# Patient Record
Sex: Male | Born: 1935 | Race: White | Hispanic: No | Marital: Married | Smoking: Former smoker
Health system: Southern US, Community
[De-identification: ages and names within clinical notes are randomized; demographics above are authoritative.]

## PROBLEM LIST (undated history)

## (undated) DIAGNOSIS — I10 Essential (primary) hypertension: Secondary | ICD-10-CM

## (undated) DIAGNOSIS — I4891 Unspecified atrial fibrillation: Secondary | ICD-10-CM

## (undated) DIAGNOSIS — E119 Type 2 diabetes mellitus without complications: Secondary | ICD-10-CM

## (undated) DIAGNOSIS — N400 Enlarged prostate without lower urinary tract symptoms: Secondary | ICD-10-CM

## (undated) HISTORY — DX: Essential (primary) hypertension: I10

## (undated) HISTORY — PX: PACEMAKER INSERTION: SHX728

## (undated) HISTORY — PX: TOE AMPUTATION: SHX809

## (undated) HISTORY — DX: Type 2 diabetes mellitus without complications: E11.9

## (undated) HISTORY — PX: JOINT REPLACEMENT: SHX530

---

## 2004-10-29 ENCOUNTER — Ambulatory Visit: Payer: Self-pay

## 2004-11-05 ENCOUNTER — Ambulatory Visit: Payer: Self-pay | Admitting: Specialist

## 2004-11-12 ENCOUNTER — Ambulatory Visit: Payer: Self-pay | Admitting: Specialist

## 2004-11-23 ENCOUNTER — Emergency Department: Payer: Self-pay | Admitting: Urology

## 2004-12-02 ENCOUNTER — Emergency Department: Payer: Self-pay | Admitting: Urology

## 2004-12-11 ENCOUNTER — Emergency Department: Payer: Self-pay | Admitting: Urology

## 2004-12-31 ENCOUNTER — Ambulatory Visit: Payer: Self-pay | Admitting: Specialist

## 2005-01-17 ENCOUNTER — Ambulatory Visit: Payer: Self-pay | Admitting: Gastroenterology

## 2008-03-06 ENCOUNTER — Ambulatory Visit: Payer: Self-pay | Admitting: Vascular Surgery

## 2008-05-31 ENCOUNTER — Ambulatory Visit: Payer: Self-pay | Admitting: Unknown Physician Specialty

## 2008-08-23 ENCOUNTER — Ambulatory Visit: Payer: Self-pay | Admitting: Family Medicine

## 2010-05-11 ENCOUNTER — Emergency Department: Payer: Self-pay | Admitting: Emergency Medicine

## 2010-08-05 ENCOUNTER — Ambulatory Visit: Payer: Self-pay | Admitting: Podiatry

## 2011-02-13 ENCOUNTER — Emergency Department: Payer: Self-pay | Admitting: *Deleted

## 2011-04-07 ENCOUNTER — Ambulatory Visit: Payer: Self-pay | Admitting: Vascular Surgery

## 2011-08-22 ENCOUNTER — Inpatient Hospital Stay: Payer: Self-pay | Admitting: Internal Medicine

## 2011-08-22 ENCOUNTER — Other Ambulatory Visit: Payer: Self-pay | Admitting: Podiatry

## 2011-08-22 LAB — BASIC METABOLIC PANEL
BUN: 27 mg/dL — ABNORMAL HIGH (ref 7–18)
Chloride: 98 mmol/L (ref 98–107)
Osmolality: 281 (ref 275–301)
Potassium: 4 mmol/L (ref 3.5–5.1)
Sodium: 137 mmol/L (ref 136–145)

## 2011-08-22 LAB — CBC WITH DIFFERENTIAL/PLATELET
Basophil #: 0 10*3/uL (ref 0.0–0.1)
Basophil %: 0 %
Eosinophil #: 0 10*3/uL (ref 0.0–0.7)
Eosinophil %: 0 %
Lymphocyte #: 0.3 10*3/uL — ABNORMAL LOW (ref 1.0–3.6)
MCHC: 33.8 g/dL (ref 32.0–36.0)
MCV: 86 fL (ref 80–100)
Monocyte %: 11.9 %
Neutrophil #: 13.4 10*3/uL — ABNORMAL HIGH (ref 1.4–6.5)
Neutrophil %: 85.9 %
Platelet: 252 10*3/uL (ref 150–440)
RDW: 15.6 % — ABNORMAL HIGH (ref 11.5–14.5)
WBC: 15.6 10*3/uL — ABNORMAL HIGH (ref 3.8–10.6)

## 2011-08-23 LAB — CBC WITH DIFFERENTIAL/PLATELET
Basophil #: 0 10*3/uL (ref 0.0–0.1)
Eosinophil #: 0 10*3/uL (ref 0.0–0.7)
Eosinophil %: 0.2 %
Lymphocyte #: 0.2 10*3/uL — ABNORMAL LOW (ref 1.0–3.6)
MCH: 28.6 pg (ref 26.0–34.0)
MCV: 86 fL (ref 80–100)
Monocyte #: 0.9 10*3/uL — ABNORMAL HIGH (ref 0.0–0.7)
Neutrophil #: 12.3 10*3/uL — ABNORMAL HIGH (ref 1.4–6.5)
Neutrophil %: 91.6 %
Platelet: 278 10*3/uL (ref 150–440)
RDW: 15.8 % — ABNORMAL HIGH (ref 11.5–14.5)
WBC: 13.4 10*3/uL — ABNORMAL HIGH (ref 3.8–10.6)

## 2011-08-23 LAB — HEPATIC FUNCTION PANEL A (ARMC)
Albumin: 1.1 g/dL — ABNORMAL LOW (ref 3.4–5.0)
Alkaline Phosphatase: 40 U/L — ABNORMAL LOW (ref 50–136)
SGOT(AST): 23 U/L (ref 15–37)
SGPT (ALT): 16 U/L
Total Protein: 4.8 g/dL — ABNORMAL LOW (ref 6.4–8.2)

## 2011-08-23 LAB — BASIC METABOLIC PANEL
Anion Gap: 10 (ref 7–16)
Co2: 30 mmol/L (ref 21–32)
Creatinine: 1.19 mg/dL (ref 0.60–1.30)
EGFR (African American): >60
Potassium: 3.4 mmol/L — ABNORMAL LOW (ref 3.5–5.1)
Sodium: 137 mmol/L (ref 136–145)

## 2011-08-24 LAB — CBC WITH DIFFERENTIAL/PLATELET
Basophil #: 0 10*3/uL (ref 0.0–0.1)
Basophil %: 0 %
Eosinophil %: 0.2 %
HCT: 31.1 % — ABNORMAL LOW (ref 40.0–52.0)
HGB: 10.2 g/dL — ABNORMAL LOW (ref 13.0–18.0)
Lymphocyte #: 0.2 10*3/uL — ABNORMAL LOW (ref 1.0–3.6)
Lymphocyte %: 1.4 %
MCH: 28.2 pg (ref 26.0–34.0)
MCHC: 32.7 g/dL (ref 32.0–36.0)
MCV: 86 fL (ref 80–100)
Monocyte #: 0.8 10*3/uL — ABNORMAL HIGH (ref 0.0–0.7)
Monocyte %: 5.3 %
Neutrophil %: 93.1 %
RBC: 3.61 10*6/uL — ABNORMAL LOW (ref 4.40–5.90)

## 2011-08-24 LAB — BASIC METABOLIC PANEL
Anion Gap: 11 (ref 7–16)
Calcium, Total: 7.8 mg/dL — ABNORMAL LOW (ref 8.5–10.1)
Co2: 31 mmol/L (ref 21–32)
Creatinine: 1.33 mg/dL — ABNORMAL HIGH (ref 0.60–1.30)
EGFR (African American): >60
EGFR (Non-African Amer.): 56 — ABNORMAL LOW
Glucose: 108 mg/dL — ABNORMAL HIGH (ref 65–99)
Osmolality: 281 (ref 275–301)

## 2011-08-24 LAB — VANCOMYCIN, TROUGH: Vancomycin, Trough: 11 ug/mL (ref 10–20)

## 2011-08-25 LAB — CBC WITH DIFFERENTIAL/PLATELET
Basophil #: 0 10*3/uL (ref 0.0–0.1)
Basophil %: 0.4 %
Eosinophil #: 0.1 10*3/uL (ref 0.0–0.7)
Eosinophil %: 0.7 %
HGB: 9.9 g/dL — ABNORMAL LOW (ref 13.0–18.0)
Lymphocyte #: 0.3 10*3/uL — ABNORMAL LOW (ref 1.0–3.6)
MCH: 28.3 pg (ref 26.0–34.0)
MCHC: 33.2 g/dL (ref 32.0–36.0)
MCV: 86 fL (ref 80–100)
Monocyte #: 1 10*3/uL — ABNORMAL HIGH (ref 0.0–0.7)
Neutrophil %: 89.2 %
Platelet: 324 10*3/uL (ref 150–440)
RBC: 3.5 10*6/uL — ABNORMAL LOW (ref 4.40–5.90)
RDW: 15.7 % — ABNORMAL HIGH (ref 11.5–14.5)

## 2011-08-25 LAB — BASIC METABOLIC PANEL
Anion Gap: 10 (ref 7–16)
BUN: 24 mg/dL — ABNORMAL HIGH (ref 7–18)
Co2: 31 mmol/L (ref 21–32)
EGFR (African American): >60
EGFR (Non-African Amer.): 56 — ABNORMAL LOW
Osmolality: 280 (ref 275–301)
Potassium: 3.4 mmol/L — ABNORMAL LOW (ref 3.5–5.1)

## 2011-08-26 LAB — BASIC METABOLIC PANEL
Anion Gap: 10 (ref 7–16)
BUN: 21 mg/dL — ABNORMAL HIGH (ref 7–18)
Chloride: 95 mmol/L — ABNORMAL LOW (ref 98–107)
Co2: 31 mmol/L (ref 21–32)
Creatinine: 1.21 mg/dL (ref 0.60–1.30)
EGFR (Non-African Amer.): >60
Osmolality: 276 (ref 275–301)
Potassium: 3.4 mmol/L — ABNORMAL LOW (ref 3.5–5.1)
Sodium: 136 mmol/L (ref 136–145)

## 2011-08-26 LAB — CBC WITH DIFFERENTIAL/PLATELET
Basophil #: 0 10*3/uL (ref 0.0–0.1)
Eosinophil %: 1.4 %
HCT: 29.2 % — ABNORMAL LOW (ref 40.0–52.0)
Lymphocyte #: 0.4 10*3/uL — ABNORMAL LOW (ref 1.0–3.6)
Lymphocyte %: 3.2 %
MCHC: 33 g/dL (ref 32.0–36.0)
MCV: 86 fL (ref 80–100)
Monocyte %: 7.1 %
Neutrophil #: 10.3 10*3/uL — ABNORMAL HIGH (ref 1.4–6.5)
RBC: 3.4 10*6/uL — ABNORMAL LOW (ref 4.40–5.90)
RDW: 15.8 % — ABNORMAL HIGH (ref 11.5–14.5)
WBC: 11.7 10*3/uL — ABNORMAL HIGH (ref 3.8–10.6)

## 2011-08-26 LAB — WOUND CULTURE

## 2011-08-27 LAB — CBC WITH DIFFERENTIAL/PLATELET
Basophil #: 0 10*3/uL (ref 0.0–0.1)
Basophil %: 0 %
Eosinophil #: 0.2 10*3/uL (ref 0.0–0.7)
Eosinophil %: 2.2 %
Lymphocyte #: 0.4 10*3/uL — ABNORMAL LOW (ref 1.0–3.6)
MCH: 28.4 pg (ref 26.0–34.0)
MCHC: 33.2 g/dL (ref 32.0–36.0)
MCV: 86 fL (ref 80–100)
Monocyte #: 0.9 10*3/uL — ABNORMAL HIGH (ref 0.0–0.7)
Platelet: 408 10*3/uL (ref 150–440)
RBC: 3.37 10*6/uL — ABNORMAL LOW (ref 4.40–5.90)

## 2011-08-27 LAB — BASIC METABOLIC PANEL
Anion Gap: 8 (ref 7–16)
BUN: 18 mg/dL (ref 7–18)
Calcium, Total: 7.7 mg/dL — ABNORMAL LOW (ref 8.5–10.1)
EGFR (African American): >60
EGFR (Non-African Amer.): 59 — ABNORMAL LOW
Glucose: 108 mg/dL — ABNORMAL HIGH (ref 65–99)
Osmolality: 273 (ref 275–301)
Potassium: 3.7 mmol/L (ref 3.5–5.1)
Sodium: 135 mmol/L — ABNORMAL LOW (ref 136–145)

## 2011-08-27 LAB — VANCOMYCIN, TROUGH: Vancomycin, Trough: 22 ug/mL (ref 10–20)

## 2011-08-28 LAB — CBC WITH DIFFERENTIAL/PLATELET
Basophil #: 0 10*3/uL (ref 0.0–0.1)
Basophil %: 0.3 %
HCT: 28.5 % — ABNORMAL LOW (ref 40.0–52.0)
Lymphocyte #: 0.3 10*3/uL — ABNORMAL LOW (ref 1.0–3.6)
MCH: 27.9 pg (ref 26.0–34.0)
MCHC: 33 g/dL (ref 32.0–36.0)
MCV: 85 fL (ref 80–100)
Monocyte #: 0.7 10*3/uL (ref 0.0–0.7)
Monocyte %: 7.9 %
Neutrophil #: 8.3 10*3/uL — ABNORMAL HIGH (ref 1.4–6.5)
RDW: 15.6 % — ABNORMAL HIGH (ref 11.5–14.5)
WBC: 9.4 10*3/uL (ref 3.8–10.6)

## 2011-08-28 LAB — BASIC METABOLIC PANEL
Anion Gap: 9 (ref 7–16)
Calcium, Total: 7.7 mg/dL — ABNORMAL LOW (ref 8.5–10.1)
Co2: 30 mmol/L (ref 21–32)
EGFR (African American): >60
Potassium: 3.7 mmol/L (ref 3.5–5.1)

## 2011-08-28 LAB — WOUND CULTURE

## 2011-08-29 LAB — CULTURE, BLOOD (SINGLE)

## 2011-10-17 ENCOUNTER — Ambulatory Visit: Payer: Self-pay | Admitting: Podiatry

## 2011-10-17 LAB — BASIC METABOLIC PANEL
Anion Gap: 6 — ABNORMAL LOW (ref 7–16)
BUN: 33 mg/dL — ABNORMAL HIGH (ref 7–18)
Calcium, Total: 9.1 mg/dL (ref 8.5–10.1)
Chloride: 96 mmol/L — ABNORMAL LOW (ref 98–107)
Creatinine: 1.45 mg/dL — ABNORMAL HIGH (ref 0.60–1.30)
EGFR (African American): >60
EGFR (Non-African Amer.): 50 — ABNORMAL LOW
Glucose: 98 mg/dL (ref 65–99)
Osmolality: 275 (ref 275–301)

## 2011-10-17 LAB — CBC WITH DIFFERENTIAL/PLATELET
Basophil #: 0 10*3/uL (ref 0.0–0.1)
Eosinophil #: 0.1 10*3/uL (ref 0.0–0.7)
HGB: 10.5 g/dL — ABNORMAL LOW (ref 13.0–18.0)
Lymphocyte %: 9.4 %
MCHC: 33.8 g/dL (ref 32.0–36.0)
MCV: 85 fL (ref 80–100)
Monocyte #: 0.5 10*3/uL (ref 0.0–0.7)
Neutrophil %: 79.5 %
RBC: 3.66 10*6/uL — ABNORMAL LOW (ref 4.40–5.90)
RDW: 14.3 % (ref 11.5–14.5)
WBC: 5.5 10*3/uL (ref 3.8–10.6)

## 2011-10-23 ENCOUNTER — Ambulatory Visit: Payer: Self-pay | Admitting: Podiatry

## 2011-11-17 ENCOUNTER — Ambulatory Visit: Payer: Self-pay | Admitting: Vascular Surgery

## 2011-11-17 LAB — BASIC METABOLIC PANEL
Chloride: 96 mmol/L — ABNORMAL LOW (ref 98–107)
EGFR (African American): 42 — ABNORMAL LOW
Glucose: 67 mg/dL (ref 65–99)
Osmolality: 277 (ref 275–301)
Potassium: 3.5 mmol/L (ref 3.5–5.1)
Sodium: 134 mmol/L — ABNORMAL LOW (ref 136–145)

## 2011-11-17 LAB — CBC
MCH: 28.5 pg (ref 26.0–34.0)
MCHC: 33.1 g/dL (ref 32.0–36.0)
MCV: 86 fL (ref 80–100)
RBC: 3.64 10*6/uL — ABNORMAL LOW (ref 4.40–5.90)
RDW: 14.7 % — ABNORMAL HIGH (ref 11.5–14.5)

## 2011-11-20 ENCOUNTER — Ambulatory Visit: Payer: Self-pay | Admitting: Vascular Surgery

## 2012-02-26 ENCOUNTER — Inpatient Hospital Stay: Payer: Self-pay | Admitting: Internal Medicine

## 2012-02-26 LAB — URINALYSIS, COMPLETE
Blood: NEGATIVE
Nitrite: NEGATIVE
Ph: 6 (ref 4.5–8.0)
Protein: NEGATIVE
Specific Gravity: 1.011 (ref 1.003–1.030)
WBC UR: <1 /HPF (ref 0–5)

## 2012-02-26 LAB — CBC WITH DIFFERENTIAL/PLATELET
Eosinophil %: 0.6 %
HGB: 11.1 g/dL — ABNORMAL LOW (ref 13.0–18.0)
Lymphocyte #: 0.3 10*3/uL — ABNORMAL LOW (ref 1.0–3.6)
MCH: 28.9 pg (ref 26.0–34.0)
MCHC: 34.6 g/dL (ref 32.0–36.0)
MCV: 84 fL (ref 80–100)
Monocyte #: 0.8 x10 3/mm (ref 0.2–1.0)
Neutrophil #: 10.3 10*3/uL — ABNORMAL HIGH (ref 1.4–6.5)
Neutrophil %: 89.2 %
Platelet: 256 10*3/uL (ref 150–440)
RBC: 3.84 10*6/uL — ABNORMAL LOW (ref 4.40–5.90)
RDW: 16.9 % — ABNORMAL HIGH (ref 11.5–14.5)

## 2012-02-26 LAB — COMPREHENSIVE METABOLIC PANEL
Albumin: 3.4 g/dL (ref 3.4–5.0)
Anion Gap: 9 (ref 7–16)
Calcium, Total: 8.7 mg/dL (ref 8.5–10.1)
Chloride: 94 mmol/L — ABNORMAL LOW (ref 98–107)
Co2: 33 mmol/L — ABNORMAL HIGH (ref 21–32)
EGFR (African American): 38 — ABNORMAL LOW
EGFR (Non-African Amer.): 33 — ABNORMAL LOW
Glucose: 184 mg/dL — ABNORMAL HIGH (ref 65–99)
Osmolality: 286 (ref 275–301)
Potassium: 3.5 mmol/L (ref 3.5–5.1)
SGOT(AST): 45 U/L — ABNORMAL HIGH (ref 15–37)
SGPT (ALT): 23 U/L

## 2012-02-26 LAB — DRUG SCREEN, URINE
Barbiturates, Ur Screen: NEGATIVE (ref ?–200)
MDMA (Ecstasy)Ur Screen: NEGATIVE (ref ?–500)
Methadone, Ur Screen: NEGATIVE (ref ?–300)
Opiate, Ur Screen: NEGATIVE (ref ?–300)
Phencyclidine (PCP) Ur S: NEGATIVE (ref ?–25)

## 2012-02-26 LAB — TSH: Thyroid Stimulating Horm: 0.952 u[IU]/mL

## 2012-02-26 LAB — ETHANOL: Ethanol %: <0.003 % (ref 0.000–0.080)

## 2012-02-27 LAB — CBC WITH DIFFERENTIAL/PLATELET
Basophil #: 0.1 10*3/uL (ref 0.0–0.1)
Basophil %: 0.4 %
HCT: 28.3 % — ABNORMAL LOW (ref 40.0–52.0)
Lymphocyte #: 0.6 10*3/uL — ABNORMAL LOW (ref 1.0–3.6)
MCH: 28.8 pg (ref 26.0–34.0)
MCHC: 34.6 g/dL (ref 32.0–36.0)
MCV: 83 fL (ref 80–100)
Monocyte #: 1.5 x10 3/mm — ABNORMAL HIGH (ref 0.2–1.0)
Monocyte %: 9.3 %
Neutrophil #: 14.4 10*3/uL — ABNORMAL HIGH (ref 1.4–6.5)
Platelet: 216 10*3/uL (ref 150–440)
RBC: 3.4 10*6/uL — ABNORMAL LOW (ref 4.40–5.90)
RDW: 17.1 % — ABNORMAL HIGH (ref 11.5–14.5)
WBC: 16.6 10*3/uL — ABNORMAL HIGH (ref 3.8–10.6)

## 2012-02-27 LAB — BASIC METABOLIC PANEL
Anion Gap: 13 (ref 7–16)
Calcium, Total: 8 mg/dL — ABNORMAL LOW (ref 8.5–10.1)
Chloride: 97 mmol/L — ABNORMAL LOW (ref 98–107)
Co2: 28 mmol/L (ref 21–32)
Creatinine: 1.76 mg/dL — ABNORMAL HIGH (ref 0.60–1.30)
EGFR (African American): 42 — ABNORMAL LOW

## 2012-02-28 LAB — CBC WITH DIFFERENTIAL/PLATELET
Basophil #: 0 10*3/uL (ref 0.0–0.1)
Eosinophil #: 0.1 10*3/uL (ref 0.0–0.7)
Lymphocyte #: 0.4 10*3/uL — ABNORMAL LOW (ref 1.0–3.6)
MCH: 28.9 pg (ref 26.0–34.0)
MCHC: 34.3 g/dL (ref 32.0–36.0)
MCV: 84 fL (ref 80–100)
Monocyte #: 1 x10 3/mm (ref 0.2–1.0)
Platelet: 199 10*3/uL (ref 150–440)
RDW: 17.2 % — ABNORMAL HIGH (ref 11.5–14.5)

## 2012-02-28 LAB — MAGNESIUM: Magnesium: 2 mg/dL

## 2012-02-28 LAB — BASIC METABOLIC PANEL
Anion Gap: 8 (ref 7–16)
BUN: 19 mg/dL — ABNORMAL HIGH (ref 7–18)
Calcium, Total: 8.3 mg/dL — ABNORMAL LOW (ref 8.5–10.1)
Creatinine: 1.31 mg/dL — ABNORMAL HIGH (ref 0.60–1.30)
Glucose: 162 mg/dL — ABNORMAL HIGH (ref 65–99)
Potassium: 3.6 mmol/L (ref 3.5–5.1)
Sodium: 135 mmol/L — ABNORMAL LOW (ref 136–145)

## 2012-02-29 LAB — BASIC METABOLIC PANEL
BUN: 15 mg/dL (ref 7–18)
Chloride: 102 mmol/L (ref 98–107)
Creatinine: 1.19 mg/dL (ref 0.60–1.30)
EGFR (African American): >60
EGFR (Non-African Amer.): 59 — ABNORMAL LOW
Glucose: 173 mg/dL — ABNORMAL HIGH (ref 65–99)
Osmolality: 273 (ref 275–301)
Sodium: 134 mmol/L — ABNORMAL LOW (ref 136–145)

## 2012-02-29 LAB — CBC WITH DIFFERENTIAL/PLATELET
Basophil #: 0 10*3/uL (ref 0.0–0.1)
Eosinophil #: 0.1 10*3/uL (ref 0.0–0.7)
HCT: 26.5 % — ABNORMAL LOW (ref 40.0–52.0)
Lymphocyte #: 0.2 10*3/uL — ABNORMAL LOW (ref 1.0–3.6)
MCHC: 33.5 g/dL (ref 32.0–36.0)
MCV: 84 fL (ref 80–100)
Monocyte #: 0.6 x10 3/mm (ref 0.2–1.0)
Neutrophil #: 12.3 10*3/uL — ABNORMAL HIGH (ref 1.4–6.5)
Platelet: 197 10*3/uL (ref 150–440)
RDW: 17 % — ABNORMAL HIGH (ref 11.5–14.5)

## 2012-03-01 LAB — BASIC METABOLIC PANEL
Anion Gap: 9 (ref 7–16)
BUN: 15 mg/dL (ref 7–18)
Chloride: 103 mmol/L (ref 98–107)
Co2: 23 mmol/L (ref 21–32)
Creatinine: 1.26 mg/dL (ref 0.60–1.30)
Sodium: 135 mmol/L — ABNORMAL LOW (ref 136–145)

## 2012-03-01 LAB — CBC WITH DIFFERENTIAL/PLATELET
Basophil #: 0 10*3/uL (ref 0.0–0.1)
Basophil %: 0.2 %
Eosinophil %: 0.2 %
HCT: 26.5 % — ABNORMAL LOW (ref 40.0–52.0)
Lymphocyte #: 0.3 10*3/uL — ABNORMAL LOW (ref 1.0–3.6)
Lymphocyte %: 2.1 %
MCH: 28.6 pg (ref 26.0–34.0)
MCHC: 34.1 g/dL (ref 32.0–36.0)
Monocyte %: 6.1 %
Neutrophil #: 11.9 10*3/uL — ABNORMAL HIGH (ref 1.4–6.5)
Neutrophil %: 91.4 %
Platelet: 215 10*3/uL (ref 150–440)
RBC: 3.17 10*6/uL — ABNORMAL LOW (ref 4.40–5.90)
RDW: 17.2 % — ABNORMAL HIGH (ref 11.5–14.5)

## 2012-03-02 LAB — CBC WITH DIFFERENTIAL/PLATELET
Basophil %: 0.4 %
Eosinophil #: 0.2 10*3/uL (ref 0.0–0.7)
Eosinophil %: 1.5 %
HCT: 26.2 % — ABNORMAL LOW (ref 40.0–52.0)
Lymphocyte #: 0.3 10*3/uL — ABNORMAL LOW (ref 1.0–3.6)
MCH: 28.9 pg (ref 26.0–34.0)
MCHC: 34.5 g/dL (ref 32.0–36.0)
MCV: 84 fL (ref 80–100)
Monocyte #: 0.7 x10 3/mm (ref 0.2–1.0)
Neutrophil #: 10.9 10*3/uL — ABNORMAL HIGH (ref 1.4–6.5)
Neutrophil %: 90 %
Platelet: 234 10*3/uL (ref 150–440)
RBC: 3.12 10*6/uL — ABNORMAL LOW (ref 4.40–5.90)

## 2012-03-02 LAB — BASIC METABOLIC PANEL
Anion Gap: 12 (ref 7–16)
Co2: 21 mmol/L (ref 21–32)
EGFR (African American): >60
Glucose: 147 mg/dL — ABNORMAL HIGH (ref 65–99)
Potassium: 4.1 mmol/L (ref 3.5–5.1)
Sodium: 138 mmol/L (ref 136–145)

## 2012-03-03 LAB — CBC WITH DIFFERENTIAL/PLATELET
Basophil #: 0 10*3/uL (ref 0.0–0.1)
Basophil %: 0.4 %
Eosinophil %: 1.9 %
HCT: 26.6 % — ABNORMAL LOW (ref 40.0–52.0)
HGB: 9 g/dL — ABNORMAL LOW (ref 13.0–18.0)
MCH: 28.5 pg (ref 26.0–34.0)
MCHC: 33.9 g/dL (ref 32.0–36.0)
MCV: 84 fL (ref 80–100)
Monocyte #: 0.6 x10 3/mm (ref 0.2–1.0)
Neutrophil %: 88.9 %
RBC: 3.17 10*6/uL — ABNORMAL LOW (ref 4.40–5.90)
RDW: 17.2 % — ABNORMAL HIGH (ref 11.5–14.5)

## 2012-03-03 LAB — BASIC METABOLIC PANEL
Anion Gap: 8 (ref 7–16)
Calcium, Total: 8.4 mg/dL — ABNORMAL LOW (ref 8.5–10.1)
Chloride: 103 mmol/L (ref 98–107)
Co2: 25 mmol/L (ref 21–32)
Creatinine: 1.3 mg/dL (ref 0.60–1.30)
EGFR (African American): >60
Glucose: 165 mg/dL — ABNORMAL HIGH (ref 65–99)
Osmolality: 277 (ref 275–301)
Potassium: 3.8 mmol/L (ref 3.5–5.1)

## 2012-03-03 LAB — CULTURE, BLOOD (SINGLE)

## 2012-03-04 LAB — BASIC METABOLIC PANEL
Anion Gap: 10 (ref 7–16)
BUN: 18 mg/dL (ref 7–18)
Chloride: 103 mmol/L (ref 98–107)
Creatinine: 1.23 mg/dL (ref 0.60–1.30)
EGFR (Non-African Amer.): 56 — ABNORMAL LOW
Glucose: 129 mg/dL — ABNORMAL HIGH (ref 65–99)
Osmolality: 277 (ref 275–301)
Potassium: 3.9 mmol/L (ref 3.5–5.1)

## 2012-03-04 LAB — VANCOMYCIN, TROUGH: Vancomycin, Trough: 17 ug/mL (ref 10–20)

## 2012-03-04 LAB — CBC WITH DIFFERENTIAL/PLATELET
Basophil #: 0.1 10*3/uL (ref 0.0–0.1)
Basophil %: 0.6 %
Eosinophil #: 0.2 10*3/uL (ref 0.0–0.7)
HCT: 26.1 % — ABNORMAL LOW (ref 40.0–52.0)
HGB: 8.8 g/dL — ABNORMAL LOW (ref 13.0–18.0)
Lymphocyte #: 0.3 10*3/uL — ABNORMAL LOW (ref 1.0–3.6)
MCH: 28.3 pg (ref 26.0–34.0)
MCHC: 33.8 g/dL (ref 32.0–36.0)
MCV: 84 fL (ref 80–100)
Monocyte #: 0.7 x10 3/mm (ref 0.2–1.0)
Neutrophil %: 86.8 %
Platelet: 310 10*3/uL (ref 150–440)
RBC: 3.11 10*6/uL — ABNORMAL LOW (ref 4.40–5.90)

## 2012-03-05 LAB — CBC WITH DIFFERENTIAL/PLATELET
Basophil #: 0 10*3/uL (ref 0.0–0.1)
Basophil %: 0.5 %
Eosinophil #: 0.2 10*3/uL (ref 0.0–0.7)
Eosinophil %: 2.1 %
HCT: 25.4 % — ABNORMAL LOW (ref 40.0–52.0)
HGB: 8.9 g/dL — ABNORMAL LOW (ref 13.0–18.0)
Lymphocyte #: 0.3 10*3/uL — ABNORMAL LOW (ref 1.0–3.6)
Lymphocyte %: 3.1 %
MCH: 29.2 pg (ref 26.0–34.0)
MCHC: 34.9 g/dL (ref 32.0–36.0)
MCV: 84 fL (ref 80–100)
Monocyte #: 0.6 x10 3/mm (ref 0.2–1.0)
Monocyte %: 6.6 %
Neutrophil #: 7.8 10*3/uL — ABNORMAL HIGH (ref 1.4–6.5)
Neutrophil %: 87.7 %
Platelet: 357 10*3/uL (ref 150–440)
RBC: 3.05 10*6/uL — ABNORMAL LOW (ref 4.40–5.90)
RDW: 17.2 % — ABNORMAL HIGH (ref 11.5–14.5)
WBC: 9 10*3/uL (ref 3.8–10.6)

## 2012-03-05 LAB — BASIC METABOLIC PANEL
BUN: 15 mg/dL (ref 7–18)
Co2: 25 mmol/L (ref 21–32)
EGFR (African American): >60
EGFR (Non-African Amer.): 59 — ABNORMAL LOW
Glucose: 116 mg/dL — ABNORMAL HIGH (ref 65–99)
Osmolality: 279 (ref 275–301)
Potassium: 4 mmol/L (ref 3.5–5.1)

## 2012-03-05 LAB — VANCOMYCIN, TROUGH: Vancomycin, Trough: 18 ug/mL (ref 10–20)

## 2012-03-06 LAB — CBC WITH DIFFERENTIAL/PLATELET
Basophil %: 0.5 %
Eosinophil #: 0.2 10*3/uL (ref 0.0–0.7)
HGB: 9.2 g/dL — ABNORMAL LOW (ref 13.0–18.0)
MCH: 28.6 pg (ref 26.0–34.0)
MCHC: 34 g/dL (ref 32.0–36.0)
Monocyte #: 0.5 x10 3/mm (ref 0.2–1.0)
Monocyte %: 5.5 %
Neutrophil #: 8.6 10*3/uL — ABNORMAL HIGH (ref 1.4–6.5)
Neutrophil %: 88.2 %
RDW: 17.3 % — ABNORMAL HIGH (ref 11.5–14.5)
WBC: 9.7 10*3/uL (ref 3.8–10.6)

## 2012-03-06 LAB — BASIC METABOLIC PANEL
BUN: 18 mg/dL (ref 7–18)
Calcium, Total: 8.4 mg/dL — ABNORMAL LOW (ref 8.5–10.1)
Chloride: 104 mmol/L (ref 98–107)
Creatinine: 1.28 mg/dL (ref 0.60–1.30)
EGFR (Non-African Amer.): 54 — ABNORMAL LOW
Glucose: 125 mg/dL — ABNORMAL HIGH (ref 65–99)
Osmolality: 279 (ref 275–301)
Potassium: 4.2 mmol/L (ref 3.5–5.1)
Sodium: 138 mmol/L (ref 136–145)

## 2012-03-08 LAB — WOUND CULTURE

## 2012-03-11 ENCOUNTER — Other Ambulatory Visit: Payer: Self-pay | Admitting: Podiatry

## 2012-05-31 ENCOUNTER — Emergency Department: Payer: Self-pay | Admitting: Emergency Medicine

## 2012-05-31 LAB — URINALYSIS, COMPLETE
Bilirubin,UR: NEGATIVE
Glucose,UR: NEGATIVE mg/dL (ref 0–75)
Leukocyte Esterase: NEGATIVE
Nitrite: NEGATIVE
RBC,UR: 34 /HPF (ref 0–5)
Specific Gravity: 1.014 (ref 1.003–1.030)

## 2012-06-01 ENCOUNTER — Emergency Department: Payer: Self-pay | Admitting: Emergency Medicine

## 2012-07-15 ENCOUNTER — Other Ambulatory Visit: Payer: Self-pay | Admitting: Podiatry

## 2012-07-15 ENCOUNTER — Inpatient Hospital Stay: Payer: Self-pay | Admitting: Podiatry

## 2012-07-15 LAB — BASIC METABOLIC PANEL
Anion Gap: 4 — ABNORMAL LOW (ref 7–16)
BUN: 28 mg/dL — ABNORMAL HIGH (ref 7–18)
Calcium, Total: 9.4 mg/dL (ref 8.5–10.1)
Chloride: 95 mmol/L — ABNORMAL LOW (ref 98–107)
Co2: 33 mmol/L — ABNORMAL HIGH (ref 21–32)
Creatinine: 1.82 mg/dL — ABNORMAL HIGH (ref 0.60–1.30)
EGFR (African American): 41 — ABNORMAL LOW
EGFR (Non-African Amer.): 35 — ABNORMAL LOW
Osmolality: 273 (ref 275–301)
Sodium: 132 mmol/L — ABNORMAL LOW (ref 136–145)

## 2012-07-15 LAB — CBC WITH DIFFERENTIAL/PLATELET
Basophil %: 0.6 %
Eosinophil %: 0.1 %
HGB: 10.5 g/dL — ABNORMAL LOW (ref 13.0–18.0)
Lymphocyte #: 0.4 10*3/uL — ABNORMAL LOW (ref 1.0–3.6)
Lymphocyte %: 3 %
MCH: 29.9 pg (ref 26.0–34.0)
MCV: 86 fL (ref 80–100)
Monocyte #: 1.2 x10 3/mm — ABNORMAL HIGH (ref 0.2–1.0)
Monocyte %: 8.3 %
Neutrophil %: 88 %
Platelet: 263 10*3/uL (ref 150–440)
RBC: 3.52 10*6/uL — ABNORMAL LOW (ref 4.40–5.90)
RDW: 15.2 % — ABNORMAL HIGH (ref 11.5–14.5)
WBC: 14.3 10*3/uL — ABNORMAL HIGH (ref 3.8–10.6)

## 2012-07-17 LAB — CBC WITH DIFFERENTIAL/PLATELET
Basophil %: 1.5 %
Eosinophil #: 0.1 10*3/uL (ref 0.0–0.7)
Eosinophil %: 2 %
HCT: 27 % — ABNORMAL LOW (ref 40.0–52.0)
HGB: 9.2 g/dL — ABNORMAL LOW (ref 13.0–18.0)
Lymphocyte #: 0.3 10*3/uL — ABNORMAL LOW (ref 1.0–3.6)
MCH: 29.5 pg (ref 26.0–34.0)
MCHC: 34.1 g/dL (ref 32.0–36.0)
MCV: 87 fL (ref 80–100)
Monocyte #: 0.5 x10 3/mm (ref 0.2–1.0)
Neutrophil #: 5 10*3/uL (ref 1.4–6.5)
Neutrophil %: 83 %
RBC: 3.12 10*6/uL — ABNORMAL LOW (ref 4.40–5.90)

## 2012-07-17 LAB — BASIC METABOLIC PANEL
Anion Gap: 9 (ref 7–16)
BUN: 22 mg/dL — ABNORMAL HIGH (ref 7–18)
Chloride: 103 mmol/L (ref 98–107)
Creatinine: 1.2 mg/dL (ref 0.60–1.30)
EGFR (Non-African Amer.): 58 — ABNORMAL LOW
Osmolality: 279 (ref 275–301)
Potassium: 3.3 mmol/L — ABNORMAL LOW (ref 3.5–5.1)

## 2012-07-18 LAB — CBC WITH DIFFERENTIAL/PLATELET
Basophil #: 0.1 10*3/uL (ref 0.0–0.1)
Basophil %: 1.3 %
Eosinophil %: 4.1 %
HGB: 10.2 g/dL — ABNORMAL LOW (ref 13.0–18.0)
Lymphocyte #: 0.4 10*3/uL — ABNORMAL LOW (ref 1.0–3.6)
MCH: 29.9 pg (ref 26.0–34.0)
MCV: 87 fL (ref 80–100)
Monocyte #: 0.5 x10 3/mm (ref 0.2–1.0)
Monocyte %: 10.1 %
Neutrophil #: 3.6 10*3/uL (ref 1.4–6.5)
Platelet: 302 10*3/uL (ref 150–440)
RBC: 3.4 10*6/uL — ABNORMAL LOW (ref 4.40–5.90)

## 2012-07-18 LAB — BASIC METABOLIC PANEL
Anion Gap: 8 (ref 7–16)
BUN: 20 mg/dL — ABNORMAL HIGH (ref 7–18)
Calcium, Total: 8.9 mg/dL (ref 8.5–10.1)
Chloride: 101 mmol/L (ref 98–107)
Creatinine: 1.37 mg/dL — ABNORMAL HIGH (ref 0.60–1.30)
EGFR (Non-African Amer.): 49 — ABNORMAL LOW
Glucose: 187 mg/dL — ABNORMAL HIGH (ref 65–99)
Osmolality: 278 (ref 275–301)
Potassium: 3.7 mmol/L (ref 3.5–5.1)

## 2012-07-19 LAB — BASIC METABOLIC PANEL
Calcium, Total: 9.1 mg/dL (ref 8.5–10.1)
Co2: 27 mmol/L (ref 21–32)
EGFR (African American): >60
EGFR (Non-African Amer.): 52 — ABNORMAL LOW
Osmolality: 279 (ref 275–301)
Sodium: 137 mmol/L (ref 136–145)

## 2012-07-19 LAB — CBC WITH DIFFERENTIAL/PLATELET
Basophil #: 0.1 10*3/uL (ref 0.0–0.1)
Eosinophil #: 0.2 10*3/uL (ref 0.0–0.7)
HCT: 29.9 % — ABNORMAL LOW (ref 40.0–52.0)
Lymphocyte #: 0.5 10*3/uL — ABNORMAL LOW (ref 1.0–3.6)
Lymphocyte %: 10.1 %
MCHC: 33.9 g/dL (ref 32.0–36.0)
MCV: 87 fL (ref 80–100)
Monocyte #: 0.4 x10 3/mm (ref 0.2–1.0)
Monocyte %: 8.3 %
Neutrophil #: 4.1 10*3/uL (ref 1.4–6.5)
Neutrophil %: 77.3 %
Platelet: 346 10*3/uL (ref 150–440)
RDW: 15.2 % — ABNORMAL HIGH (ref 11.5–14.5)

## 2012-07-19 LAB — WOUND CULTURE

## 2012-07-19 LAB — SEDIMENTATION RATE: Erythrocyte Sed Rate: 78 mm/hr — ABNORMAL HIGH (ref 0–20)

## 2012-07-20 LAB — WOUND CULTURE

## 2012-08-25 ENCOUNTER — Other Ambulatory Visit: Payer: Self-pay | Admitting: Podiatry

## 2012-12-22 IMAGING — NM NUCLEAR MEDICINE THREE PHASE BONE SCAN
3 series · 14 of 14 positions shown · non-contrast
Comparison: none

REASON FOR EXAM: part 2
COMMENTS:

[Series 1000: bone statics · 2.40mm/px · 2 of 2 frames shown]
[frame 1/2]
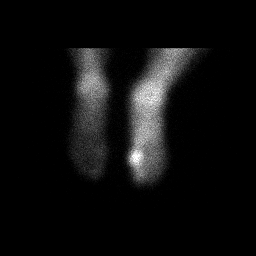
[frame 2/2]
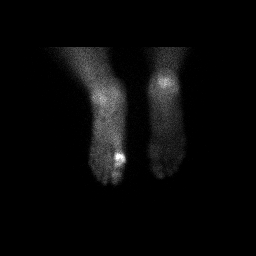

[Series 1000: bone (recon - ac ) · 4.8mm · 4.80mm/px · 6 of 62 frames shown]
[frame 6/62]
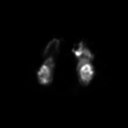
[frame 16/62]
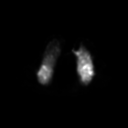
[frame 26/62]
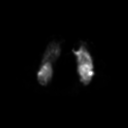
[frame 37/62]
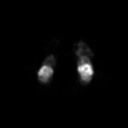
[frame 47/62]
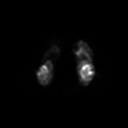
[frame 57/62]
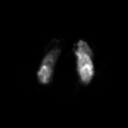

[Series 1000: bone · 4.80mm/px · 6 of 160 frames shown]
[frame 14/160]
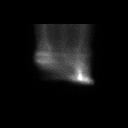
[frame 40/160]
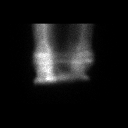
[frame 67/160]
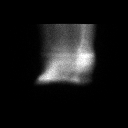
[frame 94/160]
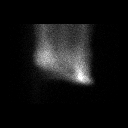
[frame 120/160]
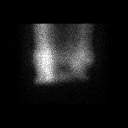
[frame 147/160]
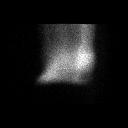

[14 of 14 positions shown; findings below may reference images not displayed]

PROCEDURE:     NM  - NM BONE IMAGING 3 PHASE STUDY  - July 15, 2012  [DATE]

RESULT:     The patient is being evaluated for osteomyelitis of the first
metatarsophalangeal joint on the left. The patient received 19.96 mCi of
technetium 99m labeled MDP for this study. There was patient motion during
the CT the acquisition which precluded fusion of the images with the nuclear
images.

On the flow images there is generalized increased delivery of the
radiopharmaceutical to the left foot. In addition slightly more activity is
noted along the medial aspect of the first tarsometatarsal region. On the
blood pool images there is focal increased uptake in the region of the first
metatarsophalangeal joint on the left and there is a small amount of
increased activity in the second toe. On delayed images intensely increased
uptake of the radiopharmaceutical is seen in the left first
metatarsophalangeal joint with minimal uptake in the interphalangeal joint.

The CT images do not reveal destructive bony changes of the first
metatarsophalangeal joint but the images are limited due to patient motion.
IMPRESSION: This three-phase nuclear bone scan reveals findings
consistent with osteomyelitis involving the first metatarsophalangeal joint
of the left foot. I do not see definite evidence of a pattern of infection
elsewhere.

[REDACTED]

## 2013-08-24 ENCOUNTER — Ambulatory Visit: Payer: Self-pay | Admitting: Unknown Physician Specialty

## 2013-08-26 LAB — PATHOLOGY REPORT

## 2014-02-16 ENCOUNTER — Emergency Department: Payer: Self-pay | Admitting: Emergency Medicine

## 2014-11-14 NOTE — Consult Note (Signed)
PATIENT NAME:  Harold Cervantes, Harold Cervantes MR#:  [id number] DATE OF BIRTH:  [birth year]  DATE OF CONSULTATION:  07/19/2012  REFERRING PHYSICIAN:  Dr. Orland Jarred.   CONSULTING PHYSICIAN:  Rosalyn Gess. Tonetta Napoles, MD  REASON FOR CONSULTATION: Osteomyelitis with methicillin-resistant Staphylococcus aureus.   HISTORY OF PRESENT ILLNESS: The patient is a 81 year old man with a past history significant for diabetes with neuropathy, bilateral heel fractures, who was admitted on 07/15/2012, with swelling and redness of his left great toe and forefoot. He states he has had problems with his feet for over a year. He has had prior surgery with removal of hardware after his initial ORIF of the heels. He had infection on the right foot and was being seen for a plantar ulcer on the left foot and was noted to have significant erythema, and was admitted to the hospital. He was evaluated by podiatry and underwent a first ray resection amputation of the left foot on 07/16/2012. In conversation with Dr. Orland Jarred, the infection was mainly distal. There was no evidence for tracking of the infection more proximally. The patient has been doing well since the surgery. He is currently on meropenem and vancomycin. Cultures have grown MRSA. His white count has been normal but was elevated on admission and has come down to normal.   ALLERGIES: INCLUDE Actos, Ambien, penicillin.   PAST MEDICAL HISTORY:  1.  Diabetes with neuropathy.  2.  Hypertension.  3.  Atrial fibrillation.  4.  Hypertriglyceridemia.  5.  Colonic polyps.  6.  Status post pacemaker placement.  7.  Bilateral heel fractures status post open reduction internal fixation in 1986.  8.  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  9.  Right heel abscess in August of 2013, status post surgical exploration with removal of the  staple.   SOCIAL HISTORY: The patient lives with his wife. He does not smoke. He does not drink. No history of injecting drug use.   FAMILY HISTORY:  Noncontributory.   Review of systems:  GENERAL: No fevers, chills, sweats, or malaise.  HEAD, EYES, EARS, NOSE, AND THROAT: No symptoms.  RESPIRATORY: No cough or shortness of breath.  CARDIAC: No chest pains or palpitations.  Gastrointestinal: No nausea, no vomiting, no abdominal pain, no change in his bowels. He has chronic constipation.  GENITOURINARY: No change in his urine.  MUSCULOSKELETAL: He has had swelling and pain in the left great toe with an ulcer under the great toe. He has had some blisters on the right foot that have been treated with unna boot wrapping.  SKIN: He has had the rash, as described above on the left foot.  NEUROLOGIC: Positive neuropathy.   All other systems are negative.   PHYSICAL EXAMINATION:  VITAL SIGNS: T-max of 99.2, T-current of 97.6, pulse 92, blood pressure 114/71, 93% on room air.  GENERAL: A 81 year old white man in no acute distress.  HEAD, EYES, EARS, NOSE, AND THROAT: Normocephalic, atraumatic.  CHEST: Clear to auscultation bilaterally with good air movement. No focal consolidation.  CARDIAC: Regular rate and rhythm without murmur, rub, or gallop.  ABDOMEN: Soft, nontender, and nondistended. No hepatosplenomegaly. No hernia is noted.  EXTREMITIES: The left foot was wrapped in bandages and was not directly observed. There was an unna boot on the right foot. There are some chronic stasis changes bilaterally, left greater than right. There was no evidence for lymphangitic streaking.  NEUROLOGIC: The patient was awake and interactive, moving all four extremities.  PSYCHIATRIC: Mood and affect appeared  normal.   Laboratory, diagnostic and radiological data: BUN 18, creatinine 1.31, bicarbonate 27, anion gap of 6. White count of 5.3 with a hemoglobin 10.2, platelet count of 346. ANC of 4.1. White count on admission was 14.3. Wound culture on admission grew MRSA. Intraoperative bone culture from the left great toe grew MRSA. A bone scan from admission  showed osteomyelitis involving the first metatarsophalangeal joint of the left foot.   IMPRESSION: A 81 year old man with a past history significant for diabetic neuropathy admitted with methicillin-resistant Staphylococcus aureus osteomyelitis of the left great toe status post ray amputation.   RECOMMENDATIONS:  1.  He has osteomyelitis and has undergone ray amputation. In talking with Dr. Orland Jarredroxler, the infection was only noted to be distal. The margin of bone removed was fairly significant.  Culture is growing methicillin-resistant Staphylococcus aureus.  2.  Will change him to trimethoprim sulfamethoxazole orally as the surgical margin was fairly large.  3.  Will plan on treating for about four weeks.  4.  We will check a C-reactive protein and sedimentation rate today to follow as an outpatient.  5.  I will plan on seeing him in two weeks to recheck his blood work.  6.  Would continue isolation while inhouse for methicillin-resistant Staphylococcus aureus.    This is a low level infectious disease consult.   Thank you much for involving me in Harold Cervantes's care.    ____________________________ Rosalyn GessMichael E. Gavyn Ybarra, MD meb:th D: 07/19/2012 15:15:58 ET T: 07/19/2012 22:14:31 ET JOB#: [id number]  cc: Rosalyn GessMichael E. Arlen Legendre, MD, <Dictator> Jaree Trinka E Khai Torbert MD ELECTRONICALLY SIGNED 07/20/2012 9:15

## 2014-11-14 NOTE — Consult Note (Signed)
Impression: 82yo male w/ h/o DM with neuropathy admitted with Methacillin Resistant Staph aureus osteomyelitis of the left great toe, s/p ray amputation.  He had osteomyelitis and has undergone ray amputation.  In talking with Dr. Elvina Mattes, the infection was only noted to be distal.  The margin of bone removed was significant.  Culture is growing Methacillin Resistant Staph aureus. Will change him to TMP/SMX orally as the surgical margin was large. Will plan on treating for about 4 weeks. Will check CRP and ESR to follow as an outpt. I will see him in 2 weeks to check blood work. Continue contact isolation while in house.  Electronic Signatures: Kayvan Hoefling, Heinz Knuckles (MD)  (Signed on 23-Dec-13 14:50)  Authored  Last Updated: 23-Dec-13 14:50 by Katha Kuehne, Heinz Knuckles (MD)

## 2014-11-14 NOTE — Consult Note (Signed)
PATIENT NAME:  Harold Cervantes, Harold Cervantes MR#:  [id number] DATE OF BIRTH:  1933/05/27  DATE OF CONSULTATION:  07/15/2012  REFERRING PHYSICIAN:  Rhona Raider. Troxler, DPM CONSULTING PHYSICIAN:  Ryliegh Mcduffey R. Dawsen Krieger, MD  PRIMARY CARE PHYSICIAN: Marya Amsler. Dareen Piano, MD   REASON FOR CONSULTATION: Diabetes, acute renal failure.   CHIEF COMPLAINT: Left foot ulcer, possible osteomyelitis.   HISTORY OF PRESENT ILLNESS: A 81 year old Caucasian male patient with history of uncontrolled diabetes mellitus, CKD stage III, atrial fibrillation on Pradaxa with hypertension, who has had recent staple removal from a right foot ulcer, which has healed, but presently in the hospital for a nuclear scan and possible debridement or amputation of the left foot. The patient has been admitted on the podiatry service and medical team has been consulted. The patient has had redness, some chills and pain in his left foot. Was on Levaquin as an outpatient, to which he has not responded. Presently has been started on vancomycin and ciprofloxacin in the hospital. The cultures from his foot have been sent as outpatient and results are awaited. The case has been discussed with Dr. Orland Jarred of podiatry.   The patient was in the hospital in August 2013 for a right heel ulcer, where an infected staple was removed. Cultures were negative. Was sent home on oral antibiotics for 2 weeks and has healed well.   PAST MEDICAL HISTORY:  1.  Hypertension. 2.  Type 2 diabetes with neuropathy.  3.  Atrial fibrillation, on Pradaxa.  4.  Positive PPD, untreated.  5.  Hypertriglyceridemia.  6.  Impotence.  7.  Colon polyps.  8.  Chronic right heel ulcer and left foot ulcer.  9.  Peripheral vascular disease.  PAST SURGICAL HISTORY:  1.  Open reduction and internal fixation of bilateral femur fractures.  2.  Pacemaker insertion.  3.  Removal of bone graft of left great toe.  4.  Skin graft of left foot wound.  5.  Staple removal from right heel ulcer.    ALLERGIES: PENICILLIN, POTASSIUM, ACTOS AND AMBIEN.   FAMILY HISTORY: Has been reviewed and unknown.   SOCIAL HISTORY: The patient is married, lives at home. Does not smoke. No alcohol. No illicit drugs.   REVIEW OF SYSTEMS: CONSTITUTIONAL: Complains of some fatigue. No fever, but has had chills. No weight loss, weight gain.  EYES: No blurred vision, double vision, pain.  EARS, NOSE, THROAT: No tinnitus, ear pain, hearing loss.  RESPIRATORY: No cough, wheezing, hemoptysis, dyspnea.  CARDIOVASCULAR: No chest pain, orthopnea, edema.  GASTROINTESTINAL: No nausea, vomiting, diarrhea, abdominal pain.  GENITOURINARY: No dysuria, hematuria.  ENDOCRINE: No polyuria, polydipsia, thyroid problems. Has diabetes.  HEMATOLOGIC: Has anemia of chronic disease. No easy bruising, bleeding.  INTEGUMENTARY: Has chronic ulcers in the right and left foot.  MUSCULOSKELETAL: No neck pain, shoulder pain, joint pain.  NEUROLOGIC: Has peripheral neuropathy. No dysarthria or focal weakness.  PSYCHIATRIC: No anxiety or depression.   HOME MEDICATIONS: Include: 1.  Avodart oral once a day.  2.  Carbamazepine 200 mg oral 2 times a day.  3.  Cardizem 180 mg oral once a day.  4.  Fenofibrate 160 mg oral once a day.  5.  Gabapentin 300 mg oral 2 times a day.  6.  Glipizide 2.5 mg oral once a day.  7.  Magnesium oxide 400 mg oral 2 times a day.  8.  Metolazone 5 mg 1/2 tablet oral once a day.  9.  Metoprolol 25 mg oral 2 times a day.  10.  Pradaxa 150 mg oral 2 times a day.  11.  Simvastatin 40 mg oral once a day.  12.  Spironolactone 25 mg oral once a day.  13.  Tamsulosin 0.4 mg oral once a day.  14.  Torsemide 5 mg oral 3 times a day.   PHYSICAL EXAMINATION:  VITAL SIGNS: Temperature 98.3, pulse 95, respirations 18, blood pressure 122/80, saturating 97% on room air.  GENERAL: Obese Caucasian male patient lying in bed, comfortable, conversational, cooperative with exam.  PSYCHIATRIC: Alert, oriented x 3.  Mood and affect appropriate. Judgment intact.  HEENT: Atraumatic, normocephalic. Oral mucosa moist and pink. External ears and nose normal. Pallor positive. No icterus. Pupils bilaterally equal and reactive to light.  NECK: Supple. No thyromegaly. No palpable lymph nodes. Trachea midline. No carotid bruit or JVD.  CARDIOVASCULAR: S1, S2, regular rate and rhythm. Peripheral pulses decreased at 1+, no edema.  RESPIRATORY: Normal work of breathing. Clear to auscultation on both sides.  GASTROINTESTINAL: Soft abdomen, nontender. Bowel sounds present. No visceromegaly palpable.  SKIN: Warm and dry. Left foot ulcer wrapped in a dressing. Chronic stasis changes in bilateral lower extremities. No other ulcers or rash found.  MUSCULOSKELETAL: No joint swelling, redness, effusion of the large joints. Normal muscle tone.  NEUROLOGIC: Motor strength 5 out of 5 in upper and lower extremities. Sensation to fine touch intact all over, except the lower extremities with neuropathy.  LYMPHATIC: No cervical or inguinal lymphadenopathy.   LABORATORY STUDIES:  Glucose 155, BUN 28, creatinine 1.82, sodium 132, potassium 3.4, chloride 95. GFR 35. WBC 14.3, hemoglobin 10.5, platelets 263, neutrophils 88%.   ASSESSMENT AND PLAN:  1.  Left foot ulcer with possible osteomyelitis. Presently a nuclear scan is pending, will await the results. An MRI cannot be obtained secondary to the patient's pacemaker. He is on ciprofloxacin and vancomycin. Will change the to ciprofloxacin to meropenem to cover broadly along with anaerobics. The patient will need either debridement versus amputation if the osteomyelitis is proved. Should be a low risk for the low-risk surgery. If the patient has surgery, cultures will be sent to narrow down the antibiotics.  2.  Acute renal failure/chronic kidney disease stage III. The patient's baseline creatinine seems to be around 1.2 with GFR between 55 to 60. Presently his creatinine is elevated at 1.82.  Will hold off on the spironolactone and torsemide that patient is on. Will start him on intravenous fluids and monitor for any fluid overload and repeat his BUN and creatinine in the morning.  3.  Hypokalemia secondary to medications. Replace.  4.  Anemia of chronic disease. Baseline hemoglobin is around 9. This is stable.  5.  Uncontrolled diabetes mellitus. Will continue home medications. Put him on sliding scale insulin. Titrate up medications if needed after monitoring his blood sugars.  6.  Hypertension, well controlled. Continue medications.  7.  Atrial fibrillation, on Pradaxa. Discussed with Dr. Orland Jarredroxler. Will continue the patient's Pradaxa after surgery. Will hold 1 dose prior to surgery.  8.  Deep vein thrombosis prophylaxis: The patient is on Pradaxa.   CODE STATUS: Full code.   TIME SPENT: Time spent today on this case was 65 minutes, with more than 50% of time spent in coordination of care.    ____________________________ Molinda BailiffSrikar R. Price Lachapelle, MD srs:jm D: 07/15/2012 13:35:30 ET T: 07/15/2012 14:24:01 ET JOB#: [id number]  cc: Wardell HeathSrikar R. Revel Stellmach, MD, <Dictator> Marya AmslerMarshall W. Dareen PianoAnderson, MD Rhona RaiderMatthew G. Troxler, DPM Annice NeedyJason S. Dew, MD Rosalyn GessMichael E. Blocker, MD Wardell HeathSRIKAR R  Endrit Gittins MD ELECTRONICALLY SIGNED 07/17/2012 20:04

## 2014-11-14 NOTE — Consult Note (Signed)
Brief Consult Note: Diagnosis: 1. Left foot osteomyelitis.   Patient was seen by consultant.   Discussed with Attending MD.   Comments: 81 year old male with a history of diabetic foot ulcer with recent osteomyelitis status post long-term IV antibiotic as an outpatient in April and skin graft, staple removed in Aug 2013 with Op abx for right foot. Now left foot ulcer with possible osteomyelitis. peripheral vascular disease of the left foot, type 2 diabetes, atrial fibrillation on Pradaxa, neuropathy secondary to diabetes, and hypertension here for nuclear scan and surgery on podiatry service.  1. Left foot ulcer and possible osteomyelitis - Continue abx. Will change cipro to meropenem with penicillin allergy for broader coverage  2. DM uncontrolled- SSI, ADA  3. ARF over CKD- IVF.Marland Kitchen.  Electronic Signatures: Minette HeadlandSudini, Makhayla Mcmurry Reddy (MD)  (Signed 19-Dec-13 13:24)  Authored: Brief Consult Note   Last Updated: 19-Dec-13 13:24 by Minette HeadlandSudini, Charon Smedberg Reddy (MD)

## 2014-11-14 NOTE — Discharge Summary (Signed)
PATIENT NAME:  Harold EisenmengerBALLARD, Letcher F MR#:  [id number] DATE OF BIRTH:  1933/03/01  DATE OF ADMISSION:  02/26/2012 DATE OF DISCHARGE:  03/06/2012  DISCHARGE DIAGNOSES:  1. Right heel abscess, infection of hardware which was removed. 2. Left diabetic foot ulcer improved with antibiotics.  3. Type 2 diabetes.  4. Renal insufficiency, chronic kidney disease stage III. 5. Anemia from above. 6. Neuropathy, severe. Multiple medications to control pain.  7. Acute renal failure on chronic renal failure.  DISCHARGE MEDICATIONS:  New medications: 1. Levaquin 500 mg daily for two weeks. 2. Clindamycin 300 mg t.i.d. for two weeks.  3. Norco p.r.n. pain.  He will continue his regular medications: 1. Tegretol 200 mg b.i.d.  2. Diltiazem 180 mg daily. 3. Fenofibrate 160 mg daily. 4. Flomax 0.4 mg daily.  5. Mag oxide b.i.d.  6. Metoprolol 25 mg b.i.d.  7. Gabapentin 300 mg b.i.d.  8. Oxybutynin 10 mg daily.  9. Pradaxa 150 mg b.i.d.  10. Simvastatin 40 mg daily. 11. Spironolactone 25 mg daily.  12. Metolazone 5 mg, one-half daily.   13. Glipizide 2.5 mg daily.  14. Torsemide 5 mg 3 times daily.   HISTORY AND PHYSICAL: Please see detailed History and Physical done on admission.   HOSPITAL COURSE: The patient was admitted with left and right foot pain. Ulcer on the left cleared quickly. Right heel did not. His leukocytosis slowly improved with vancomycin and IV ceftriaxone. Podiatry was consulted. Ultimately a bone scan was done which showed an area of the right heel of concern. Surgery was done. Abscess found was found and this was drained. Retained staple from the area was removed. He improved markedly. Infectious disease was consulted and felt that this was not a bone infection so two weeks of oral antibiotic therapy would be sufficient. Culture, however, was negative as he had been on IV antibiotics. Therefore, we will treat with empiric Levaquin and clindamycin as noted which should treat  gram-positives and gram-negatives and anaerobes well orally. We will give him two weeks of that as noted. His white blood cell count did reach as high as 16,600 around the time of admission. His acute renal failure improved to his chronic kidney disease with hydration. His pain is better controlled, not needing much Norco. We will discharge him with that p.r.n. as well. Home Health will see him and pack his wound as her Dr. Graciela HusbandsKlein. I discussed this with Dr. Graciela HusbandsKlein, patient, and nursing staff.  TIME SPENT: It took approximately 38 minutes to do all discharge tasks.   ____________________________ Marya AmslerMarshall W. Dareen PianoAnderson, MD mwa:bjt D: 03/06/2012 12:22:37 ET T: 03/08/2012 11:04:47 ET JOB#: [id number]  cc: Marya AmslerMarshall W. Dareen PianoAnderson, MD, <Dictator> Lauro RegulusMARSHALL W Ammar Moffatt MD ELECTRONICALLY SIGNED 03/09/2012 7:49

## 2014-11-14 NOTE — Consult Note (Signed)
PATIENT NAME:  Harold Cervantes, Harold Cervantes MR#:  [id number] DATE OF BIRTH:  10/20/1932  DATE OF CONSULTATION:  02/27/2012  REFERRING PHYSICIAN:   CONSULTING PHYSICIAN:  Annice Needy, MD  REASON FOR CONSULTATION: Infection and possible source being a chronic foot wound.   HISTORY OF PRESENT ILLNESS: This is a 81 year old male who is known to me for a chronic left foot wound, lymphedema, and other issues. He was admitted with fever. Source is not obviously clear but it is suspected his chronic left foot wound may be the case. This has undergone skin grafting and has a very small opening of less than a centimeter. The appearance of it is actually pretty clean and dry without purulence or erythema around the area. He has chronic lymphedema and leg swelling and has been in Unna boots for months and has issues with congestive heart failure as well. He is not having much pain today. He had been walking up until the time of admission. He is a diabetic.   PAST MEDICAL HISTORY: 1. Hypertension. 2. Diabetes with neuropathy.  3. Atrial fibrillation.  4. Hyperlipidemia.   5. Colonic polyps.  PAST SURGICAL HISTORY: 1. Open reduction internal fixation of bilateral heel fractures.  2. Pacemaker insertion.  3. Bone fragments left great toe.  4. Skin graft to left foot wound.  5. Left foot debridements and prior skin graft placement.   HOME MEDICATIONS:  1. Carbamazepine 200 mg daily.  2. Diltiazem 180 mg daily.  3. Fenofibrate 160 mg daily.  4. Gabapentin 300 mg b.i.d.  5. Glipizide 2.5 mg daily.  6. Mag oxide 1 tablet b.i.d.  7. Metolazone 2.5 mg daily.  8. Metoprolol 25 mg b.i.d.  9. Oxybutynin 1 tablet daily.  10. Pradaxa 150 mg b.i.d.  11. Simvastatin 40 mg daily.  12. Aldactone 25 mg daily.  13. Tamsulosin 0.5 mg daily.  14. Torsemide 5 mg t.i.d.   ALLERGIES: Penicillin, potassium, Actos, and Ambien.   FAMILY HISTORY: Noncontributory to present illness.   SOCIAL HISTORY: He is married. Lives  alone. No alcohol or drug abuse.   REVIEW OF SYSTEMS: GENERAL: Positive for fever and fatigue. No unintentional weight loss or gain. EYES: No blurry or double vision. EARS: No tinnitus or ear pain. CARDIAC: No chest pain or palpitations. RESPIRATORY: No shortness breath or cough. GI: No nausea, vomiting, or diarrhea. GU: No dysuria or hematuria. Positive for renal insufficiency worse than baseline. ENDOCRINE: No heat or cold intolerance. HEME: No anemia or easy bruising. SKIN: Chronic wound of the left foot. MUSCULOSKELETAL: Lower extremity swelling and neuropathy. NEUROLOGIC: Positive for neuropathy. No TIA, stroke, or seizure symptoms. PSYCH: No anxiety or depression.   PHYSICAL EXAMINATION:   GENERAL: He is awake, alert and oriented, not in apparent distress.   VITAL SIGNS: Temperature 99.3, pulse 90, blood pressure 126/67, saturations are 96% on room air.   HEAD: Normocephalic and atraumatic.   EYES: Sclerae nonicteric. Conjunctivae are clear.   EARS: Normal external appearance. Hearing is diminished.   HEART: Irregularly irregular.   LUNGS: Clear and equal bilaterally.   EXTREMITIES: Moderate lower extremity edema which is chronic. His left foot wound is actually reasonably clean and dry today. There is a mild amount of serous drainage. There is not frank purulence or erythema around the wound. It is not particularly tender or fluctuant to exam.   NEUROLOGIC: Decreased sensation in both lower extremities which is significant. Normal strength and tone.   PSYCH: Normal affect and mood.   LABORATORY EVALUATION:  Sodium 138, potassium 3.0, chloride 97, CO2 28, BUN 34, creatinine 1.76, glucose 135, white blood cell count 16.6, hemoglobin 9.8, platelet count 216,000.   ASSESSMENT AND PLAN: This is a 81 year old white male with a chronic left foot wound. He does not have a clear obvious infection but he has been started on antibiotics since his admission. If there is concern for a deeper  underlying infection I think it would certainly be reasonable to consider a bone scan or an MRI. I think it would also be helpful to get his Podiatry team involved. He sees Dr. Ether GriffinsFowler chronically. Their opinion would be helpful in this situation. At current I would not recommend any open debridement or treatment of the wounds. From an arterial standpoint he is known to have some arterial disease but this is not severe and should not require any intervention. I put in for a Podiatry consult as I do think their opinion would be valuable.  This is a Airline pilotLevel-4 consultation.   ____________________________ Annice NeedyJason S. Dew, MD jsd:drc D: 03/17/2012 10:21:19 ET T: 03/17/2012 10:36:20 ET JOB#: [id number]  cc: Annice NeedyJason S. Dew, MD, <Dictator> Annice NeedyJASON S DEW MD ELECTRONICALLY SIGNED 03/17/2012 14:15

## 2014-11-14 NOTE — Op Note (Signed)
PATIENT NAME:  Harold Cervantes, Harold Cervantes MR#:  [id number] DATE OF BIRTH:  [birth year]  DATE OF PROCEDURE:  03/04/2012  PREOPERATIVE DIAGNOSES:  1. Abscess with cellulitis, right heel. 2. Retained hardware, right heel, previous calcaneal fracture.   POSTOPERATIVE DIAGNOSES:  1. Abscess with cellulitis, right heel. 2. Retained hardware, right heel, previous calcaneal fracture.   PROCEDURE: Incision and drainage of abscess right heel with removal of hardware, staple.   SURGEON: Harold Galasodd Ahliyah Nienow, Harold Cervantes  ANESTHESIA: General.   HEMOSTASIS: None.   ESTIMATED BLOOD LOSS: 50 mL.   PATHOLOGY: Cultures right foot abscess.   MATERIALS: None.   DRAINS: 4 x 4 saline soaked gauze packed within the wound.   COMPLICATIONS: None apparent.   OPERATIVE INDICATIONS: This is a 81 year old male recently admitted for possible sepsis. He has had some continued cellulitis in his right heel with exquisite pain and continued elevation of his white count and neutrophil percent. Decision was made for incision and drainage following bone scan which revealed increased uptake in the right heel.   OPERATIVE PROCEDURE: The patient was taken to the Operating Room and placed on the table in the supine position. Following satisfactory general endotracheal anesthesia, the right foot was prepped and draped in the usual sterile fashion. A tourniquet was applied to the right ankle prior to prep.   Attention was then directed to the medial aspect of the right heel where an approximate 4 cm linear incision was made coursing proximal posterior to plantar distal along the medial aspect of the calcaneus. The incision was deepened via sharp and blunt dissection down to the level of the heel bone where a metallic screw was identified. It was sitting proud off of the bone by about 5 or 6 mm. The incision was carried distally where a large abscess with moderate purulent discharge was expressed along the plantar medial portion of the heel. A culture  was taken for sensitivities. Using hemostats the abscess was opened and explored and purulence was expressed both medially and slightly along the plantar aspect. Tunneling did not extend more than a couple of centimeters in each direction. Attention was then directed back towards the medial aspect where the staple was located and this was attempted to be removed with an impactor. The staple could not be grasped so it was cut mid staple and then the two legs were then removed using pliers. The wound was then flushed with copious amounts of sterile saline, approximately 3 liters. The incision was then closed along the proximal aspect using 3-0 nylon vertical mattress and 4-0 nylon vertical mattress in simple interrupted sutures. The distal aspect was left open for packing. The distal wound was then packed with sterile saline soaked gauze and 4 x 4's, fluffs, ABD, Kerlix, and an Ace wrap were applied. The patient tolerated the procedure and anesthesia well and was transported to the postanesthesia care unit with vital signs stable and in good condition.  ____________________________ Harold Cervantes, Harold Cervantes tc:slb D: 03/04/2012 14:07:45 ET T: 03/04/2012 16:51:10 ET JOB#: [id number]  cc: Harold Galasodd Tabia Landowski, Harold Cervantes, <Dictator> Cuauhtemoc Huegel Harold Cervantes ELECTRONICALLY SIGNED 03/23/2012 12:36

## 2014-11-14 NOTE — Consult Note (Signed)
PATIENT NAME:  Harold Cervantes, Harold Cervantes MR#:  [id number] DATE OF BIRTH:  02/04/1933  DATE OF CONSULTATION:  03/01/2012  REFERRING PHYSICIAN:   CONSULTING PHYSICIAN:  Linus Galas, DPM  HISTORY OF PRESENT ILLNESS: This is a 81 year old male who was recently admitted with fever and altered mental status. Recently he has had some chronic problems with ulceration on his left foot status post gas gangrene. He has been on some IV antibiotics. He is also known to have peripheral vascular disease currently being managed by [facility] with Dr. Wyn Quaker. His chief complaint today is of redness, pain, and swelling in his right heel and ankle area. He denies any history of injury to that foot. He states the left is not really giving him that much trouble.   PAST MEDICAL HISTORY:  1. Hypertension.  2. Type II diabetes with neuropathy.  3. Atrial fibrillation, on Pradaxa.  4. Positive PPD, untreated.  5. Hypertriglyceridemia.  6. Impotence.  7. Colon polyps.   PAST SURGICAL HISTORY:  1. ORIF bilateral heel fractures.  2. Pacemaker insertion.  3. Removal of bone fragments, great toe.  4. Incision and drainage gas gangrene, left foot.  5. Skin graft, left foot.   HOME MEDICATIONS:  1. Carbamazepine 200 mg daily.  2. Diltiazem 180 mg daily.  3. Fenofibrate 160 mg daily.  4. Gabapentin 300 mg b.i.d.  5. Glipizide 2.5 mg daily.  6. Mag oxide 1 tablet 2 times daily.  7. Metolazone 5 mg half tablet daily.  8. Metoprolol 25 mg 2 times a day.  9. Oxybutynin 1 tab daily. 10. Pradaxa 150 two times a day.  11. Simvastatin 40 mg daily.  12. Aldactone 25 mg daily. 13. Tamsulosin 0.5 mg daily. 14. Torsemide 5 mg 3 times a day.   ALLERGIES: Penicillin, potassium, Actos, Ambien.   SOCIAL HISTORY: The patient is married. Lives at home. No recent alcohol or tobacco use.   FAMILY HISTORY: Unremarkable.   REVIEW OF SYSTEMS: Denies any current fever or chills. Some redness and swelling with pain in the  right foot. He does have some numbness and paresthesias in the extremities. Chronic wound on the plantar aspect of his left foot. Chronic swelling in both lower extremities. Denies any cough, wheezing, or shortness of breath. No chest pains. No stomach pain, heartburn, vomiting, nausea, or diarrhea. Denies any hearing or vision changes.   PHYSICAL EXAMINATION:   VASCULAR: DP and PT pulses are trace but palpable. Capillary filling time is intact.   NEUROLOGICAL: Loss of protective threshold with monofilament wire distally. Proprioception is impaired.   INTEGUMENTARY: Skin is warm, dry, and atrophic. Left foot is covered with an Radio broadcast assistant. There is a chronic full thickness ulceration beneath the left first metatarsal. No significant cellulitis, drainage, or underlying abscess is present. There is significant erythema and edema with increased skin temperature on the right heel both medial and lateral. No evidence of abscess or open lesions.   MUSCULOSKELETAL: Guarded range of motion.   X-RAYS: Plain films of the left reveal significant arterial calcification in the foot. There does appear to be some possible erosion of the tibial sesamoid plantarly and distally. Could signify some chronic osteo or previous surgical changes. Three views of the right foot reveal again extensive calcification of the arteries. Retained hardware is noted in the heel. There is a radiolucent circular area on the lateral view possibly representing an old screw hole versus destructive lesion. No clear evidence of cortical erosion is noted.   IMPRESSION:  1. Cellulitis with pain, right foot.  2. Chronic ulceration, left first metatarsal.  3. Diabetes with neuropathy.   PLAN:  1. We will obtain a three-phase bone scan with CT evaluation to determine if there is an active bone process in the right heel. Also, will give us some evaluation of the left first metatarsal area.  2. He will continue on his antibiotics.  3. Wait for  the results of the bone scan and narrow our treatment.  ____________________________ Linus Galasodd Reem Fleury, DPM tc:drc D: 03/01/2012 13:28:51 ET T: 03/01/2012 13:39:41 ET JOB#: [id number]  cc: Linus Galasodd Jaxxen Voong, DPM, <Dictator> Marya AmslerMarshall W. Dareen PianoAnderson, MD Keliah Harned DPM ELECTRONICALLY SIGNED 03/04/2012 14:51

## 2014-11-14 NOTE — Consult Note (Signed)
PATIENT NAME:  Harold Cervantes, Harold Cervantes MR#:  [id number] DATE OF BIRTH:  1932-09-10  DATE OF CONSULTATION:  03/05/2012  REFERRING PHYSICIAN:  Dr. Dareen PianoAnderson  CONSULTING PHYSICIAN:  Rosalyn GessMichael E. Briar Witherspoon, MD  REASON FOR CONSULTATION: Right heel infection.  HISTORY OF PRESENT ILLNESS: The patient is a 81 year old white man with a past history significant for diabetic neuropathy who was admitted on 08/01 with fever and altered mental status. The patient has been treated with Unna boots and diuretics for bilateral lower extremity edema. He presented to the Emergency Room because he began having confusion. He was found to be febrile in the Emergency Room. The patient does not recall the events leading up to his hospitalization but states that he has had some pain in his right foot with weightbearing. He has not had any recent trauma although he did have surgery on both feet after falling from a ladder in 1986 and had surgical repair. He does not recall having fevers, chills, or sweats at home. He was admitted on the 1st and at that time he had urine and blood cultures which were unremarkable. His white count was initially 11.5 but went up to 16.6. He was given Levaquin initially and is currently on Levaquin and vancomycin. A bone scan demonstrated uptake in the right heel and Podiatry performed an I and D yesterday. Intraoperatively they found an old staple that had come out from the bone somewhat. They removed this. There was no evidence for infection around the staple. They found an area of abscess further down in the heel and this was debrided and irrigated. I spoke with Podiatry who did not find any evidence for osteomyelitis. The patient is feeling much better since surgery. His pain is relieved. He has not had any elevated temperatures since admission.   ALLERGIES: Actos, Ambien, penicillin, and Band-Aids.  PAST MEDICAL HISTORY:  1. Diabetic neuropathy.  2. Hypertension.  3. Atrial fibrillation.   4. Hypertriglyceridemia.  5. Colonic polyps.  6. Status post pacemaker placement.  7. Bilateral heel fractures status post ORIF in 1986.  8. 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   SOCIAL HISTORY: The patient lives at home with his wife. He does not smoke. He does not drink. No injecting drug use history.   FAMILY HISTORY: Noncontributory.  REVIEW OF SYSTEMS: The patient was admitted with fevers but he does not recall having any fevers, chills, or sweats at home. He denies any malaise or fatigue. RESPIRATORY: No cough, shortness of breath, or sputum production. CARDIAC: No chest pains or palpitations. GI: No nausea, no vomiting, no abdominal pain, no change in his bowels. He has some chronic constipation. GU: No change in his urine. MUSCULOSKELETAL: He had some pain in his right heel which is now much better since surgery. He has had chronic lower extremity edema bilaterally. SKIN: No rashes. NEUROLOGIC: No focal weakness.   PHYSICAL EXAMINATION:   VITAL SIGNS: T-max 99.6, T-current 97.6, pulse 102, blood pressure 138/78, 93% on room air.   GENERAL: 81 year old white man in no acute distress.   HEENT: Normocephalic, atraumatic.   CHEST: Clear to auscultation bilaterally with good air movement. No focal consolidation.   CARDIAC: Regular rate and rhythm without murmur, rub, or gallop.   ABDOMEN: Soft, nontender, and nondistended. No hepatosplenomegaly. No hernia is noted.   EXTREMITIES: Left lower extremity is wrapped in Foot LockerUnna boot. The right lower extremity was wrapped in bandages. The surgical wound was not directly observed. There was no evidence for lymphangitic  streaking. There were no other rashes, specifically no stigmata of endocarditis such as Janeway lesions or Osler nodes.   NEUROLOGIC: The patient is awake and interactive, moving all four extremities.   PSYCHIATRIC: Mood and affect appeared normal.   LABORATORY DATA: BUN 15, creatinine 1.19, white count 9.0 with  hemoglobin 8.9, platelet count 357, ANC 7.8. His white count had been elevated since admission with a peak of 16.6. Wound culture from an intraoperative wound culture is pending. Blood cultures and urine cultures from admission show no growth. A urinalysis from admission is unremarkable.   A CT scan of the head without contrast shows no acute abnormalities.   Chest x-ray from admission showed chronic stable enlargement of the heart but no pulmonary edema. There was mild hyperinflation.  Left foot x-rays showed no obvious bony abnormalities of the right or the left foot.  Bone scan was positive for abnormal localization within the calcaneus of the right heel.   IMPRESSION: This is a 81 year old white man with a past history significant for diabetic neuropathy admitted with right heel abscess.   RECOMMENDATIONS:  1. He was admitted with confusion and fever. Bone scan showed activity in the right heel. Surgical exploration showed a staple which was removed and an abscess which was drained. I spoke with Podiatry who did not find any evidence for bone infection intraoperatively.  2. His current antibiotics do not cover anaerobes. Will change him to tigecycline until the cultures are available. This will not cover Pseudomonas, however.  3. Will wait until the cultures return to narrow his antibiotics.  4. As there is no evidence for osteomyelitis, I would recommend treatment for two weeks of oral therapy (from the day of surgery) based on the culture results.  5. I will be out-of-town until next Thursday but will be available by phone for any questions.   Thank you very much for involving me in Mr. Anzalone care.   ____________________________ Rosalyn Gess. Makynleigh Breslin, MD meb:drc D: 03/05/2012 15:26:41 ET T: 03/05/2012 15:54:24 ET JOB#: [id number]  cc: Rosalyn Gess. Pilot Prindle, MD, <Dictator>  Croy Drumwright E Marian Meneely MD ELECTRONICALLY SIGNED 03/19/2012 15:11

## 2014-11-14 NOTE — H&P (Signed)
PATIENT NAME:  Harold Cervantes, TOMPSON MR#:  [id number] DATE OF BIRTH:  10-Nov-1932  DATE OF ADMISSION:  02/26/2012  PRIMARY CARE PHYSICIAN: Dr. Einar Crow  REFERRING PHYSICIAN: Dr. Margarita Grizzle     CHIEF COMPLAINT: Fever and altered mental status.   HISTORY OF PRESENT ILLNESS: This is a 81 year old male with a history of diabetic foot ulcer with recent osteomyelitis status post long-term IV antibiotic as an outpatient in April and skin graft, peripheral vascular disease of the left foot, type 2 diabetes, atrial fibrillation on Pradaxa, neuropathy secondary to diabetes, and hypertension who presents for episodes of confusion and fever; the wife reports the patient had a fever of 103 and was confused this evening, which prompted her to bring him to the Emergency Department. The patient was hypotensive, in atrial fibrillation with RVR, and febrile with leukocytosis. The patient's urinalysis and chest x-ray were negative. The patient was given IV vancomycin and Levaquin empirically, and was hypotensive where he did receive 1 liter of fluid. Thereafter his altered mental status resolved and he came back to his baseline. The patient has a left foot diabetic foot ulcer followed by Dr. Wyn Quaker as an outpatient. The patient's wife reports he has been having purulent discharge for the last few days. The patient denies any chest pain, shortness of breath, dysuria, abdominal pain, nausea, vomiting, or diarrhea. The patient was found to have elevated creatinine from his baseline.   PAST MEDICAL HISTORY:  1. Hypertension.  2. Type 2 diabetes with neuropathy.  3. Atrial fibrillation on Pradaxa.  4. Positive PPD untreated.  5. Hypertriglyceridemia.  6. Impotence.  7. Colon polyps.   PAST SURGICAL HISTORY:  1. Open reduction and internal fixation of bilateral heel fractures.  2. Pacemaker insertion.  3. Removal of bone fragment of left great toe.  4. Recent skin graft of left foot wound.   HOME MEDICATIONS:   1. Carbamazepine 200 mg daily.  2. Diltiazem 180 mg daily.  3. Fenofibrate 160 mg daily.  4. Gabapentin 300 mg 2 times a day.  5. Glipizide 2.5 mg daily.  6. Mag Oxide 1 tablet 2 times a day.  7. Metolazone 5 mg, 1/2 tablet daily.  8. Metoprolol 25 mg 2 times a day.  9. Oxybutynin 1 tablet daily.  10. Pradaxa 150, 2 times a day.  11. Simvastatin 40 daily.  12. Aldactone 25 daily.  13. Tamsulosin 0.5  daily.  14. Torsemide 5 mg 3 times a day.   ALLERGIES: Penicillin, potassium, Actos, Ambien.   FAMILY HISTORY: Family history was reviewed and remarkable for this patient.   SOCIAL HISTORY: The patient is married. Lives at home. No recent history of alcohol, tobacco, or drug abuse.   REVIEW OF SYSTEMS:  CONSTITUTIONAL: Complains of fever, fatigue, generalized weakness. EYES: Denies blurry vision, double vision, or pain.  ENT: Denies tinnitus, ear pain, or hearing loss. RESPIRATORY: Denies any cough, wheezing, hemoptysis, or dyspnea. CARDIOVASCULAR: Denies chest pain, orthopnea, or edema. GI: Denies nausea, vomiting, diarrhea, or abdominal pain. GU: Denies any dysuria, hematuria, or renal colic. ENDO: Denies polyuria, polydipsia, thyroid problems, or heat or cold intolerance. HEMATOLOGY: Denies anemia, easy bruising, or bleeding diathesis. INTEGUMENT: Has chronic left lower extremity wounds with diabetic foot ulcer. MUSCULOSKELETAL: Denies any neck pain, shoulder pain, or back pain. NEUROLOGIC: Denies, dysarthria, epilepsy, or tremors. Has diabetic neuropathy. PSYCH: Denies any insomnia, schizophrenia, or nervousness.   PHYSICAL EXAMINATION:  VITAL SIGNS: Pulse 100, respiratory rate 18, blood pressure 96/47, saturating 98% on room air.  GENERAL: Elderly male looks comfortable in bed in no apparent distress.   HEENT: Head is atraumatic, normocephalic. Pupils equal, reactive to light. Pink conjunctivae. Anicteric sclerae. Moist oral mucosa.   NECK: Supple. No thyromegaly. No JVD.   CHEST:  Good air entry bilaterally. No wheezing, rales, or rhonchi.   CARDIOVASCULAR: S1, S2 heard. No rubs, murmurs, or gallops. Irregularly irregular. Mildly tachycardic.    ABDOMEN: Soft, nontender, nondistended. Bowel sounds present.   EXTREMITIES: Right lower extremity- no edema, no erythema. The left lower extremity has Unna boot, wrapped to the mid foot level. At the inferior surface of the left foot there is a diabetic foot ulcer with purulent discharge.   PSYCHIATRIC: Appropriate affect. Awake, alert, oriented times three. Intact judgment and insight.   SKIN:  As mentioned in the lower extremity exam with diabetic foot ulcer.   PSYCHIATRIC: Appropriate affect. Awake, alert, oriented times three. Intact judgment and insight.   NEUROLOGIC: Cranial nerves grossly intact. Motor five out of five in all extremities.   PERTINENT LABS:  BUN 38, creatinine 1.93, sodium 136, potassium 3.5, chloride 94, CO2 33. Urinalysis is negative. White blood cells 11.5, hemoglobin 11.1, hematocrit 32.1, platelets 256.   ASSESSMENT AND PLAN:  1. Altered mental status, confusion, encephalopathy. This is metabolic encephalopathy secondary to sepsis, currently resolved.  2. Sepsis. This is most likely due to infected left diabetic foot ulcer as chest x-ray is negative and urinalysis is negative. Meningitis is unlikely given the fact that the patient has no meningeal sign and the source of infection at this point appears to be more likely related to his diabetic ulcer. Blood cultures were sent. The patient has recent history of osteomyelitis, started empirically on IV vancomycin and Levaquin. Consult vascular surgery as the patient has been followed by Dr. Wyn Quakerew as an outpatient for this ulcer. 3. Acute on chronic renal failure. The patient is on multiple diuretics which were held. We will continue with IV fluid.  4. Atrial fibrillation with RVR. Unfortunately the patient cannot be resumed back on beta blocker and Cardizem  secondary to low blood pressure. Meanwhile we will continue with IV fluid resuscitation as his RVR is most likely related to his volume depletion and sepsis. When blood pressure improves we will resume him back on beta blocker, Cardizem. Meanwhile as well we will hold his anticoagulation Pradaxa as the patient might need surgical intervention and debridement of his wound if deemed necessary by vascular surgery.  5. Diabetes mellitus. We will hold all oral hyperglycemic agents and will start him on insulin sliding scale.  6. Hyperlipidemia. We will continue the patient on simvastatin and fenofibrate.   7. CODE STATUS: The patient is FULL CODE. 8. Deep vein thrombosis prophylaxis. Meanwhile we will start the patient on subcutaneous heparin until his Pradaxa is resumed.   TOTAL TIME SPENT ON PATIENT CARE: 60 minutes.   ____________________________ Starleen Armsawood S. Brayon Bielefeld, MD dse:bjt D: 02/27/2012 00:23:33 ET T: 02/27/2012 07:42:11 ET JOB#: [id number]  cc: Starleen Armsawood S. Alisyn Lequire, MD, <Dictator> Marya AmslerMarshall W. Dareen PianoAnderson, MD Timea Breed Teena IraniS Belvin Gauss MD ELECTRONICALLY SIGNED 02/27/2012 22:35

## 2014-11-14 NOTE — Consult Note (Signed)
Impression: 81yo WM w/ h/o diabetic neuropathy admitted with right left heel abscess.  He was admitted with confusion and fever.  Bone scan showed activity in the right left heel.  Surgical exploration showed a staple which was removed and an abscess which was drained.  I spoke with podiatry who did not find any evidence for bone infection intraoperatively. His current antibiotics do not cover anaerobes.  Will change him to tigecycline until the cultures are available.  This will not cover Pseudomonas, however. Will wait until the cultures return to narrow his antibiotics. As there is no evidence for osteomyelitis, I would recommend treatment for two weeks of oral therapy (from the day of surgery) based on the culture results. I will be out of town until next Thursday, but will be available by phone for any questions.   Electronic Signatures: Marisa Hufstetler, Rosalyn GessMichael E (MD) (Signed on 09-Aug-13 15:16)  Authored   Last Updated: 09-Aug-13 15:18 by Kartik Fernando, Rosalyn GessMichael E (MD)

## 2014-11-17 NOTE — Op Note (Signed)
PATIENT NAME:  Harold Cervantes, Harold Cervantes MR#:  [id number] DATE OF BIRTH:  09/15/1932  DATE OF PROCEDURE:  07/16/2012  PREOPERATIVE DIAGNOSIS: Osteomyelitis, first metatarsophalangeal joint left foot.   POSTOPERATIVE DIAGNOSIS: Osteomyelitis, first metatarsophalangeal joint left foot.   PROCEDURE: First ray resection amputation, left foot.   SURGEON: Rhona Raider. Algis Lehenbauer, D.P.M.  ASSISTANT: None.   HISTORY OF PRESENT ILLNESS: This is a diabetic, has had a chronic ulceration of submet 1 for a number of months, but nearly a year or more he has had this off and on. It has been chronically open, and he will get it healed and it opens up again. The patient presented to the office yesterday with extensive redness, swelling and cellulitis. Bone scans were indicative of osteomyelitis likely in the region.   ANESTHESIA: IVA with local anesthesia.   ESTIMATED BLOOD LOSS: Approximately 25 to 30 mL.   HEMOSTASIS: None.   OPERATIVE REPORT: The patient was brought to the OR and placed on the OR table in the supine position. At this point after sedation and local anesthesia was achieved, the patient was then prepped and draped in the usual sterile manner.   At this time, attention was directed to the plantar ulcer. It was on the plantar medial area, just at the tibial sesamoid. Tibial sesamoid was evident in the wound. An incision was made distal and proximal to this, approximately 3 cm. The sesamoid was identified and removed. This was cultured and sent to pathology, as well as culture and sensitivity of the bone. At this point, a hemostat was used to track the progression of infection noted. Purulence was draining from the area of the MTPJ and also extended up to the IPJ of the hallux. There was actually a higher concentration of infection at the IPJ than there was at the MTPJ. The bone was very soft around the IPJ area. The metatarsal head was also probed. The tibial sesamoid had all been removed. The head of the  metatarsal was probed and there seemed to be cystic areas in this as well. At this time, I elected to do a first ray amputation rather than just try to resect bone because of the extensive involvement of the IPJ proximal phalanx and the metatarsophalangeal joint metatarsal head. At an area of clean bone, I used power equipment to osteotomize the metatarsal head and distal shaft in a dorsal distal plantar proximal fashion. The head was removed. This was after amputation of the digit.    Prior to the metatarsal head resection, the left great toe was amputated in an elliptical incision running transversely from medial proximal to distal lateral. The incision was carried down to bone. The joint structures, soft tissue structures, tendons and capsular tissues were released dorsally and plantarly, medial and laterally. The toe was removed and sent to pathology for evaluation. The area was copiously irrigated, cleaned with a VersaJet to remove all infected tissues. That [place] where the metatarsal head was resected as described. At this point, beads had already been prepared with vancomycin and tobramycin. The wound was irrigated and partially closed with 3-0 Vicryl simple interrupted sutures. The beads were placed in the wound at this point and remaining wound was closed with 3-0 nylon simple interrupted sutures. There was essentially a medial flap that was encountered via the incision margin. This helped to close it actually and everything closed nicely. The flap looked like it had good perfusion to the area. Everything was pink at the time of closure and blood loss  was noted. A sterile compressive dressing was placed across the wound at this time consisting of Xeroform gauze, 4 x 4's, Kling, Kerlix and ABD pad. The patient appeared to tolerate the procedure and anesthesia well and left the OR for the recovery room with vital signs stable and neurovascular status intact.   ____________________________ Rhona RaiderMatthew G.  Nikaya Nasby, DPM mgt:jm D: 07/16/2012 13:58:38 ET T: 07/17/2012 16:38:41 ET JOB#: [id number]  cc: Rhona RaiderMatthew G. Yameli Delamater, DPM, <Dictator> Epimenio SarinMATTHEW G Jemarion Roycroft MD ELECTRONICALLY SIGNED 09/21/2012 14:29

## 2014-11-17 NOTE — H&P (Signed)
PATIENT NAME:  Harold Cervantes, Harold Cervantes MR#:  [id number] DATE OF BIRTH:  1933/04/10  DATE OF ADMISSION:  07/15/2012  HISTORY OF PRESENT ILLNESS: Harold Cervantes is a 81 year old Caucasian male who has had some chronic history of diabetic ulcerations on both feet, most recently a right heel ulcer and a submet 1 ulcer on his left foot. He has been followed by Harold Cervantes and Harold Cervantes. He has also been followed by Harold Cervantes at [facility]. Harold Cervantes saw him the end of November, and he was very stable on his right heel. The left heel still had an ulcerative change to it.  It was debrided and dressed, and he was recommended to go into an OrthoWedge shoe at that point. He came back in in a month and actually was seen at Harold Cervantes's office yesterday, and had significant swelling and redness and infection to that left foot at the plantar ulcer of the submetatarsal-first metatarsophalangeal joint.   PAST MEDICAL HISTORY: Hypertension, type 2 diabetes, atrial fibrillation with a pacemaker, history of positive PPDs and treated, hypertriglyceridemia, significant peripheral neuropathy, impotence, colon polyps, sleep apnea, prostate cancer, PAD with the arteriogram performed by Harold Cervantes.  He is status post arteriogram. Bladder problems are treated by Harold Cervantes.   PAST SURGICAL HISTORY: Foot surgery, cryosurgery and cataract surgery.   CURRENT MEDICATIONS: Carbatrol ER capsule 200 mg twice a day, diltiazem tablet 180 mg once a day, fenofibrate 160 mg once a day, glipizide 5 mg 1/2 tablet once a day, multivitamins, Flonase spray 15 mcg per actuation 2 sprays in both nostrils as directed, Oxybutynin chloride tablet 10 mg once daily, Flomax capsule 0.4 mg once a day, torsemide 5 mg once a day, simvastatin 40 mg once a day, B12 500 mcg, he takes that daily, and a Pradaxa capsule 150 mg twice a day, metolazone 5 mg, 1/2 tablet as needed.   ALLERGIES: PENICILLIN, HE HAD A RASH IN 2003 AFTER TAKING AN ORAL PENICILLIN-BASED  ANTIBIOTIC. HE IS ALLERGIC TO ACTOS, IT CAUSED SWELLING. AMBIEN CAUSES CONFUSION:    FAMILY HISTORY: Father deceased at age 79 with laryngeal cancer. Mother died at age 79, hypertension-related problems.   SOCIAL HISTORY: He quit smoking in 1990.  EtOH: He has 4 beers a day.   PHYSICAL EXAMINATION:  GENERAL: He is alert, well oriented, a little bit lethargic and tired because of the infection he has in his foot. He weighs 215.  VITAL SIGNS: Blood pressure is 140/80, pulse 65, respirations 16.  LOWER EXTREMITY EXAM: Vascular:  DP pulses are a little difficult to palpate. I can feel a PT pulse. He has some venostasis disease bilaterally. Capillary refill times are good to both feet. He is status post angioplasty by Harold Cervantes.  DERMATOLOGIC: His dermatologic exam shows an open wound, probes down to bone on the  first metatarsophalangeal joint, left foot. Harold Cervantes evaluated this today. He has cellulitis, redness with drainage from the area. Harold Cervantes felt like he could see a sesamoid in the wound which bodes poorly for the likelihood of osteomyelitis.   CLINICAL IMPRESSION: Likely osteomyelitis of left first MTPJ.   TREATMENT PLAN: We are going to go ahead and admit him to the hospital today, get him started with vancomycin and Cipro intravenously. I got a culture in the office today, but we sent that to the hospital for a workup; and I have got him scheduled for surgery tomorrow, but I want to get a bone scan on him today. We  cannot do an MRI because of his pacemaker, so I will likely schedule him for surgery tomorrow around noon and hopefully get some information back on the bone scan by that time. Also, I consulted Harold Cervantes for his diabetes and medical followup while he is in the hospital. I spoke with Harold Cervantes personally about this, and we will switch him over to Harold Cervantes service starting tomorrow.  ____________________________ Rhona Raider Taffy Delconte, DPM mgt:cb D: 07/15/2012 12:44:50  ET T: 07/15/2012 13:33:13 ET JOB#: [id number]  cc: Rhona Raider Carie Kapuscinski, DPM, <Dictator> Epimenio Sarin MD ELECTRONICALLY SIGNED 09/21/2012 14:29

## 2014-11-19 NOTE — Op Note (Signed)
PATIENT NAME:  Harold Cervantes, Harold Cervantes MR#:  [id number] DATE OF BIRTH:  1932-08-20  DATE OF PROCEDURE:  08/25/2011  PREOPERATIVE DIAGNOSES:  1. Osteomyelitis/cellulitis of the foot with need for long-term IV antibiotics.  2. Diabetes.  3. Peripheral arterial disease.   POSTOPERATIVE DIAGNOSES: 1. Osteomyelitis/cellulitis of the foot with need for long-term IV antibiotics.  2. Diabetes.  3. Peripheral arterial disease.   PROCEDURES:       1. Ultrasound guidance for vascular access, right basilic vein.  2. Fluoroscopic guidance for placement of catheter.  3. Placement of a peripherally inserted central venous catheter, right arm.   SURGEON: Annice NeedyJason S. Docie Abramovich, M.D.  ASSISTANT: Gari Crownhristopher Gibbons, vascular technologist.   ESTIMATED BLOOD LOSS: Minimal.  FLUOROSCOPY TIME: One minute.  CONTRAST USED: None.   INDICATION FOR PROCEDURE: This is a 81 year old white male who has cellulitis/osteomyelitis of his foot. He has had debridement. He will need at least two weeks of IV antibiotics and so a PICC line is requested.   DESCRIPTION OF PROCEDURE: The patient was brought to the vascular interventional radiology suite. The right upper extremity was sterilely prepped and draped and a sterile surgical field was created. The right basilic vein visualized under ultrasound was patent. It was then accessed under direct ultrasound guidance without difficulty with a micropuncture needle. An 0.018 wire was placed. A sheath was placed over the 0.018 wire and then a single lumen peripherally inserted central venous catheter was placed through the sheath over the wire and the wire and sheath were removed. Catheter tip was placed in the superior vena cava and it withdrew blood well and flushed easily with heparinized saline. It was secured to the skin at 36 cm with a sterile dressing. The patient tolerated the procedure well and was taken to the recovery room in stable condition.   ____________________________ Annice NeedyJason  S. Bryanda Mikel, MD jsd:ap D: 08/25/2011 16:12:51 ET T: 08/25/2011 17:23:48 ET JOB#: [id number]  cc: Annice NeedyJason S. Jocelin Schuelke, MD, <Dictator> Annice NeedyJASON S Amiliah Campisi MD ELECTRONICALLY SIGNED 09/15/2011 9:20

## 2014-11-19 NOTE — Op Note (Signed)
PATIENT NAME:  Harold Cervantes, Harold Cervantes MR#:  [id number] DATE OF BIRTH:  03-02-1933  DATE OF PROCEDURE:  08/24/2011  PREOPERATIVE DIAGNOSIS: Left foot abscess.   POSTOPERATIVE DIAGNOSIS: Left foot abscess.   PROCEDURE: Incision and drainage of multiple areas left foot abscesses with deep web space abscess of the left foot.   SURGEON: Tieler Cournoyer A. Ether GriffinsFowler, DPM   ANESTHESIA: MAC with local  COMPLICATIONS: None.   SPECIMEN: None.   CULTURES: Deep wound culture for aerobic and anaerobic evaluation.   ESTIMATED BLOOD LOSS: 25 mL.  OPERATIVE INDICATIONS: This is a 81 year old gentleman who I evaluated yesterday with an infection in his left foot with cellulitis. There was some noted gas in the soft tissues on the CT scan, and he was brought up to the Operating Room today for incision and drainage.   OPERATIVE PROCEDURE: The patient was brought into the Operating Room and placed on the operating table in the supine position. IV sedation was administered by the Anesthesia team. The left lower extremity was anesthetized with a local block around the first ray with lidocaine and Marcaine. The wound site was also infiltrated with 3 mL of 1% lidocaine with epinephrine. After anesthesia was obtained, an incision was made initially along the lateral first web space of the left great toe region. There was noted to be a large amount of necrotic infected tissue. There was an area that had abscess deep under the first metatarsophalangeal joint. There was another tunneling area to the medial first metatarsophalangeal joint. The incision was brought up medial and then brought proximal along the medial first ray. Infection was noted extending along the course of the flexor hallucis longus tendon to approximately the mid foot level. All areas were then debrided with a VersaJet down to healthy tissue. There were noted islands of necrotic tissue that I had to remove from the skin down to the subcutaneous tissue. After all  infectious material was removed and debrided away with a VersaJet, the wound was packed and closed very loosely with a 4-0 nylon suture. He was placed in a well-padded sterile dressing to his left foot. He will be taken back to the floor for continued monitoring. The patient is at high risk of losing likely his first toe and partial first ray as there was an extensive amount of necrotic tissue with a dusky appearance of his toe. We will continue to monitor this week and possibly bring him back to the OR for further I and D and surgery as needed. We will continue with IV antibiotics until the cultures have returned.   ____________________________ Argentina DonovanJustin A. Ether GriffinsFowler, DPM jaf:cbb D: 08/24/2011 09:08:11 ET T: 08/24/2011 16:42:11 ET JOB#: [id number]  cc: Jill AlexandersJustin A. Ether GriffinsFowler, DPM, <Dictator> Marquise Wicke DPM ELECTRONICALLY SIGNED 08/28/2011 15:20

## 2014-11-19 NOTE — Op Note (Signed)
PATIENT NAME:  Harold Cervantes, Harold F MR#:  [id number] DATE OF BIRTH:  05/04/1933  DATE OF PROCEDURE:  11/20/2011  PREOPERATIVE DIAGNOSES:  1. Nonhealing wound, left foot.  2. Peripheral arterial disease.  3. Atrial fibrillation.  4. Hypertension.  5. Diabetes mellitus.   POSTOPERATIVE DIAGNOSES: 1. Nonhealing wound, left foot.  2. Peripheral arterial disease.  3. Atrial fibrillation.  4. Hypertension.  5. Diabetes mellitus.   PROCEDURE: Split-thickness skin graft to the left foot using the left thigh as a donor site for approximately 20 square centimeters.   SURGEON: Annice NeedyJason S. Imre Vecchione, MD   ANESTHESIA: General.   ESTIMATED BLOOD LOSS: Minimal.   INDICATION FOR PROCEDURE: The patient is a 81 year old white male who has been treated for a longstanding left foot wound. This has granulated well and is being cared for wonderfully by Dr. Ether GriffinsFowler. He now has a good granulating base and is ready for skin graft placement. Risks and benefits were discussed. Informed consent was obtained.   DESCRIPTION OF PROCEDURE: The patient is brought to the operative suite and after an adequate level of general anesthesia was obtained, his left lower extremity was sterilely prepped and draped and a sterile surgical field was created. The Zimmer dermatome was used to shave a piece of skin approximately 18/1000 of an inch from the left thigh anteriorly using a two-inch dermatome. This was then meshed 1.5:1 and stapled down overlying the wound with a series of staples. The total size of the wound was approximately 20 to 25 sq cm. A piece of Adaptic was then placed over the skin graft and stapled down and a VAC sponge was used to create a nice occlusive seal overlying the wound. Xeroform, Telfa, and Tegaderm were placed overlying the donor site. The patient was then awakened from anesthesia and taken to the recovery room in stable condition.   ____________________________ Annice NeedyJason S. Lugene Beougher, MD jsd:drc D: 11/20/2011 14:52:28  ET T: 11/20/2011 15:24:59 ET JOB#: [id number]  cc: Annice NeedyJason S. Sutton Plake, MD, <Dictator> Annice NeedyJASON S Kaleeya Hancock MD ELECTRONICALLY SIGNED 11/24/2011 16:37

## 2014-11-19 NOTE — Consult Note (Signed)
PATIENT NAME:  Harold EisenmengerBALLARD, Thijs F MR#:  [id number] DATE OF BIRTH:  20-Nov-1932  DATE OF CONSULTATION:  08/22/2011  REFERRING PHYSICIAN:   CONSULTING PHYSICIAN:  Marya AmslerMarshall W. Dareen PianoAnderson, MD  CHIEF COMPLAINT: Leg pain.  HISTORY OF PRESENT ILLNESS:  Mr. Harold CalamityBallard is a pleasant 81 year old male with long-standing diabetes as well as severe neuropathy. He has had left diabetic foot infection. He has had podiatric surgery, vascular disease. He has a lot of pain in his legs. He presented to podiatry today with more redness and swelling in that foot. He was felt to need IV antibiotics as this has come on abruptly with his multitude of other problems as noted above. Cultures were done in his office. There is some suspicion for Methicillin Resistant Staphylococcus Aureus. His glucose has been up and down. He has had more and more edema that has been controlled with some metolazone as well. A lot of pain in his leg is present. He is in room in no distress at this point other than some pain as noted.   REVIEW OF SYSTEMS: Negative other than what is noted above.   PAST MEDICAL HISTORY:  1. Hypertension.  2. Type 2 diabetes since 1996.  3. Atrial fibrillation.  4. Hypertriglyceridemia.  5. History of positive PPD.  6. Impotence.  7. Severe neuropathy as noted.  8. Sleep apnea on CPAP.  9. Prostate cancer  10. Peripheral vascular disease.  11. Bladder polyps.   PAST SURGICAL HISTORY:  1. Foot surgery multiple times as noted.  2. Prostatic surgery, cryo for cancer.  3. Cataracts.   SOCIAL HISTORY: He is married. Quit smoking in 1990. Does drink beer daily.   PHYSICAL EXAMINATION:  GENERAL: Pleasant, no distress.   VITAL SIGNS: Pulse irregular at 75, respirations 16, afebrile.   HEENT: TMs are clear. OP clear. Nasal mucosa normal.   NECK: No bruits, thyromegaly, adenopathy or jugular venous distention.   CHEST: Clear to auscultation, percussion and inspection.   HEART: Irregularly irregular. No  murmurs, rubs or gallops. Peripheral pulse decreased.   ABDOMEN: Soft, flat, nontender. No hepatosplenomegaly.   EXTREMITIES: 2 to 3+ edema up to his thighs worse on the left. The left foot has severe erythema and a lot of drainage. Sock is actually wet.   LABORATORY, RADIOLOGICAL AND DIAGNOSTIC DATA: Labs pending including culture, CBC, etc. X-rays apparently were negative. MRI is being ordered.   ASSESSMENT AND PLAN:    1. Diabetic foot infection with cellulitis. Agree with needing IV antibiotics. Start Levaquin and Clinda. Could see how he responds to that before adding Vancomyacin versus starting it empirically. Either way is reasonable. Cultures are pending as noted.  2. Cardiomyopathy, congestive heart failure, atrial fibrillation. Marked. We will start the metolazone along with his usual medications for now, see how he responds. May need IV Furosemide, etc.   3. Diabetes. Start sliding scale. Keep on his regular. Consider cutting back metformin if necessary.   ____________________________ Marya AmslerMarshall W. Dareen PianoAnderson, MD mwa:ap D: 08/22/2011 13:30:07 ET           T: 08/22/2011 14:10:32 ET JOB#: [id number] cc: Marya AmslerMarshall W. Dareen PianoAnderson, MD, <Dictator> Lauro RegulusMARSHALL W Nazeer Romney MD ELECTRONICALLY SIGNED 08/23/2011 9:19

## 2014-11-19 NOTE — H&P (Signed)
PATIENT NAME:  Harold Cervantes, Harold Cervantes MR#:  [id number] DATE OF BIRTH:  January 03, 1933  DATE OF ADMISSION:  08/22/2011  HISTORY OF PRESENT ILLNESS: This is a 81 year old male who was referred over this morning by [facility] for a sore on his left great toe. He had been receiving Unna boot treatment for some swelling in the legs. Upon questioning the wife states that he fell a couple of days ago and that is when they noticed the redness and swelling. They were concerned he may have broken something. They also relate that he has not been feeling well over the last week or so. Recently he has started running some fever and chills.   PAST MEDICAL HISTORY:  1. Hypertension. 2. Type 2 diabetes with associated neuropathy.  3. Atrial fibrillation. 4. History of positive PPD, untreated.  5. Hypertriglyceridemia. 6. Impotence.  7. Colon polyp, as seen on colonoscopy in 01/2003.   PAST SURGICAL HISTORY:  1. Open reduction and internal fixation of bilateral heel fractures. 2. Pacemaker insertion. 3. Removal of bone fragment, left great toe.   MEDICATIONS:  1. Advair Diskus 250/50 mcg dose 1 puff as directed twice a day. 2. Carbatrol 200 mg one p.o. twice a day. 3. Diltiazem HCL 180 mg 1 tablet p.o. daily. 4. Fenofibrate 160 mg 1 p.o. daily. 5. Glipizide 5 mg 1/2 tablet twice a day. 6. Janumet [phone] mg 1 p.o. twice a day. 7. Methadone 5 mg 1 tablet p.o. twice a day. 8. Multivitamin 1 daily. 9. Pradaxa 150 mg one p.o. twice a day. 10. Flonase propionate 500 mcg two sprays to both nostrils as directed. 11. Oxybutynin chloride 10 mg 1 tablet daily. 12. Metoprolol tartrate 25 mg 1 tablet p.o. twice a day. 13. Flomax 0.4 mg 1 p.o. daily. 14. Torsemide 5 mg three tablets daily. 15. Hydralazine 25 mg 1 tablet three times daily. 16. Magnesium oxide 400 mg two p.o. three times daily. 17. Ambien 10 mg 1 p.o. at night. 18. Simvastatin 40 mg 1 p.o. daily.  19. Spironolactone 25 mg 1 p.o.  daily. 20. Hydrocodone 5/500 mg one p.o. every six hours p.r.n. pain.  21. Metolazone 5 mg three tablets daily. 22. B12 500 mcg 1 p.o. daily. 23. Gabapentin 300 mg 1 p.o. three times daily.   ALLERGIES: Penicillin, potassium, Actos, Ambien.   FAMILY HISTORY: Unremarkable.   SOCIAL HISTORY: He is married. He lives at home.   REVIEW OF SYSTEMS: He does have significant neuropathy in both lower extremities, some chronic swelling. Recent redness and discoloration in the left lower extremity. The patient has been running some fever and chills for the last couple of days. General malaise. He does have some occasional stomach pain and irritation. No cough or shortness of breath. He does have increased frequency of urination with decreased production. No hearing or vision changes.   PHYSICAL EXAMINATION:   VASCULAR: Pulses are diminished. Capillary filling time appears intact.   NEUROLOGICAL: Loss of protective threshold with monofilament wire. Proprioception is impaired.   INTEGUMENT: Skin is atrophic with absent hair growth. Significant edema and erythema in the entire left foot and lower leg. There is an ulcerative area along the medial aspect of the left great toe joint with a smaller area which is full thickness on the plantar surface approximately 6 to 7 mm diameter.  No deep extension to the bone could be probed. Some skin slough in the first interspace.   MUSCULOSKELETAL: Limited range of motion in the pedal joints. No specific  pain on palpation.   X-RAYS: X-rays taken in the office reveal degenerative changes at the hallux IPJ from previous bone fragment removal. No clear gas in the tissues or destructive changes in the bone was noted. There were noted to be calcified blood vessels.   IMPRESSION:  1. Ulceration of left foot with significant cellulitis.  2. Diabetes with neuropathy.   PLAN: The patient is admitted for IV antibiotics. Consult for Dr. Einar Crow for medical  management and history and physical. We will obtain a MRI for better evaluation of the soft tissues and bone. Begin vancomycin. We will follow the patient accordingly pending the results of the MRI.  ____________________________ Linus Galas, DPM tc:slb D: 08/22/2011 14:05:52 ET T: 08/22/2011 14:18:18 ET JOB#: [id number]  cc: Linus Galas, DPM, <Dictator> Anamaria Dusenbury DPM ELECTRONICALLY SIGNED 09/15/2011 13:29

## 2014-11-19 NOTE — Discharge Summary (Signed)
PATIENT NAME:  Harold EisenmengerBALLARD, Aldine F MR#:  [id number] DATE OF BIRTH:  [birth year]  DATE OF ADMISSION:  08/22/2011 DATE OF DISCHARGE:  08/29/2011  DISCHARGE DIAGNOSES:  1. Osteomyelitis.  2. Peripheral vascular disease, left foot.  3. Type 2 diabetes, difficult to control.  4. Atrial fibrillation.  5. Neuropathy secondary to diabetes, severe and difficult to control,  causing a lot of pain.  6. Hypertension.   DISCHARGE MEDICATIONS:  1. Carbamazepine 200 mg b.i.d.  2. Diltiazem ER 180 daily.  3. Fenofibrate 160 daily.  4. Glipizide XL 2.5 daily.  5. Methadone 5 mg b.i.d.  6. Pradaxa 150 mg b.i.d.  7. Oxybutynin 10 mg daily.  8. Metoprolol 25 mg b.i.d.  9. Flomax 0.4 daily.  10. Torsemide 15 mg daily.  11. Mag-Ox 400 b.i.d.  12. Simvastatin 40 mg daily.  13. Spironolactone 25 daily.  14. Metolazone 5 mg daily.  15. Clindamycin 300 mg IV every 8 hours x4 weeks. 16. Neurontin 300 mg p.o. b.i.d.  17. Levofloxacin 500 mg daily for 4 weeks.  18. Ativan 0.5 mg every 8 hours p.r.n.  19. Sliding-scale insulin before meals and at bedtime.  20. Fluticasone nasal 2 sprays daily.  21. Advair 250/50 1 puff b.i.d.  22. Dulcolax ER 10 mg daily.  23. Percocet 5/325, #60, 1 to 2 every 4 hours p.r.n. pain.  HISTORY AND PHYSICAL: Please see detailed History and Physical done on admission.   HOSPITAL COURSE: Briefly, the patient was admitted with a left diabetic foot infection and known peripheral vascular disease. There was concern about osteomyelitis on a CT scan. Podiatry took him to surgery twice for debridement. However, the bone did not look like it needed to be removed, per Dr. Ether GriffinsFowler, who did admit him from his office to my service. He was given vancomycin, levofloxacin and clindamycin while in the hospital to cover polymicrobial infections and the possibility of methicillin-resistant staph aureus. However, surgical wound cultures came back methicillin-sensitive staph aureus. He has a penicillin  allergy, so he was treated with the above. He also had group B strep growing. This, of course, did not rule out the polymicrobial infection, so the anaerobics will be covered with the clindamycin. He had a left lower extremity ultrasound without deep venous thrombosis. He had a CT scan that showed findings consistent with osteomyelitis. He had had a previous surgery of that area which may swing some of those findings, it is hard to be certain. Vascular saw him as well and did not think Unna boots or other vascular interventions needed to be done. They recommended Ace wraps to help with fluid control as well as a Wound VAC to the left foot which should be continued until discontinued by Dr. Ether GriffinsFowler or Dr. Gilda CreaseSchnier in Podiatry and Vascular, respectively. His white count did improve from leukocytosis in the upper teens to 9400 yesterday's date. His glucose was hard to control. That has been improving since infection became easier to control as well. His heart rate is controlled on his usual regimen. He was nearly hypotensive at times, and at times his diuretics had to be held. Given that, as much as possible it would certainly be helpful to control his edema and his infection. He will be seen as scheduled soon by Dr. Ether GriffinsFowler as well as Dr. Gilda CreaseSchnier.    TIME SPENT: It took approximately 35 minutes to do discharge tasks.     ____________________________ Marya AmslerMarshall W. Dareen PianoAnderson, MD mwa:cbb D: 08/29/2011 08:19:00 ET T: 08/29/2011 08:31:01 ET JOB#: [id number]  cc: Marya Amsler. Dareen Piano, MD, <Dictator> Lauro Regulus MD ELECTRONICALLY SIGNED 08/31/2011 11:22

## 2014-11-19 NOTE — Op Note (Signed)
PATIENT NAME:  Harold Cervantes, Harold Cervantes MR#:  [id number] DATE OF BIRTH:  [birth year]  DATE OF PROCEDURE:  10/23/2011  PREOPERATIVE DIAGNOSIS: Plantar left foot diabetic ulceration.   POSTOPERATIVE DIAGNOSIS: Plantar left foot diabetic ulceration.   PROCEDURE PERFORMED:  1. Wound bed preparation for skin graft placement with debridement.  2. Placement of Apligraf skin graft, plantar left foot.   HEMOSTASIS: Epinephrine infiltrated along incision site with lidocaine.   COMPLICATIONS: None.   SPECIMEN: None.   OPERATIVE INDICATIONS: This is a 81 year old gentleman who has undergone a recent debridement for severe diabetic foot infection. We were able to manage to salvage his lower limb with IV antibiotics, debridement, and wound VAC. The wound VAC allowed us to bring the patient in today for placement of a skin graft as it allowed enough granulation tissue into the region. All risks, benefits, alternatives, and complications associated with the surgery were discussed with the patient and informed consent has been given.   OPERATIVE PROCEDURE: The patient was brought into the operating room and placed on the operating table in the supine position. IV sedation was administered by the anesthesia team. Local sedation was administered as stated above. The left lower extremity was prepped and draped in the usual sterile fashion. Attention was directed to the plantar left foot where the ulceration was noted. This was noted to be 6 x 4 cm in diameter with areas of mainly granulation tissue with scant areas of fibrotic tissue in the central aspect and some deeper areas of deep capsule to the first metatarsophalangeal joint. With a VersaJet an excisional debridement of all of the nonviable tissue was performed down to the deep dermal layer and some subcutaneous tissue and tendinous tissue was noted. After all areas were debrided, small areas were Bovie cauterized as needed and an Apligraf was placed on the plantar  left wound with sterile aseptic technique. This was then covered with a Mepitel and a wound VAC was placed over the top of this for drainage of the wound and healing of the Apligraf to prevent any motion to the area. This will be kept on at 125 mmHg and will not be changed until next week until the graft is completely incorporated and we can begin wound VAC changes under standard process. The left lower leg had a dressing on it. We removed this. There were two small superficial abrasions and Telfa were placed over the top of these. Overall the patient tolerated the procedure and anesthesia well and was transported from the operating room to the PACU with all vital signs stable and neurovascular status intact. He will be readmitted back to his nursing home facility and possibly discharged with wound VAC therapy in place.    ____________________________ Argentina DonovanJustin A. Ether GriffinsFowler, DPM jaf:bjt D: 10/23/2011 12:55:07 ET T: 10/23/2011 13:28:03 ET JOB#: [id number]  cc: Jill AlexandersJustin A. Ether GriffinsFowler, DPM, <Dictator> Kaelum Kissick DPM ELECTRONICALLY SIGNED 10/28/2011 16:12

## 2014-11-19 NOTE — Op Note (Signed)
PATIENT NAME:  Rulon EisenmengerBALLARD, Ho F MR#:  [id number] DATE OF BIRTH:  04/29/1933  DATE OF PROCEDURE:  08/27/2011  PREOPERATIVE DIAGNOSIS: Left foot ulceration with recent abscess status post incision and drainage.   POSTOPERATIVE DIAGNOSIS: Left foot ulceration with recent abscess status post incision and drainage.   PROCEDURE PERFORMED: Full thickness debridement to subcutaneous tissue, left foot, multiple sites.   SURGEON: Chanc Kervin A. Ether GriffinsFowler, DPM  ANESTHESIA: MAC with local.   HEMOSTASIS: None.   COMPLICATIONS: None.   SPECIMEN: None.   OPERATIVE INDICATIONS: This is a [age] year old gentleman who underwent a deep multiarea incision and drainage of abscess in his left foot. He is progressing at this point. We brought him back for a second debridement and incision and drainage of his left foot with excisional debridement of any deeper soft tissue. All risks, benefits, alternatives, and complications associated with the procedure were discussed with the patient and informed consent was given.   DESCRIPTION OF PROCEDURE: Patient was brought into the Operating Room, placed on the operating table in supine position. IV sedation was administered by the anesthesia team. A local block was placed around the patient's first ray and incision site. At this time, the previous noted incision site was opened up, sutures were removed. There was some very mild infectious purulence to the deep web space but most of the infection had subsided at this point. No large loculated fluid collections of pus were noted. The fibrotic tissue was then excised. Excisional debridement was performed with a Versajet of the deeper tissue. This was taken down to good healthy bleeding tissue. The wound measured 10 x 2 cm and was full thickness without overt bone exposed at this time. Afterwards, a wound VAC was then placed after the wound was then irrigated with 300 mL of saline. He will be readmitted to the floor, continued with IV  antibiotics. He will possibly be able to be discharged to a skilled facility. Will have to monitor him in the outpatient clinic. He is still at risk of undergoing a great toe and first ray amputation if we can't get wound closure.  ____________________________ Argentina DonovanJustin A. Ether GriffinsFowler, DPM jaf:cms D: 08/27/2011 15:14:28 ET T: 08/27/2011 15:53:45 ET JOB#: [id number]  cc: Jill AlexandersJustin A. Ether GriffinsFowler, DPM, <Dictator>  Nikaya Nasby DPM ELECTRONICALLY SIGNED 08/28/2011 15:20

## 2014-11-19 NOTE — Op Note (Signed)
PATIENT NAME:  Harold Cervantes, Harold F MR#:  [id number] DATE OF BIRTH:  03-27-1933  DATE OF PROCEDURE:  08/28/2011  PREOPERATIVE DIAGNOSES:  1. Osteomyelitis/cellulitis of the foot with need for long-term IV antibiotics.  2. Diabetes.  3. Peripheral arterial disease.  4. Poor venous access with the patient having pulled his previous PICC line.   POSTOPERATIVE DIAGNOSES: 1. Osteomyelitis/cellulitis of the foot with need for long-term IV antibiotics.  2. Diabetes.  3. Peripheral arterial disease.  4. Poor venous access with the patient having pulled his previous PICC line.   PROCEDURES:  1. Ultrasound guidance for vascular access, right brachial vein.  2. Fluoroscopic guidance for placement of catheter.  3. Placement of a peripherally inserted central venous catheter, right arm.   SURGEON: Annice NeedyJason S. Shakari Qazi, MD   ASSISTANT: Gari Crownhristopher Gibbons, vascular technologist    ANESTHESIA: Local.  ESTIMATED BLOOD LOSS: Minimal.   FLUOROSCOPY TIME: Less than one minute.   CONTRAST USED: None.   INDICATION FOR PROCEDURE: The patient is a 81 year old white male with osteomyelitis of his foot requiring extended IV antibiotics. PICC line is necessary for this reason.   DESCRIPTION OF PROCEDURE: The patient was brought to the Vascular Interventional Radiology Suite. The right arm was sterilely prepped and draped and a sterile surgical field was created. The right brachial vein was visualized on ultrasound guidance and found to be patent. It was then accessed under direct ultrasound guidance with a micropuncture needle. An 0.018 wire was placed. A sheath was placed over the wire and the peripherally inserted central venous catheter was placed through the sheath over the wire and the wire and sheath were removed. The catheter tip was placed in the superior vena cava. It withdrew blood well and flushed easily with heparinized saline. It was secured to the skin at 38 cm with a sterile dressing. The patient  tolerated the procedure well.   ____________________________ Annice NeedyJason S. Norris Brumbach, MD jsd:drc D: 08/28/2011 16:53:56 ET T: 08/29/2011 10:55:32 ET JOB#: [id number]  cc: Annice NeedyJason S. Ayden Hardwick, MD, <Dictator> Annice NeedyJASON S Rhiley Tarver MD ELECTRONICALLY SIGNED 09/15/2011 9:20

## 2016-05-26 ENCOUNTER — Other Ambulatory Visit (INDEPENDENT_AMBULATORY_CARE_PROVIDER_SITE_OTHER): Payer: Self-pay | Admitting: Vascular Surgery

## 2016-05-26 DIAGNOSIS — I70213 Atherosclerosis of native arteries of extremities with intermittent claudication, bilateral legs: Secondary | ICD-10-CM

## 2016-05-26 DIAGNOSIS — L97909 Non-pressure chronic ulcer of unspecified part of unspecified lower leg with unspecified severity: Secondary | ICD-10-CM

## 2016-05-26 DIAGNOSIS — I739 Peripheral vascular disease, unspecified: Secondary | ICD-10-CM

## 2016-05-27 ENCOUNTER — Ambulatory Visit (INDEPENDENT_AMBULATORY_CARE_PROVIDER_SITE_OTHER): Payer: Medicare Other | Admitting: Vascular Surgery

## 2016-05-27 ENCOUNTER — Encounter (INDEPENDENT_AMBULATORY_CARE_PROVIDER_SITE_OTHER): Payer: Self-pay | Admitting: Vascular Surgery

## 2016-05-27 ENCOUNTER — Ambulatory Visit (INDEPENDENT_AMBULATORY_CARE_PROVIDER_SITE_OTHER): Payer: Medicare Other

## 2016-05-27 VITALS — BP 136/85 | HR 73 | Resp 16 | Ht 72.0 in | Wt 228.0 lb

## 2016-05-27 DIAGNOSIS — I739 Peripheral vascular disease, unspecified: Secondary | ICD-10-CM

## 2016-05-27 DIAGNOSIS — I890 Lymphedema, not elsewhere classified: Secondary | ICD-10-CM | POA: Diagnosis not present

## 2016-05-27 DIAGNOSIS — I70213 Atherosclerosis of native arteries of extremities with intermittent claudication, bilateral legs: Secondary | ICD-10-CM | POA: Diagnosis not present

## 2016-05-27 DIAGNOSIS — E118 Type 2 diabetes mellitus with unspecified complications: Secondary | ICD-10-CM

## 2016-05-27 DIAGNOSIS — E785 Hyperlipidemia, unspecified: Secondary | ICD-10-CM

## 2016-05-27 DIAGNOSIS — L97909 Non-pressure chronic ulcer of unspecified part of unspecified lower leg with unspecified severity: Secondary | ICD-10-CM

## 2016-05-27 DIAGNOSIS — M7989 Other specified soft tissue disorders: Secondary | ICD-10-CM

## 2016-05-27 DIAGNOSIS — I10 Essential (primary) hypertension: Secondary | ICD-10-CM

## 2016-05-30 DIAGNOSIS — E785 Hyperlipidemia, unspecified: Secondary | ICD-10-CM | POA: Insufficient documentation

## 2016-05-30 DIAGNOSIS — E119 Type 2 diabetes mellitus without complications: Secondary | ICD-10-CM | POA: Insufficient documentation

## 2016-05-30 DIAGNOSIS — M7989 Other specified soft tissue disorders: Secondary | ICD-10-CM | POA: Insufficient documentation

## 2016-05-30 DIAGNOSIS — I10 Essential (primary) hypertension: Secondary | ICD-10-CM | POA: Insufficient documentation

## 2016-05-30 DIAGNOSIS — I890 Lymphedema, not elsewhere classified: Secondary | ICD-10-CM | POA: Insufficient documentation

## 2016-05-30 DIAGNOSIS — I739 Peripheral vascular disease, unspecified: Secondary | ICD-10-CM | POA: Insufficient documentation

## 2016-05-30 NOTE — Progress Notes (Signed)
MRN : [id number]  Harold Cervantes is a 82 y.o. (June 26, 1933) male who presents with chief complaint of  Chief Complaint  Patient presents with  . Follow-up  .  History of Present Illness: Patient returns in follow up for Peripheral vascular disease as well as leg swelling and lymphedema. His leg swelling is doing pretty good. He still has some significant stasis changes and mild to moderate swelling but no ulceration and markedly improved from previous levels years ago. As per his peripheral vascular disease, he is doing reasonably well. No current limb threatening symptoms. Noninvasive studies show falsely elevated ABIs from medial calcification, but digital waveforms and distal leg waveforms are pretty good.    Past Medical History:  Diagnosis Date  . Diabetes mellitus without complication (HCC)   . Hypertension     Past Surgical History:  Procedure Laterality Date  . PACEMAKER INSERTION    . TOE AMPUTATION Left     Social History Social History  Substance Use Topics  . Smoking status: Former Games developermoker  . Smokeless tobacco: Never Used  . Alcohol use Yes     Family History Family History  Problem Relation Age of Onset  . Cancer Mother   . Cancer Father   . Cancer Sister      Current Outpatient Prescriptions  Medication Sig Dispense Refill  . atorvastatin (LIPITOR) 40 MG tablet Take 40 mg by mouth daily.    . carbamazepine (CARBATROL) 200 MG 12 hr capsule 2 (two) times daily.  1  . diltiazem (DILACOR XR) 180 MG 24 hr capsule Take 180 mg by mouth.    . gabapentin (NEURONTIN) 300 MG capsule Take 300 mg by mouth.    Marland Kitchen. glipiZIDE (GLUCOTROL) 5 MG tablet   0  . magnesium oxide (MAG-OX) 400 MG tablet TAKE 1 TABLET BY MOUTH TWICE DAILY    . metFORMIN (GLUCOPHAGE-XR) 500 MG 24 hr tablet daily.    . metolazone (ZAROXOLYN) 2.5 MG tablet   2  . metoprolol tartrate (LOPRESSOR) 25 MG tablet   1  . RAPAFLO 8 MG CAPS capsule TK 1 C PO QD WITH BREAKFAST  11  . spironolactone  (ALDACTONE) 25 MG tablet TK 1 T PO QD  1  . torsemide (DEMADEX) 5 MG tablet TK 3 TS PO QD  0  . XARELTO 15 MG TABS tablet TK 1 T PO QPM AFTER A MEAL  0   No current facility-administered medications for this visit.     Allergies  Allergen Reactions  . Pioglitazone Swelling  . Penicillins Rash  . Zolpidem Other (See Comments)    confusion     REVIEW OF SYSTEMS (Negative unless checked)  Constitutional: [] Weight loss  [] Fever  [] Chills Cardiac: [] Chest pain   [] Chest pressure   [x] Palpitations   [] Shortness of breath when laying flat   [] Shortness of breath at rest   [] Shortness of breath with exertion. Vascular:  [] Pain in legs with walking   [] Pain in legs at rest   [] Pain in legs when laying flat   [] Claudication   [] Pain in feet when walking  [] Pain in feet at rest  [] Pain in feet when laying flat   [] History of DVT   [] Phlebitis   [x] Swelling in legs   [x] Varicose veins   [] Non-healing ulcers Pulmonary:   [] Uses home oxygen   [] Productive cough   [] Hemoptysis   [] Wheeze  [] COPD    Neurologic:  [] Dizziness  [] Blackouts   [] Seizures   [] History of stroke   []   History of TIA  [] Aphasia   [] Temporary blindness   [] Dysphagia   [] Weakness or numbness in arms   [] Weakness or numbness in legs Musculoskeletal:  [] Arthritis   [] Joint swelling   [] Joint pain   [] Low back pain Hematologic:  [] Easy bruising  [] Easy bleeding   [] Hypercoagulable state   [] Anemic  [] Thrombocytopenia Gastrointestinal:  [] Blood in stool   [] Vomiting blood  [] Gastroesophageal reflux/heartburn   [] Difficulty swallowing. Genitourinary:  [] Chronic kidney disease   [] Difficult urination  [] Frequent urination  [] Burning with urination   [] Blood in urine Skin:  [] Rashes   [] Ulcers   [] Wounds Psychological:  [] History of anxiety   []  History of major depression.  Physical Examination  Vitals:   05/27/16 1518  BP: 136/85  Pulse: 73  Resp: 16  Weight: 103.4 kg (228 lb)  Height: 6' (1.829 m)   Body mass index is 30.92  kg/m. Gen:  WD/WN, NAD, appears Younger than stated age Head: [place]/AT, No temporalis wasting. Ear/Nose/Throat: Hearing grossly intact, trachea midline Eyes: Conjunctiva clear. Sclera non-icteric Neck: Supple.  No JVD. Trachea midline Pulmonary:  Good air movement, respirations not labored, no use of accessory muscles.  Cardiac: RRR, normal S1, S2. Vascular:  Vessel Right Left  Radial Palpable Palpable                                   Gastrointestinal: soft, non-tender/non-distended. No guarding/reflex.  Musculoskeletal: M/S 5/5 throughout.  No deformity or atrophy. 1-2+ bilateral lower extremity edema. Neurologic: Sensation grossly intact in extremities.  Symmetrical.  Speech is fluent. Psychiatric: Judgment intact, Mood & affect appropriate for pt's clinical situation. Dermatologic: No rashes or ulcers noted.  No cellulitis or open wounds. Lymph : No Cervical, Axillary, or Inguinal lymphadenopathy.     Labs No results found for this or any previous visit (from the past 2160 hour(s)).  Radiology No results found.    Assessment/Plan  Swelling of limb Stable.  Doing well with pump and stockings  Lymphedema Stable.  Doing well with pump and stockings  PVD (peripheral vascular disease) (HCC) No current limb threatening symptoms. Noninvasive studies show falsely elevated ABIs from medial calcification, but digital waveforms and distal leg waveforms are pretty good. No role for intervention at current. Plan to continue current medical regimen and recheck in 1 year.    Festus BarrenJason Dew, MD  05/30/2016 3:10 PM    This note was created with Dragon medical transcription system.  Any errors from dictation are purely unintentional

## 2016-05-30 NOTE — Assessment & Plan Note (Signed)
Stable.  Doing well with pump and stockings

## 2016-05-30 NOTE — Assessment & Plan Note (Signed)
No current limb threatening symptoms. Noninvasive studies show falsely elevated ABIs from medial calcification, but digital waveforms and distal leg waveforms are pretty good. No role for intervention at current. Plan to continue current medical regimen and recheck in 1 year.

## 2016-05-30 NOTE — Assessment & Plan Note (Signed)
Stable.  Doing well with pump and stockings 

## 2016-06-30 ENCOUNTER — Ambulatory Visit (INDEPENDENT_AMBULATORY_CARE_PROVIDER_SITE_OTHER): Payer: Medicare Other | Admitting: Vascular Surgery

## 2016-06-30 ENCOUNTER — Encounter (INDEPENDENT_AMBULATORY_CARE_PROVIDER_SITE_OTHER): Payer: Self-pay | Admitting: Vascular Surgery

## 2016-06-30 VITALS — BP 99/60 | HR 74 | Resp 16 | Ht 72.0 in | Wt 230.0 lb

## 2016-06-30 DIAGNOSIS — I890 Lymphedema, not elsewhere classified: Secondary | ICD-10-CM

## 2016-06-30 DIAGNOSIS — L97221 Non-pressure chronic ulcer of left calf limited to breakdown of skin: Secondary | ICD-10-CM

## 2016-06-30 DIAGNOSIS — M7989 Other specified soft tissue disorders: Secondary | ICD-10-CM | POA: Diagnosis not present

## 2016-06-30 NOTE — Progress Notes (Signed)
Subjective:    Patient ID: Harold EisenmengerClarence F Farnworth, male    DOB: 1932-08-31, 82 y.o.   MRN: [id number] Chief Complaint  Patient presents with  . Follow-up   Patient called this AM asking to be seen due to left calf ulcer formation. Patient is diligent in wearing his compression stockings and elevating his legs on a daily basis however noticed an increase in swelling with ulcer development on his the front of his left calf. The ulcer first started as a blister which popped prompting him to call our office for an unna wrap. He denies any other ulcer formation, fever, nausea or vomiting. Denies any erythema to his lower extremity.    Review of Systems  Constitutional: Negative.   HENT: Negative.   Eyes: Negative.   Respiratory: Negative.   Cardiovascular: Positive for leg swelling (Left Lower Extremity).  Gastrointestinal: Negative.   Endocrine: Negative.   Genitourinary: Negative.   Musculoskeletal: Negative.   Skin: Positive for wound (Ulceration on left calf).  Allergic/Immunologic: Negative.   Neurological: Negative.   Hematological: Negative.   Psychiatric/Behavioral: Negative.       Objective:   Physical Exam  Constitutional: He is oriented to person, place, and time. He appears well-developed and well-nourished.  HENT:  Head: Normocephalic and atraumatic.  Right Ear: External ear normal.  Left Ear: External ear normal.  Eyes: EOM are normal. Pupils are equal, round, and reactive to light.  Neck: Normal range of motion.  Cardiovascular: Normal rate, regular rhythm, normal heart sounds and intact distal pulses.   Pulses:      Radial pulses are 2+ on the right side, and 2+ on the left side.       Dorsalis pedis pulses are 2+ on the right side, and 2+ on the left side.       Posterior tibial pulses are 2+ on the right side, and 2+ on the left side.  Pulmonary/Chest: Effort normal and breath sounds normal.  Abdominal: Soft. Bowel sounds are normal.  Musculoskeletal: Normal  range of motion. He exhibits edema (Left Lower Extremity Moderate edema).  Neurological: He is alert and oriented to person, place, and time.  Skin:  3cm x 3cm non-infected ulceration of the front left calf. No cellulitis.   Psychiatric: He has a normal mood and affect. His behavior is normal. Judgment and thought content normal.   BP 99/60   Pulse 74   Resp 16   Ht 6' (1.829 m)   Wt 230 lb (104.3 kg)   BMI 31.19 kg/m   Past Medical History:  Diagnosis Date  . Diabetes mellitus without complication (HCC)   . Hypertension    Social History   Social History  . Marital status: Married    Spouse name: N/A  . Number of children: N/A  . Years of education: N/A   Occupational History  . Not on file.   Social History Main Topics  . Smoking status: Former Games developermoker  . Smokeless tobacco: Never Used  . Alcohol use Yes  . Drug use: No  . Sexual activity: Not on file   Other Topics Concern  . Not on file   Social History Narrative  . No narrative on file   Past Surgical History:  Procedure Laterality Date  . PACEMAKER INSERTION    . TOE AMPUTATION Left    Family History  Problem Relation Age of Onset  . Cancer Mother   . Cancer Father   . Cancer Sister    Allergies  Allergen Reactions  . Pioglitazone Swelling  . Penicillins Rash  . Zolpidem Other (See Comments)    confusion      Assessment & Plan:  Patient called this AM asking to be seen due to left calf ulcer formation. Patient is diligent in wearing his compression stockings and elevating his legs on a daily basis however noticed an increase in swelling with ulcer development on his the front of his left calf. The ulcer first started as a blister which popped prompting him to call our office for an unna wrap. He denies any other ulcer formation, fever, nausea or vomiting. Denies any erythema to his lower extremity.   1. Lymphedema - Worsening Left lower extremity edema with ulcer formation.  Recommend LLE unna  wrap for four weeks Follow up in one month for wound check. Encouraged elevation. Continue compression stockings to right lower extremity.   2. Swelling of limb - Worsening Left lower extremity edema with ulcer formation.  Recommend LLE unna wrap for four weeks Follow up in one month for wound check. Encouraged elevation. Continue compression stockings to right lower extremity.   3. Calf ulcer, left, limited to breakdown of skin (HCC) - New Left lower extremity edema with ulcer formation.  Recommend LLE unna wrap for four weeks Follow up in one month for wound check. Encouraged elevation. Continue compression stockings to right lower extremity.   Current Outpatient Prescriptions on File Prior to Visit  Medication Sig Dispense Refill  . atorvastatin (LIPITOR) 40 MG tablet Take 40 mg by mouth daily.    . carbamazepine (CARBATROL) 200 MG 12 hr capsule 2 (two) times daily.  1  . diltiazem (DILACOR XR) 180 MG 24 hr capsule Take 180 mg by mouth.    . gabapentin (NEURONTIN) 300 MG capsule Take 300 mg by mouth.    Marland Kitchen. glipiZIDE (GLUCOTROL) 5 MG tablet   0  . magnesium oxide (MAG-OX) 400 MG tablet TAKE 1 TABLET BY MOUTH TWICE DAILY    . metFORMIN (GLUCOPHAGE-XR) 500 MG 24 hr tablet daily.    . metolazone (ZAROXOLYN) 2.5 MG tablet   2  . metoprolol tartrate (LOPRESSOR) 25 MG tablet   1  . RAPAFLO 8 MG CAPS capsule TK 1 C PO QD WITH BREAKFAST  11  . spironolactone (ALDACTONE) 25 MG tablet TK 1 T PO QD  1  . torsemide (DEMADEX) 5 MG tablet TK 3 TS PO QD  0  . XARELTO 15 MG TABS tablet TK 1 T PO QPM AFTER A MEAL  0   No current facility-administered medications on file prior to visit.     There are no Patient Instructions on file for this visit. No Follow-up on file.   Jacksen Isip A Jeanean Hollett, PA-C

## 2016-07-08 ENCOUNTER — Encounter (INDEPENDENT_AMBULATORY_CARE_PROVIDER_SITE_OTHER): Payer: Self-pay | Admitting: Vascular Surgery

## 2016-07-08 ENCOUNTER — Ambulatory Visit (INDEPENDENT_AMBULATORY_CARE_PROVIDER_SITE_OTHER): Payer: Medicare Other | Admitting: Vascular Surgery

## 2016-07-08 VITALS — BP 115/63 | HR 78 | Resp 16 | Ht 70.5 in | Wt 228.0 lb

## 2016-07-08 DIAGNOSIS — I890 Lymphedema, not elsewhere classified: Secondary | ICD-10-CM

## 2016-07-08 NOTE — Progress Notes (Signed)
History of Present Illness  There is no documented history at this time  Assessments & Plan   There are no diagnoses linked to this encounter.    Additional instructions  Subjective:  Patient presents with venous ulcer of the Right lower extremity.    Procedure:  3 layer unna wrap was placed Right lower extremity.   Plan:   Follow up in one week.   

## 2016-07-15 ENCOUNTER — Encounter (INDEPENDENT_AMBULATORY_CARE_PROVIDER_SITE_OTHER): Payer: Self-pay

## 2016-07-15 ENCOUNTER — Ambulatory Visit (INDEPENDENT_AMBULATORY_CARE_PROVIDER_SITE_OTHER): Payer: Medicare Other | Admitting: Vascular Surgery

## 2016-07-15 VITALS — BP 112/64 | HR 72 | Resp 16 | Ht 72.0 in | Wt 232.0 lb

## 2016-07-15 DIAGNOSIS — I890 Lymphedema, not elsewhere classified: Secondary | ICD-10-CM | POA: Diagnosis not present

## 2016-07-15 NOTE — Progress Notes (Signed)
History of Present Illness  There is no documented history at this time  Assessments & Plan   There are no diagnoses linked to this encounter.    Additional instructions  Subjective:  Patient presents with venous ulcer of the Right lower extremity.    Procedure:  3 layer unna wrap was placed Right lower extremity.   Plan:   Follow up in one week.   

## 2016-07-22 ENCOUNTER — Encounter (INDEPENDENT_AMBULATORY_CARE_PROVIDER_SITE_OTHER): Payer: Self-pay

## 2016-07-22 ENCOUNTER — Ambulatory Visit (INDEPENDENT_AMBULATORY_CARE_PROVIDER_SITE_OTHER): Payer: Medicare Other | Admitting: Vascular Surgery

## 2016-07-22 VITALS — BP 113/70 | HR 70 | Resp 16 | Wt 229.0 lb

## 2016-07-22 DIAGNOSIS — L97221 Non-pressure chronic ulcer of left calf limited to breakdown of skin: Secondary | ICD-10-CM | POA: Diagnosis not present

## 2016-07-22 NOTE — Progress Notes (Signed)
History of Present Illness  There is no documented history at this time  Assessments & Plan   There are no diagnoses linked to this encounter.    Additional instructions  Subjective:  Patient presents with venous ulcer of the Left lower extremity.    Procedure:  3 layer unna wrap was placed Left lower extremity.   Plan:   Follow up in one week.  

## 2016-07-29 ENCOUNTER — Ambulatory Visit (INDEPENDENT_AMBULATORY_CARE_PROVIDER_SITE_OTHER): Payer: Medicare Other | Admitting: Vascular Surgery

## 2016-07-29 ENCOUNTER — Encounter (INDEPENDENT_AMBULATORY_CARE_PROVIDER_SITE_OTHER): Payer: Self-pay | Admitting: Vascular Surgery

## 2016-07-29 VITALS — BP 118/67 | HR 77 | Resp 16

## 2016-07-29 DIAGNOSIS — M7989 Other specified soft tissue disorders: Secondary | ICD-10-CM | POA: Diagnosis not present

## 2016-07-29 DIAGNOSIS — I890 Lymphedema, not elsewhere classified: Secondary | ICD-10-CM

## 2016-07-29 DIAGNOSIS — E118 Type 2 diabetes mellitus with unspecified complications: Secondary | ICD-10-CM

## 2016-07-29 DIAGNOSIS — I10 Essential (primary) hypertension: Secondary | ICD-10-CM | POA: Diagnosis not present

## 2016-07-29 NOTE — Assessment & Plan Note (Signed)
blood pressure control important in reducing the progression of atherosclerotic disease. On appropriate oral medications.  

## 2016-07-29 NOTE — Progress Notes (Signed)
MRN : [id number]  Harold Cervantes is a 83 y.o. (02-Sep-1932) male who presents with chief complaint of  Chief Complaint  Patient presents with  . Follow-up  .  History of Present Illness: Patient returns today in follow up of His leg swelling and ulceration. After several weeks of Unna boots, his ulcerations have healed and his swelling in the left leg is much better. He continues to wear compression stockings on the right leg with good results. He has no fever or chills or signs of infection.  Current Outpatient Prescriptions  Medication Sig Dispense Refill  . atorvastatin (LIPITOR) 40 MG tablet Take 40 mg by mouth daily.    . carbamazepine (CARBATROL) 200 MG 12 hr capsule 2 (two) times daily.  1  . diltiazem (DILACOR XR) 180 MG 24 hr capsule Take 180 mg by mouth.    . gabapentin (NEURONTIN) 300 MG capsule Take 300 mg by mouth.    Marland Kitchen glipiZIDE (GLUCOTROL) 5 MG tablet   0  . magnesium oxide (MAG-OX) 400 MG tablet TAKE 1 TABLET BY MOUTH TWICE DAILY    . metFORMIN (GLUCOPHAGE-XR) 500 MG 24 hr tablet daily.    . metolazone (ZAROXOLYN) 2.5 MG tablet   2  . metoprolol tartrate (LOPRESSOR) 25 MG tablet   1  . RAPAFLO 8 MG CAPS capsule TK 1 C PO QD WITH BREAKFAST  11  . spironolactone (ALDACTONE) 25 MG tablet TK 1 T PO QD  1  . torsemide (DEMADEX) 5 MG tablet TK 3 TS PO QD  0  . XARELTO 15 MG TABS tablet TK 1 T PO QPM AFTER A MEAL  0   No current facility-administered medications for this visit.     Past Medical History:  Diagnosis Date  . Diabetes mellitus without complication (HCC)   . Hypertension     Past Surgical History:  Procedure Laterality Date  . PACEMAKER INSERTION    . TOE AMPUTATION Left     Social History Social History  Substance Use Topics  . Smoking status: Former Games developer  . Smokeless tobacco: Never Used  . Alcohol use Yes     Family History Family History  Problem Relation Age of Onset  . Cancer Mother   . Cancer Father   . Cancer Sister       Allergies  Allergen Reactions  . Pioglitazone Swelling  . Penicillins Rash  . Zolpidem Other (See Comments)    confusion     REVIEW OF SYSTEMS (Negative unless checked)  Constitutional: [] Weight loss  [] Fever  [] Chills Cardiac: [] Chest pain   [] Chest pressure   [] Palpitations   [] Shortness of breath when laying flat   [] Shortness of breath at rest   [] Shortness of breath with exertion. Vascular:  [] Pain in legs with walking   [] Pain in legs at rest   [] Pain in legs when laying flat   [] Claudication   [] Pain in feet when walking  [] Pain in feet at rest  [] Pain in feet when laying flat   [] History of DVT   [] Phlebitis   [x] Swelling in legs   [] Varicose veins   [x] Non-healing ulcers Pulmonary:   [] Uses home oxygen   [] Productive cough   [] Hemoptysis   [] Wheeze  [] COPD   [] Asthma Neurologic:  [] Dizziness  [] Blackouts   [] Seizures   [] History of stroke   [] History of TIA  [] Aphasia   [] Temporary blindness   [] Dysphagia   [] Weakness or numbness in arms   [] Weakness or numbness in legs Musculoskeletal:  []   Arthritis   [] Joint swelling   [] Joint pain   [] Low back pain Hematologic:  [] Easy bruising  [] Easy bleeding   [] Hypercoagulable state   [] Anemic   Gastrointestinal:  [] Blood in stool   [] Vomiting blood  [] Gastroesophageal reflux/heartburn   [] Abdominal pain Genitourinary:  [] Chronic kidney disease   [] Difficult urination  [] Frequent urination  [] Burning with urination   [] Hematuria Skin:  [] Rashes   [x] Ulcers   [x] Wounds Psychological:  [] History of anxiety   []  History of major depression.  Physical Examination  BP 118/67   Pulse 77   Resp 16  Gen:  WD/WN, NAD Head: [place]/AT, No temporalis wasting. Ear/Nose/Throat: Hearing grossly intact, nares w/o erythema or drainage, trachea midline Eyes: Conjunctiva clear. Sclera non-icteric Neck: Supple.  No JVD.  Pulmonary:  Good air movement, no use of accessory muscles.  Cardiac: RRR, normal S1, S2 Vascular:  Vessel Right Left  Radial  Palpable Palpable                                   Gastrointestinal: soft, non-tender/non-distended. No guarding/reflex.  Musculoskeletal: M/S 5/5 throughout.  1+ bilateral lower extremity edema. Neurologic: Sensation grossly intact in extremities.  Symmetrical.  Speech is fluent.  Psychiatric: Judgment intact, Mood & affect appropriate for pt's clinical situation. Dermatologic: No rashes or ulcers noted.  No cellulitis or open wounds. Previous ulcers on left leg have healed. Lymph : No Cervical, Axillary, or Inguinal lymphadenopathy.      Labs No results found for this or any previous visit (from the past 2160 hour(s)).  Radiology No results found.    Assessment/Plan  Essential hypertension blood pressure control important in reducing the progression of atherosclerotic disease. On appropriate oral medications.   Diabetes (HCC) blood glucose control important in reducing the progression of atherosclerotic disease. Also, involved in wound healing. On appropriate medications.   Lymphedema He needs new pumps for his lymphedema pump. He is going to contact [facility] and if they need any orders from us I will be happy to provide them.  Swelling of limb Swelling and skin breakdown much improved after several weeks in Unna boots. We will come out of the Unna boots today and just going to regular compression stockings. Recheck in one month.    Festus BarrenJason Deshunda Thackston, MD  07/29/2016 10:33 AM    This note was created with Dragon medical transcription system.  Any errors from dictation are purely unintentional

## 2016-07-29 NOTE — Assessment & Plan Note (Signed)
Swelling and skin breakdown much improved after several weeks in Unna boots. We will come out of the Unna boots today and just going to regular compression stockings. Recheck in one month.

## 2016-07-29 NOTE — Assessment & Plan Note (Signed)
He needs new pumps for his lymphedema pump. He is going to contact [facility] and if they need any orders from us I will be happy to provide them.

## 2016-07-29 NOTE — Assessment & Plan Note (Signed)
blood glucose control important in reducing the progression of atherosclerotic disease. Also, involved in wound healing. On appropriate medications.  

## 2016-09-02 ENCOUNTER — Ambulatory Visit (INDEPENDENT_AMBULATORY_CARE_PROVIDER_SITE_OTHER): Payer: Medicare Other | Admitting: Vascular Surgery

## 2016-09-16 ENCOUNTER — Encounter (INDEPENDENT_AMBULATORY_CARE_PROVIDER_SITE_OTHER): Payer: Self-pay

## 2016-09-16 ENCOUNTER — Telehealth (INDEPENDENT_AMBULATORY_CARE_PROVIDER_SITE_OTHER): Payer: Self-pay | Admitting: Vascular Surgery

## 2016-09-16 ENCOUNTER — Ambulatory Visit (INDEPENDENT_AMBULATORY_CARE_PROVIDER_SITE_OTHER): Payer: Medicare Other | Admitting: Vascular Surgery

## 2016-09-16 VITALS — BP 90/60 | HR 73 | Resp 16 | Wt 218.0 lb

## 2016-09-16 DIAGNOSIS — M7989 Other specified soft tissue disorders: Secondary | ICD-10-CM | POA: Diagnosis not present

## 2016-09-16 NOTE — Progress Notes (Signed)
History of Present Illness  There is no documented history at this time  Assessments & Plan   There are no diagnoses linked to this encounter.    Additional instructions  Subjective:  Patient presents with venous ulcer of the Bilateral lower extremity.    Procedure:  3 layer unna wrap was placed Bilateral lower extremity.   Plan:   Follow up in one week.  

## 2016-09-16 NOTE — Telephone Encounter (Signed)
Patient fell last week and has 2 sores on both legs. Alona BeneJoyce said she has tried to wrap them, but thinks he needs to come in for unnas. Call Alona BeneJoyce [phone]

## 2016-09-16 NOTE — Telephone Encounter (Signed)
Patient's wife was called and the patient has an appt today at 1:00 pm for Unna boots. Per Dr. Wyn Quakerew. See below.

## 2016-09-23 ENCOUNTER — Ambulatory Visit (INDEPENDENT_AMBULATORY_CARE_PROVIDER_SITE_OTHER): Payer: Medicare Other | Admitting: Vascular Surgery

## 2016-09-23 ENCOUNTER — Encounter (INDEPENDENT_AMBULATORY_CARE_PROVIDER_SITE_OTHER): Payer: Self-pay | Admitting: Vascular Surgery

## 2016-09-23 VITALS — BP 93/59 | HR 74 | Resp 16 | Ht 72.0 in | Wt 227.0 lb

## 2016-09-23 DIAGNOSIS — I890 Lymphedema, not elsewhere classified: Secondary | ICD-10-CM

## 2016-09-23 DIAGNOSIS — M7989 Other specified soft tissue disorders: Secondary | ICD-10-CM

## 2016-09-23 NOTE — Progress Notes (Signed)
History of Present Illness  There is no documented history at this time  Assessments & Plan   There are no diagnoses linked to this encounter.    Additional instructions  Subjective:  Patient presents with venous ulcer of the Bilateral lower extremity.    Procedure:  3 layer unna wrap was placed Bilateral lower extremity.   Plan:   Follow up in one week.  

## 2016-09-30 ENCOUNTER — Ambulatory Visit (INDEPENDENT_AMBULATORY_CARE_PROVIDER_SITE_OTHER): Payer: Medicare Other | Admitting: Vascular Surgery

## 2016-09-30 ENCOUNTER — Encounter (INDEPENDENT_AMBULATORY_CARE_PROVIDER_SITE_OTHER): Payer: Self-pay | Admitting: Vascular Surgery

## 2016-09-30 VITALS — BP 107/64 | HR 68 | Resp 16 | Wt 229.0 lb

## 2016-09-30 DIAGNOSIS — M7989 Other specified soft tissue disorders: Secondary | ICD-10-CM | POA: Diagnosis not present

## 2016-09-30 DIAGNOSIS — L97221 Non-pressure chronic ulcer of left calf limited to breakdown of skin: Secondary | ICD-10-CM | POA: Diagnosis not present

## 2016-09-30 DIAGNOSIS — I890 Lymphedema, not elsewhere classified: Secondary | ICD-10-CM

## 2016-09-30 NOTE — Progress Notes (Signed)
Subjective:    Patient ID: Harold EisenmengerClarence F Cervantes, male    DOB: [birth year], 83 y.o.   MRN: [id number] Chief Complaint  Patient presents with  . Follow-up    unna check   Patient presents for an unna check. Was placed in unna boots s/p a fall which caused an exacerbation of his edema and a wound to his left calf. Patient and wife state marked improvement in his edema and the status of his wound. Patient has appropriate compression stockings and had been elevating his legs continually. Patient denies any fever, nausea or vomiting.    Review of Systems  Constitutional: Negative.   HENT: Negative.   Eyes: Negative.   Respiratory: Negative.   Cardiovascular: Positive for leg swelling.  Gastrointestinal: Negative.   Endocrine: Negative.   Genitourinary: Negative.   Musculoskeletal: Negative.   Skin: Positive for wound.  Allergic/Immunologic: Negative.   Neurological: Negative.   Hematological: Negative.   Psychiatric/Behavioral: Negative.       Objective:   Physical Exam  Constitutional: He is oriented to person, place, and time. He appears well-developed and well-nourished.  HENT:  Head: Normocephalic and atraumatic.  Right Ear: External ear normal.  Left Ear: External ear normal.  Eyes: Conjunctivae and EOM are normal. Pupils are equal, round, and reactive to light.  Neck: Normal range of motion.  Cardiovascular: Normal rate, regular rhythm, normal heart sounds and intact distal pulses.   Pulses:      Radial pulses are 2+ on the right side, and 2+ on the left side.       Dorsalis pedis pulses are 2+ on the right side, and 2+ on the left side.       Posterior tibial pulses are 2+ on the right side, and 2+ on the left side.  Pulmonary/Chest: Effort normal and breath sounds normal.  Musculoskeletal: Normal range of motion. He exhibits edema (Improved edema. Minimal at most bilaterally.).  Neurological: He is alert and oriented to person, place, and time.  Skin: Skin is warm and  dry.  Left Calf Ulceration has healed.   Psychiatric: He has a normal mood and affect. His behavior is normal. Judgment and thought content normal.   BP 107/64   Pulse 68   Resp 16   Wt 229 lb (103.9 kg)   BMI 31.06 kg/m   Past Medical History:  Diagnosis Date  . Diabetes mellitus without complication (HCC)   . Hypertension    Social History   Social History  . Marital status: Married    Spouse name: N/A  . Number of children: N/A  . Years of education: N/A   Occupational History  . Not on file.   Social History Main Topics  . Smoking status: Former Games developermoker  . Smokeless tobacco: Never Used  . Alcohol use Yes  . Drug use: No  . Sexual activity: Not on file   Other Topics Concern  . Not on file   Social History Narrative  . No narrative on file   Past Surgical History:  Procedure Laterality Date  . PACEMAKER INSERTION    . TOE AMPUTATION Left    Family History  Problem Relation Age of Onset  . Cancer Mother   . Cancer Father   . Cancer Sister    Allergies  Allergen Reactions  . Pioglitazone Swelling  . Penicillins Rash  . Zolpidem Other (See Comments)    confusion      Assessment & Plan:  Patient presents for an unna check.  Was placed in unna boots s/p a fall which caused an exacerbation of his edema and a wound to his left calf. Patient and wife state marked improvement in his edema and the status of his wound. Patient has appropriate compression stockings and had been elevating his legs continually. Patient denies any fever, nausea or vomiting.   1. Lymphedema - Improved Marked improvement in patient lymphedema. Ulceration healed. OK to stop unna wraps bilaterally. Transition to compression and elevation. F/U PRN  2. Swelling of limb - Improved Marked improvement in patient lymphedema. Ulceration healed. OK to stop unna wraps bilaterally. Transition to compression and elevation. F/U PRN  3. Calf ulcer, left, limited to breakdown of skin (HCC)  - Resolved Ulceration healed. OK to stop unna wraps bilaterally. Transition to compression and elevation. F/U PRN  Current Outpatient Prescriptions on File Prior to Visit  Medication Sig Dispense Refill  . atorvastatin (LIPITOR) 40 MG tablet Take 40 mg by mouth daily.    . carbamazepine (CARBATROL) 200 MG 12 hr capsule 2 (two) times daily.  1  . CARTIA XT 180 MG 24 hr capsule TK 1 C PO QD  1  . cyanocobalamin (,VITAMIN B-12,) 1000 MCG/ML injection Inject into the muscle.    . diltiazem (DILACOR XR) 180 MG 24 hr capsule Take 180 mg by mouth.    . gabapentin (NEURONTIN) 300 MG capsule Take 300 mg by mouth.    Marland Kitchen glipiZIDE (GLUCOTROL) 5 MG tablet   0  . magnesium oxide (MAG-OX) 400 MG tablet TAKE 1 TABLET BY MOUTH TWICE DAILY    . metFORMIN (GLUCOPHAGE-XR) 500 MG 24 hr tablet daily.    . metolazone (ZAROXOLYN) 2.5 MG tablet   2  . metoprolol tartrate (LOPRESSOR) 25 MG tablet   1  . potassium chloride (K-DUR) 10 MEQ tablet TK 1 T PO QD  1  . RAPAFLO 8 MG CAPS capsule TK 1 C PO QD WITH BREAKFAST  11  . spironolactone (ALDACTONE) 25 MG tablet TK 1 T PO QD  1  . torsemide (DEMADEX) 5 MG tablet TK 3 TS PO QD  0  . XARELTO 15 MG TABS tablet TK 1 T PO QPM AFTER A MEAL  0   No current facility-administered medications on file prior to visit.     There are no Patient Instructions on file for this visit. No Follow-up on file.   Djuna Frechette A Machi Whittaker, PA-C

## 2016-10-07 ENCOUNTER — Encounter (INDEPENDENT_AMBULATORY_CARE_PROVIDER_SITE_OTHER): Payer: Medicare Other

## 2016-10-07 ENCOUNTER — Ambulatory Visit (INDEPENDENT_AMBULATORY_CARE_PROVIDER_SITE_OTHER): Payer: Medicare Other | Admitting: Vascular Surgery

## 2016-11-08 ENCOUNTER — Emergency Department: Payer: Medicare Other

## 2016-11-08 ENCOUNTER — Encounter: Payer: Self-pay | Admitting: Emergency Medicine

## 2016-11-08 ENCOUNTER — Emergency Department
Admission: EM | Admit: 2016-11-08 | Discharge: 2016-11-08 | Disposition: A | Discharge status: Home / Self Care | Payer: Medicare Other | Attending: Emergency Medicine | Admitting: Emergency Medicine

## 2016-11-08 DIAGNOSIS — W230XXA Caught, crushed, jammed, or pinched between moving objects, initial encounter: Secondary | ICD-10-CM | POA: Insufficient documentation

## 2016-11-08 DIAGNOSIS — Z87891 Personal history of nicotine dependence: Secondary | ICD-10-CM | POA: Insufficient documentation

## 2016-11-08 DIAGNOSIS — Y929 Unspecified place or not applicable: Secondary | ICD-10-CM | POA: Diagnosis not present

## 2016-11-08 DIAGNOSIS — E119 Type 2 diabetes mellitus without complications: Secondary | ICD-10-CM | POA: Diagnosis not present

## 2016-11-08 DIAGNOSIS — Y939 Activity, unspecified: Secondary | ICD-10-CM | POA: Diagnosis not present

## 2016-11-08 DIAGNOSIS — Y999 Unspecified external cause status: Secondary | ICD-10-CM | POA: Diagnosis not present

## 2016-11-08 DIAGNOSIS — Z79899 Other long term (current) drug therapy: Secondary | ICD-10-CM | POA: Insufficient documentation

## 2016-11-08 DIAGNOSIS — L03011 Cellulitis of right finger: Secondary | ICD-10-CM

## 2016-11-08 DIAGNOSIS — I10 Essential (primary) hypertension: Secondary | ICD-10-CM | POA: Diagnosis not present

## 2016-11-08 DIAGNOSIS — S6701XA Crushing injury of right thumb, initial encounter: Secondary | ICD-10-CM | POA: Diagnosis not present

## 2016-11-08 DIAGNOSIS — S6991XA Unspecified injury of right wrist, hand and finger(s), initial encounter: Secondary | ICD-10-CM | POA: Diagnosis present

## 2016-11-08 DIAGNOSIS — Z7984 Long term (current) use of oral hypoglycemic drugs: Secondary | ICD-10-CM | POA: Diagnosis not present

## 2016-11-08 DIAGNOSIS — Z23 Encounter for immunization: Secondary | ICD-10-CM | POA: Insufficient documentation

## 2016-11-08 MED ORDER — TETANUS-DIPHTH-ACELL PERTUSSIS 5-2.5-18.5 LF-MCG/0.5 IM SUSP
INTRAMUSCULAR | Status: AC
  Administered 2016-11-08: 0.5 mL via INTRAMUSCULAR
  Filled 2016-11-08: qty 0.5

## 2016-11-08 MED ORDER — CLINDAMYCIN PHOSPHATE 900 MG/6ML IJ SOLN
600.0000 mg | Freq: Once | INTRAMUSCULAR | Status: AC
  Administered 2016-11-08: 600 mg via INTRAMUSCULAR

## 2016-11-08 MED ORDER — TETANUS-DIPHTH-ACELL PERTUSSIS 5-2.5-18.5 LF-MCG/0.5 IM SUSP
0.5000 mL | Freq: Once | INTRAMUSCULAR | Status: AC
  Administered 2016-11-08: 0.5 mL via INTRAMUSCULAR

## 2016-11-08 MED ORDER — CLINDAMYCIN PHOSPHATE 300 MG/2ML IJ SOLN
INTRAMUSCULAR | Status: AC
  Administered 2016-11-08: 600 mg via INTRAMUSCULAR
  Filled 2016-11-08: qty 2

## 2016-11-08 MED ORDER — CEFUROXIME AXETIL 500 MG PO TABS: 500.0000 mg | ORAL_TABLET | Freq: Two times a day (BID) | ORAL | 0 refills | Status: AC

## 2016-11-08 NOTE — ED Provider Notes (Signed)
[facility] Emergency Department Provider Note   ____________________________________________   None    (approximate)  I have reviewed the triage vital signs and the nursing notes.   HISTORY  Chief Complaint Hand Injury    HPI Harold Cervantes is a 83 y.o. male is here with complaint of injury to his right thumb approximately 5 days ago. Patient states that he shut his thumb in a door. He states he had a blood blister at the base of his thumb after this. He states that he used a needle to bust the blister and had some drainage. Patient states that in last 24 hours he has noticed some increased swelling and more pain. He also states that his blood sugars usually run around 150 however for the last several days it has elevated to around 170-180. Currently he rates his pain as a 5/10.   Past Medical History:  Diagnosis Date  . Diabetes mellitus without complication (HCC)   . Hypertension     Patient Active Problem List   Diagnosis Date Noted  . Calf ulcer, left, limited to breakdown of skin (HCC) 06/30/2016  . Diabetes (HCC) 05/30/2016  . Essential hypertension 05/30/2016  . Hyperlipidemia 05/30/2016  . Swelling of limb 05/30/2016  . Lymphedema 05/30/2016  . PVD (peripheral vascular disease) (HCC) 05/30/2016    Past Surgical History:  Procedure Laterality Date  . PACEMAKER INSERTION    . TOE AMPUTATION Left     Prior to Admission medications   Medication Sig Start Date End Date Taking? Authorizing Provider  atorvastatin (LIPITOR) 40 MG tablet Take 40 mg by mouth daily. 12/04/14   Historical Provider, MD  carbamazepine (CARBATROL) 200 MG 12 hr capsule 2 (two) times daily. 04/21/16   Historical Provider, MD  CARTIA XT 180 MG 24 hr capsule TK 1 C PO QD 08/08/16   Historical Provider, MD  cefUROXime (CEFTIN) 500 MG tablet Take 1 tablet (500 mg total) by mouth 2 (two) times daily. 11/08/16 11/18/16  Tommi Rumps, PA-C  cyanocobalamin (,VITAMIN  B-12,) 1000 MCG/ML injection Inject into the muscle. 01/18/16 01/12/17  Historical Provider, MD  diltiazem (DILACOR XR) 180 MG 24 hr capsule Take 180 mg by mouth. 07/12/12   Historical Provider, MD  gabapentin (NEURONTIN) 300 MG capsule Take 300 mg by mouth. 07/12/12   Historical Provider, MD  glipiZIDE (GLUCOTROL) 5 MG tablet  04/24/16   Historical Provider, MD  magnesium oxide (MAG-OX) 400 MG tablet TAKE 1 TABLET BY MOUTH TWICE DAILY 05/16/16   Historical Provider, MD  metFORMIN (GLUCOPHAGE-XR) 500 MG 24 hr tablet daily. 05/08/16   Historical Provider, MD  metolazone (ZAROXOLYN) 2.5 MG tablet  05/19/16   Historical Provider, MD  metoprolol tartrate (LOPRESSOR) 25 MG tablet  04/04/16   Historical Provider, MD  potassium chloride (K-DUR) 10 MEQ tablet TK 1 T PO QD 08/08/16   Historical Provider, MD  RAPAFLO 8 MG CAPS capsule TK 1 C PO QD WITH BREAKFAST 04/29/16   Historical Provider, MD  spironolactone (ALDACTONE) 25 MG tablet TK 1 T PO QD 05/15/16   Historical Provider, MD  torsemide (DEMADEX) 5 MG tablet TK 3 TS PO QD 03/13/16   Historical Provider, MD  XARELTO 15 MG TABS tablet TK 1 T PO QPM AFTER A MEAL 04/24/16   Historical Provider, MD    Allergies Pioglitazone; Penicillins; and Zolpidem  Family History  Problem Relation Age of Onset  . Cancer Mother   . Cancer Father   . Cancer Sister  Social History Social History  Substance Use Topics  . Smoking status: Former Games developer  . Smokeless tobacco: Never Used  . Alcohol use Yes    Review of Systems Constitutional: No fever/chills Cardiovascular: Denies chest pain.Positive pacemaker. Respiratory: Denies shortness of breath. Genitourinary: Negative for dysuria. Musculoskeletal: Positive right thumb pain.  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 Skin: Positive open wound right thumb. Positive erythema right upper extremity. Neurological: Negative for headaches, focal weakness or numbness. Endocrine:Positive  diabetes. Allergic/Immunilogical: Positive allergies to penicillin. 10-point ROS otherwise negative.  ____________________________________________   PHYSICAL EXAM:  VITAL SIGNS: ED Triage Vitals  Enc Vitals Group     BP 11/08/16 0804 (!) 157/89     Pulse Rate 11/08/16 0804 86     Resp 11/08/16 0804 20     Temp 11/08/16 0804 98.2 F (36.8 C)     Temp Source 11/08/16 0804 Oral     SpO2 11/08/16 0804 95 %     Weight 11/08/16 0807 227 lb (103 kg)     Height 11/08/16 0807 6' (1.829 m)     Head Circumference --      Peak Flow --      Pain Score 11/08/16 0804 5     Pain Loc --      Pain Edu? --      Excl. in GC? --     Constitutional: Alert and oriented. Well appearing and in no acute distress. Eyes: Conjunctivae are normal. PERRL. EOMI. Head: Atraumatic. Nose: No congestion/rhinnorhea. Neck: No stridor.   Cardiovascular: Normal rate, regular rhythm. Grossly normal heart sounds.  Good peripheral circulation. Respiratory: Normal respiratory effort.  No retractions. Lungs CTAB. Musculoskeletal: Examination of the right hand the right palm is slightly swollen with open wound at the base of the nail. No active drainage is noted with time. There is no purulent material. There is however erythema noted on the dorsal aspect of the right thumb and extends up into the forearm. Area is warm to touch. Nonpainful. Area was marked at the most proximal and with a skin marker. Neurologic:  Normal speech and language. No gross focal neurologic deficits are appreciated. No gait instability. Skin:  Skin is warm, dry. Note as above. Psychiatric: Mood and affect are normal. Speech and behavior are normal.  ____________________________________________   LABS (all labs ordered are listed, but only abnormal results are displayed)  Labs Reviewed - No data to display  RADIOLOGY  Right thumb x-ray per radiologist is negative for fracture. I, Tommi Rumps, personally viewed and evaluated these  images (plain radiographs) as part of my medical decision making, as well as reviewing the written report by the radiologist. ____________________________________________   PROCEDURES  Procedure(s) performed: None  Procedures  Critical Care performed: No  ____________________________________________   INITIAL IMPRESSION / ASSESSMENT AND PLAN / ED COURSE  Pertinent labs & imaging results that were available during my care of the patient were reviewed by me and considered in my medical decision making (see chart for details).  Patient was given clindamycin IM while in the emergency room along with updating his tetanus booster. Patient has taken Ceftin in the past without any difficulty. He is given a prescription for Ceftin 500 mg twice a day for 7 days. He is to continue cleaning his thumb twice a day. Area on his arm was marked with a skin marking pen. Patient is to return to the emergency room over the weekend if any severe worsening of his symptoms, fever, high  elevation in his blood sugar or fever. I stressed the importance of taking care of this that he does not lose another  digit due to infection and diabetes.      ____________________________________________   FINAL CLINICAL IMPRESSION(S) / ED DIAGNOSES  Final diagnoses:  Crushing injury of right thumb, initial encounter  Cellulitis of finger of right hand      NEW MEDICATIONS STARTED DURING THIS VISIT:  Discharge Medication List as of 11/08/2016  9:27 AM    START taking these medications   Details  cefUROXime (CEFTIN) 500 MG tablet Take 1 tablet (500 mg total) by mouth 2 (two) times daily., Starting Sat 11/08/2016, Until Tue 11/18/2016, Print         Note:  This document was prepared using Dragon voice recognition software and may include unintentional dictation errors.    Tommi Rumps, PA-C 11/08/16 1117    Nita Sickle, MD 11/10/16 [phone]

## 2016-11-08 NOTE — ED Notes (Signed)
Dressing applied by this RN per PA's instructions, with xeroform directly on wound and gauze to cover.

## 2016-11-08 NOTE — ED Triage Notes (Signed)
States shut R thumb in door on Monday or Tuesday. States had blood blister at base of thumb after wound. Yesterday blister broke. Serous drainage noted.

## 2016-11-08 NOTE — Discharge Instructions (Addendum)
Follow-up with Dr. Dareen Piano or [facility] acute-care if any continued problems. Return to the emergency room over the weekend if any severe worsening of your symptoms such as increased swelling, fever, or increasing redness. Begin taking antibiotics today. Soak thumb and surgical soap for approximately 10-15 minutes twice a day.

## 2016-11-08 NOTE — ED Notes (Signed)
NAD noted at time of D/C. Pt denies questions or concerns. Pt ambulatory to the lobby at this time.  

## 2016-11-08 NOTE — ED Notes (Signed)
Pt's had placed in NS and Surgical scrub mix.

## 2017-05-18 ENCOUNTER — Encounter (INDEPENDENT_AMBULATORY_CARE_PROVIDER_SITE_OTHER): Payer: Self-pay | Admitting: Vascular Surgery

## 2017-05-18 ENCOUNTER — Ambulatory Visit (INDEPENDENT_AMBULATORY_CARE_PROVIDER_SITE_OTHER): Payer: Medicare Other | Admitting: Vascular Surgery

## 2017-05-18 ENCOUNTER — Encounter (INDEPENDENT_AMBULATORY_CARE_PROVIDER_SITE_OTHER): Payer: Medicare Other

## 2017-05-18 ENCOUNTER — Encounter (INDEPENDENT_AMBULATORY_CARE_PROVIDER_SITE_OTHER): Payer: Self-pay

## 2017-05-18 VITALS — BP 93/57 | HR 71 | Resp 16 | Ht 72.0 in | Wt 221.0 lb

## 2017-05-18 DIAGNOSIS — L97211 Non-pressure chronic ulcer of right calf limited to breakdown of skin: Secondary | ICD-10-CM

## 2017-05-18 DIAGNOSIS — I890 Lymphedema, not elsewhere classified: Secondary | ICD-10-CM | POA: Diagnosis not present

## 2017-05-18 NOTE — Progress Notes (Signed)
Subjective:    Patient ID: Harold Cervantes, male    DOB: [birth year], 83 y.o.   MRN: [id number] Chief Complaint  Patient presents with  . Follow-up    right leg blisters   Presents with a chief complaint of "wound to the right leg". Patient last seen in March 2018. Patient presents with history of blister formation about 2 weeks ago. His wife was covering the blisters with gauze and he was wearing his compression stockings. They broke over the weekend prompting him to seek medical care today. Patient continues to elevate his legs and use lymphedema pump at least twice a day for an hour each time. He denies any claudication or rest pain to the lower extremities. He denies any fever, nausea vomiting.   Review of Systems  Constitutional: Negative.   HENT: Negative.   Eyes: Negative.   Respiratory: Negative.   Cardiovascular: Negative.   Gastrointestinal: Negative.   Endocrine: Negative.   Genitourinary: Negative.   Musculoskeletal: Negative.   Skin: Positive for wound.  Allergic/Immunologic: Negative.   Neurological: Negative.   Hematological: Negative.   Psychiatric/Behavioral: Negative.       Objective:   Physical Exam  Constitutional: He is oriented to person, place, and time. He appears well-developed and well-nourished. No distress.  HENT:  Head: Normocephalic and atraumatic.  Eyes: Pupils are equal, round, and reactive to light. Conjunctivae are normal.  Neck: Normal range of motion.  Cardiovascular: Normal rate, regular rhythm, normal heart sounds and intact distal pulses.   Pulses:      Radial pulses are 2+ on the right side, and 2+ on the left side.       Dorsalis pedis pulses are 2+ on the right side, and 2+ on the left side.       Posterior tibial pulses are 2+ on the right side, and 2+ on the left side.  Pulmonary/Chest: Effort normal.  Musculoskeletal: Normal range of motion. He exhibits edema (Mild bilateral edema noted to the right lower extremity).    Neurological: He is alert and oriented to person, place, and time.  Skin: He is not diaphoretic.     Patient with 3 ulcerations noted to the medial aspect of the leg. They are noninfected and limited skin breakdown. There is no drainage. There is no infection. There is no cellulitis. There is severe stasis dermatitis to the right lower extremity.  Psychiatric: He has a normal mood and affect. His behavior is normal. Judgment and thought content normal.  Vitals reviewed.  BP (!) 93/57 (BP Location: Right Arm)   Pulse 71   Resp 16   Ht 6' (1.829 m)   Wt 221 lb (100.2 kg)   BMI 29.97 kg/m   Past Medical History:  Diagnosis Date  . Diabetes mellitus without complication (HCC)   . Hypertension    Social History   Social History  . Marital status: Married    Spouse name: N/A  . Number of children: N/A  . Years of education: N/A   Occupational History  . Not on file.   Social History Main Topics  . Smoking status: Former Games developer  . Smokeless tobacco: Never Used  . Alcohol use Yes  . Drug use: No  . Sexual activity: Not on file   Other Topics Concern  . Not on file   Social History Narrative  . No narrative on file   Past Surgical History:  Procedure Laterality Date  . PACEMAKER INSERTION    . TOE AMPUTATION  Left    Family History  Problem Relation Age of Onset  . Cancer Mother   . Cancer Father   . Cancer Sister    Allergies  Allergen Reactions  . Pioglitazone Swelling  . Penicillins Rash  . Zolpidem Other (See Comments)    confusion      Assessment & Plan:  Presents with a chief complaint of "wound to the right leg". Patient last seen in March 2018. Patient presents with history of blister formation about 2 weeks ago. His wife was covering the blisters with gauze and he was wearing his compression stockings. They broke over the weekend prompting him to seek medical care today. Patient continues to elevate his legs and use lymphedema pump at least twice a  day for an hour each time. He denies any claudication or rest pain to the lower extremities. He denies any fever, nausea vomiting.  1. Lymphedema - Exacerbation Patient with blister formation approximately 2 weeks ago Blisters had subsequently "popped" over the weekend Patient presents today for evaluation Recommend right lower extremity Unna wraps with weekly changes for approximately one month Patient to elevate his legs heart level or higher as much as possible Patient to continue to use lymphedema pump Patient to follow up in 1 month to assess the wound  2. Skin ulcer of right calf, limited to breakdown of skin (HCC) - New As above  Current Outpatient Prescriptions on File Prior to Visit  Medication Sig Dispense Refill  . atorvastatin (LIPITOR) 40 MG tablet Take 40 mg by mouth daily.    . carbamazepine (CARBATROL) 200 MG 12 hr capsule 2 (two) times daily.  1  . diltiazem (DILACOR XR) 180 MG 24 hr capsule Take 180 mg by mouth.    . gabapentin (NEURONTIN) 300 MG capsule Take 300 mg by mouth.    Marland Kitchen. glipiZIDE (GLUCOTROL) 5 MG tablet   0  . magnesium oxide (MAG-OX) 400 MG tablet TAKE 1 TABLET BY MOUTH TWICE DAILY    . metFORMIN (GLUCOPHAGE-XR) 500 MG 24 hr tablet daily.    . metolazone (ZAROXOLYN) 2.5 MG tablet   2  . metoprolol tartrate (LOPRESSOR) 25 MG tablet   1  . potassium chloride (K-DUR) 10 MEQ tablet TK 1 T PO QD  1  . RAPAFLO 8 MG CAPS capsule TK 1 C PO QD WITH BREAKFAST  11  . spironolactone (ALDACTONE) 25 MG tablet TK 1 T PO QD  1  . torsemide (DEMADEX) 5 MG tablet TK 3 TS PO QD  0  . XARELTO 15 MG TABS tablet TK 1 T PO QPM AFTER A MEAL  0  . CARTIA XT 180 MG 24 hr capsule TK 1 C PO QD  1   No current facility-administered medications on file prior to visit.     There are no Patient Instructions on file for this visit. No Follow-up on file.   Dj Senteno A Margart Zemanek, PA-C

## 2017-05-26 ENCOUNTER — Other Ambulatory Visit (INDEPENDENT_AMBULATORY_CARE_PROVIDER_SITE_OTHER): Payer: Self-pay | Admitting: Vascular Surgery

## 2017-05-26 DIAGNOSIS — I739 Peripheral vascular disease, unspecified: Secondary | ICD-10-CM

## 2017-05-27 ENCOUNTER — Other Ambulatory Visit (INDEPENDENT_AMBULATORY_CARE_PROVIDER_SITE_OTHER): Payer: Self-pay | Admitting: Vascular Surgery

## 2017-05-27 ENCOUNTER — Encounter (INDEPENDENT_AMBULATORY_CARE_PROVIDER_SITE_OTHER): Payer: Self-pay

## 2017-05-27 ENCOUNTER — Ambulatory Visit (INDEPENDENT_AMBULATORY_CARE_PROVIDER_SITE_OTHER): Payer: Medicare Other | Admitting: Vascular Surgery

## 2017-05-27 ENCOUNTER — Encounter (INDEPENDENT_AMBULATORY_CARE_PROVIDER_SITE_OTHER): Payer: Self-pay | Admitting: Vascular Surgery

## 2017-05-27 ENCOUNTER — Other Ambulatory Visit (INDEPENDENT_AMBULATORY_CARE_PROVIDER_SITE_OTHER): Payer: Medicare Other

## 2017-05-27 ENCOUNTER — Encounter (INDEPENDENT_AMBULATORY_CARE_PROVIDER_SITE_OTHER): Payer: Medicare Other

## 2017-05-27 ENCOUNTER — Ambulatory Visit (INDEPENDENT_AMBULATORY_CARE_PROVIDER_SITE_OTHER): Payer: Medicare Other

## 2017-05-27 VITALS — BP 118/72 | HR 77 | Resp 18 | Ht 72.0 in | Wt 220.0 lb

## 2017-05-27 DIAGNOSIS — I739 Peripheral vascular disease, unspecified: Secondary | ICD-10-CM

## 2017-05-27 DIAGNOSIS — E785 Hyperlipidemia, unspecified: Secondary | ICD-10-CM | POA: Diagnosis not present

## 2017-05-27 DIAGNOSIS — I890 Lymphedema, not elsewhere classified: Secondary | ICD-10-CM

## 2017-05-27 DIAGNOSIS — E118 Type 2 diabetes mellitus with unspecified complications: Secondary | ICD-10-CM

## 2017-05-27 NOTE — Progress Notes (Signed)
Subjective:    Patient ID: FRIEND DORFMAN, male    DOB: [birth year], 83 y.o.   MRN: [id number] Chief Complaint  Patient presents with  . Follow-up    1 year ABI/Arterial duplex   Patient presents for his yearly peripheral artery disease follow-up. The patient also presents for a monthly Unna boot therapy follow-up. He patient presents without complaint. He denies any claudication-like symptoms, rest pain or new ulceration to the lower extremity. Patient states the Unna wrap therapy to the right lower extremity has improved his edema and he feels his ulceration is also improving. The patient underwent a bilateral ABI which was notable for bilateral biphasic femoral, popliteal, posterior tibial, and anterior tibial waveforms. When compared to the ABI from last year this is stable. The patient denies any fever, nausea or vomiting.   Review of Systems  Constitutional: Negative.   HENT: Negative.   Eyes: Negative.   Respiratory: Negative.   Cardiovascular: Positive for leg swelling.  Gastrointestinal: Negative.   Endocrine: Negative.   Genitourinary: Negative.   Musculoskeletal: Negative.   Skin: Positive for wound.  Allergic/Immunologic: Negative.   Neurological: Negative.   Hematological: Negative.   Psychiatric/Behavioral: Negative.       Objective:   Physical Exam  Constitutional: He is oriented to person, place, and time. He appears well-developed and well-nourished. No distress.  HENT:  Head: Normocephalic and atraumatic.  Eyes: Pupils are equal, round, and reactive to light. Conjunctivae are normal.  Neck: Normal range of motion.  Cardiovascular: Normal rate, regular rhythm, normal heart sounds and intact distal pulses.   Pulses:      Radial pulses are 2+ on the right side, and 2+ on the left side.       Dorsalis pedis pulses are 1+ on the right side, and 1+ on the left side.       Posterior tibial pulses are 1+ on the right side, and 1+ on the left side.    Pulmonary/Chest: Effort normal.  Musculoskeletal: Normal range of motion. He exhibits no edema.  Neurological: He is alert and oriented to person, place, and time.  Skin: He is not diaphoretic.  Right lower extremity: medial calf - there is a 2 cm x 2 cm shallow ulceration noted. The wound bed is full of granulation tissue. There is no cellulitis noted. There is no purulent drainage noted.  Vitals reviewed.  BP 118/72 (BP Location: Right Arm)   Pulse 77   Resp 18   Ht 6' (1.829 m)   Wt 220 lb (99.8 kg)   BMI 29.84 kg/m   Past Medical History:  Diagnosis Date  . Diabetes mellitus without complication (HCC)   . Hypertension    Social History   Social History  . Marital status: Married    Spouse name: N/A  . Number of children: N/A  . Years of education: N/A   Occupational History  . Not on file.   Social History Main Topics  . Smoking status: Former Games developer  . Smokeless tobacco: Never Used  . Alcohol use Yes  . Drug use: No  . Sexual activity: Not on file   Other Topics Concern  . Not on file   Social History Narrative  . No narrative on file   Past Surgical History:  Procedure Laterality Date  . PACEMAKER INSERTION    . TOE AMPUTATION Left    Family History  Problem Relation Age of Onset  . Cancer Mother   . Cancer Father   .  Cancer Sister    Allergies  Allergen Reactions  . Pioglitazone Swelling  . Penicillins Rash  . Zolpidem Other (See Comments)    confusion      Assessment & Plan:  Patient presents for his yearly peripheral artery disease follow-up. The patient also presents for a monthly Unna boot therapy follow-up. He patient presents without complaint. He denies any claudication-like symptoms, rest pain or new ulceration to the lower extremity. Patient states the Unna wrap therapy to the right lower extremity has improved his edema and he feels his ulceration is also improving. The patient underwent a bilateral ABI which was notable for bilateral  biphasic femoral, popliteal, posterior tibial, and anterior tibial waveforms. When compared to the ABI from last year this is stable. The patient denies any fever, nausea or vomiting.  1. PVD (peripheral vascular disease) (HCC) - Stable Stable ABI today when compared to last years. Patient with faint pedal pulses bilaterally Patient is healing his right lower extremity ulceration. No indication for intervention at this time Patient to follow up in one year with repeat ABI  - VAS US ABI WITH/WO TBI; Future  2. Lymphedema - Stable Right lower extremity Unna boot is controlling the patient's edema. There has been slow but steady healing to the right calf ulcer. At this time, I would continue Unna boot therapy to the right lower extremity until the ulceration is completely healed When healed I will transition the patient to compression stockings He expresses understanding  3. Hyperlipidemia, unspecified hyperlipidemia type - Stable Encouraged good control as its slows the progression of atherosclerotic disease  4. Type 2 diabetes mellitus with complication, unspecified whether long term insulin use (HCC) - Stable Encouraged good control as its slows the progression of atherosclerotic disease  Current Outpatient Prescriptions on File Prior to Visit  Medication Sig Dispense Refill  . atorvastatin (LIPITOR) 40 MG tablet Take 40 mg by mouth daily.    . carbamazepine (CARBATROL) 200 MG 12 hr capsule 2 (two) times daily.  1  . CARTIA XT 180 MG 24 hr capsule TK 1 C PO QD  1  . diltiazem (DILACOR XR) 180 MG 24 hr capsule Take 180 mg by mouth.    . gabapentin (NEURONTIN) 300 MG capsule Take 300 mg by mouth.    Marland Kitchen. glipiZIDE (GLUCOTROL) 5 MG tablet   0  . magnesium oxide (MAG-OX) 400 MG tablet TAKE 1 TABLET BY MOUTH TWICE DAILY    . metFORMIN (GLUCOPHAGE-XR) 500 MG 24 hr tablet daily.    . metolazone (ZAROXOLYN) 2.5 MG tablet   2  . metoprolol tartrate (LOPRESSOR) 25 MG tablet   1  . potassium  chloride (K-DUR) 10 MEQ tablet TK 1 T PO QD  1  . RAPAFLO 8 MG CAPS capsule TK 1 C PO QD WITH BREAKFAST  11  . spironolactone (ALDACTONE) 25 MG tablet TK 1 T PO QD  1  . torsemide (DEMADEX) 5 MG tablet TK 3 TS PO QD  0  . XARELTO 15 MG TABS tablet TK 1 T PO QPM AFTER A MEAL  0   No current facility-administered medications on file prior to visit.     There are no Patient Instructions on file for this visit. No Follow-up on file.   Luismario Coston A Samya Siciliano, PA-C

## 2017-06-01 ENCOUNTER — Ambulatory Visit: Payer: Medicare Other | Admitting: Urology

## 2017-06-01 VITALS — BP 107/66 | HR 81 | Ht 72.0 in | Wt 220.6 lb

## 2017-06-01 DIAGNOSIS — R32 Unspecified urinary incontinence: Secondary | ICD-10-CM

## 2017-06-01 LAB — BLADDER SCAN AMB NON-IMAGING: Scan Result: 92

## 2017-06-01 MED ORDER — RAPAFLO 8 MG PO CAPS
ORAL_CAPSULE | ORAL | 11 refills | Status: DC
Start: 2017-06-01 — End: 2017-06-11

## 2017-06-03 ENCOUNTER — Ambulatory Visit (INDEPENDENT_AMBULATORY_CARE_PROVIDER_SITE_OTHER): Payer: Medicare Other | Admitting: Vascular Surgery

## 2017-06-03 ENCOUNTER — Encounter (INDEPENDENT_AMBULATORY_CARE_PROVIDER_SITE_OTHER): Payer: Self-pay

## 2017-06-03 VITALS — BP 93/57 | HR 78 | Resp 16 | Wt 219.0 lb

## 2017-06-03 DIAGNOSIS — I890 Lymphedema, not elsewhere classified: Secondary | ICD-10-CM

## 2017-06-03 NOTE — Progress Notes (Signed)
History of Present Illness  There is no documented history at this time  Assessments & Plan   There are no diagnoses linked to this encounter.    Additional instructions  Subjective:  Patient presents with venous ulcer of the Right lower extremity.    Procedure:  3 layer unna wrap was placed Right lower extremity.   Plan:   Follow up in one week.   

## 2017-06-04 ENCOUNTER — Encounter (INDEPENDENT_AMBULATORY_CARE_PROVIDER_SITE_OTHER): Payer: Self-pay | Admitting: Vascular Surgery

## 2017-06-04 ENCOUNTER — Encounter: Payer: Self-pay | Admitting: Urology

## 2017-06-04 NOTE — Progress Notes (Signed)
06/01/2017 9:52 AM   Rulon Eisenmengerlarence F Elford 06-07-1933 [id number]  Referring provider: Lauro RegulusAnderson, Marshall W, MD [address] [facility] [place], [place] [address]  Chief Complaint  Patient presents with  . Follow-up    HPI: 83 year old male followed for BPH with incomplete bladder emptying and personal history of prostate cancer.  [facility]  He has a prior history of TURP and prostate cryoablation for adenocarcinoma.  Cystoscopy in 2016 showed no significant adenoma, stricture, bladder neck contracture.  His residuals ranged between 150 and 200 mL.  He is on silodosin and feels this helps his voiding symptoms.  Denies dysuria or gross hematuria.  He has no flank, abdominal, pelvic or scrotal pain.   PMH: Past Medical History:  Diagnosis Date  . Diabetes mellitus without complication (HCC)   . Hypertension     Surgical History: Past Surgical History:  Procedure Laterality Date  . PACEMAKER INSERTION    . TOE AMPUTATION Left     Home Medications:  Allergies as of 06/01/2017      Reactions   Pioglitazone Swelling   Penicillins Rash   Zolpidem Other (See Comments)   confusion      Medication List        Accurate as of 06/01/17 11:59 PM. Always use your most recent med list.          ACCU-CHEK SOFTCLIX LANCETS lancets U UTD BID   atorvastatin 40 MG tablet Commonly known as:  LIPITOR Take 40 mg by mouth daily.   carbamazepine 200 MG 12 hr capsule Commonly known as:  CARBATROL 2 (two) times daily.   CARTIA XT 180 MG 24 hr capsule Generic drug:  diltiazem TK 1 C PO QD   cyanocobalamin 1000 MCG/ML injection Commonly known as:  (VITAMIN B-12) Inject into the muscle.   diltiazem 180 MG 24 hr capsule Commonly known as:  DILACOR XR Take 180 mg by mouth.   gabapentin 300 MG capsule Commonly known as:  NEURONTIN Take 300 mg by mouth.   glipiZIDE 5 MG tablet Commonly known as:  GLUCOTROL   magnesium oxide 400  MG tablet Commonly known as:  MAG-OX TAKE 1 TABLET BY MOUTH TWICE DAILY   MAGNESIUM-OXIDE 400 (241.3 Mg) MG tablet Generic drug:  magnesium oxide TK 1 T PO BID   metFORMIN 500 MG 24 hr tablet Commonly known as:  GLUCOPHAGE-XR daily.   metolazone 2.5 MG tablet Commonly known as:  ZAROXOLYN   metoprolol tartrate 25 MG tablet Commonly known as:  LOPRESSOR   potassium chloride 10 MEQ tablet Commonly known as:  K-DUR TK 1 T PO QD   RAPAFLO 8 MG Caps capsule Generic drug:  silodosin TK 1 C PO QD WITH BREAKFAST   spironolactone 25 MG tablet Commonly known as:  ALDACTONE TK 1 T PO QD   torsemide 5 MG tablet Commonly known as:  DEMADEX TK 3 TS PO QD   XARELTO 15 MG Tabs tablet Generic drug:  Rivaroxaban TK 1 T PO QPM AFTER A MEAL       Allergies:  Allergies  Allergen Reactions  . Pioglitazone Swelling  . Penicillins Rash  . Zolpidem Other (See Comments)    confusion    Family History: Family History  Problem Relation Age of Onset  . Cancer Mother   . Cancer Father   . Cancer Sister     Social History:  reports that he has quit smoking. he has never  used smokeless tobacco. He reports that he drinks alcohol. He reports that he does not use drugs.  ROS: UROLOGY Frequent Urination?: Yes Hard to postpone urination?: No Burning/pain with urination?: No Get up at night to urinate?: Yes Leakage of urine?: Yes Urine stream starts and stops?: No Trouble starting stream?: No Do you have to strain to urinate?: No Blood in urine?: No Urinary tract infection?: No Sexually transmitted disease?: No Injury to kidneys or bladder?: No Painful intercourse?: No Weak stream?: No Erection problems?: No Penile pain?: No  Gastrointestinal Nausea?: No Vomiting?: No Indigestion/heartburn?: No Diarrhea?: No Constipation?: Yes  Constitutional Fever: No Night sweats?: No Weight loss?: No Fatigue?: No  Skin Skin rash/lesions?: No Itching?: No  Eyes Blurred  vision?: No Double vision?: No  Ears/Nose/Throat Sore throat?: No Sinus problems?: No  Hematologic/Lymphatic Swollen glands?: No Easy bruising?: No  Cardiovascular Leg swelling?: Yes Chest pain?: No  Respiratory Cough?: No Shortness of breath?: No  Endocrine Excessive thirst?: No  Musculoskeletal Back pain?: No Joint pain?: No  Neurological Headaches?: No Dizziness?: No  Psychologic Depression?: No Anxiety?: No  Physical Exam: BP 107/66 (BP Location: Right Arm, Patient Position: Sitting, Cuff Size: Normal)   Pulse 81   Ht 6' (1.829 m)   Wt 220 lb 9.6 oz (100.1 kg)   BMI 29.92 kg/m   Constitutional:  Alert and oriented, No acute distress. HEENT: [place] AT, moist mucus membranes.  Trachea midline, no masses. Cardiovascular: No clubbing, cyanosis, or edema. Respiratory: Normal respiratory effort, no increased work of breathing. GI: Abdomen is soft, nontender, nondistended, no abdominal masses GU: No CVA tenderness.  Skin: No rashes, bruises or suspicious lesions. Lymph: No cervical or inguinal adenopathy. Neurologic: Grossly intact, no focal deficits, moving all 4 extremities. Psychiatric: Normal mood and affect.  Laboratory Data: Lab Results  Component Value Date   WBC 5.3 07/19/2012   HGB 10.2 (L) 07/19/2012   HCT 29.9 (L) 07/19/2012   MCV 87 07/19/2012   PLT 346 07/19/2012    Lab Results  Component Value Date   CREATININE 1.31 (H) 07/19/2012     Assessment & Plan:    1.  Incomplete bladder emptying Stable.  PVR by bladder scan today was 92 mL.  Silodosin was refilled.  Continue annual follow-up and return as needed for any significant symptom change.  - Bladder Scan (Post Void Residual) in office   Return in about 1 year (around 06/01/2018) for Bladder scan, Recheck.    Riki AltesScott C Stoioff, MD  [facility] [address], [address] [place], [place] [address] [phone]

## 2017-06-08 ENCOUNTER — Inpatient Hospital Stay: Payer: Medicare Other

## 2017-06-08 ENCOUNTER — Emergency Department: Payer: Medicare Other

## 2017-06-08 ENCOUNTER — Inpatient Hospital Stay: Payer: Medicare Other | Admitting: Registered Nurse

## 2017-06-08 ENCOUNTER — Inpatient Hospital Stay
Admission: EM | Admit: 2017-06-08 | Discharge: 2017-06-11 | DRG: 470 | Disposition: A | Discharge status: Skilled Nursing Facility | Payer: Medicare Other | Attending: Internal Medicine | Admitting: Internal Medicine

## 2017-06-08 ENCOUNTER — Other Ambulatory Visit: Payer: Self-pay

## 2017-06-08 ENCOUNTER — Encounter
Admission: EM | Disposition: A | Discharge status: Skilled Nursing Facility | Payer: Self-pay | Source: Home / Self Care | Attending: Internal Medicine

## 2017-06-08 DIAGNOSIS — W010XXA Fall on same level from slipping, tripping and stumbling without subsequent striking against object, initial encounter: Secondary | ICD-10-CM | POA: Diagnosis present

## 2017-06-08 DIAGNOSIS — R339 Retention of urine, unspecified: Secondary | ICD-10-CM | POA: Diagnosis present

## 2017-06-08 DIAGNOSIS — Y92009 Unspecified place in unspecified non-institutional (private) residence as the place of occurrence of the external cause: Secondary | ICD-10-CM

## 2017-06-08 DIAGNOSIS — Z89422 Acquired absence of other left toe(s): Secondary | ICD-10-CM | POA: Diagnosis not present

## 2017-06-08 DIAGNOSIS — Z880 Allergy status to penicillin: Secondary | ICD-10-CM

## 2017-06-08 DIAGNOSIS — Z950 Presence of cardiac pacemaker: Secondary | ICD-10-CM

## 2017-06-08 DIAGNOSIS — I878 Other specified disorders of veins: Secondary | ICD-10-CM | POA: Diagnosis present

## 2017-06-08 DIAGNOSIS — I4891 Unspecified atrial fibrillation: Secondary | ICD-10-CM | POA: Diagnosis present

## 2017-06-08 DIAGNOSIS — Z87891 Personal history of nicotine dependence: Secondary | ICD-10-CM | POA: Diagnosis not present

## 2017-06-08 DIAGNOSIS — I10 Essential (primary) hypertension: Secondary | ICD-10-CM | POA: Diagnosis present

## 2017-06-08 DIAGNOSIS — E876 Hypokalemia: Secondary | ICD-10-CM | POA: Diagnosis present

## 2017-06-08 DIAGNOSIS — S72002A Fracture of unspecified part of neck of left femur, initial encounter for closed fracture: Principal | ICD-10-CM | POA: Diagnosis present

## 2017-06-08 DIAGNOSIS — Z419 Encounter for procedure for purposes other than remedying health state, unspecified: Secondary | ICD-10-CM

## 2017-06-08 DIAGNOSIS — Z7901 Long term (current) use of anticoagulants: Secondary | ICD-10-CM

## 2017-06-08 DIAGNOSIS — I872 Venous insufficiency (chronic) (peripheral): Secondary | ICD-10-CM | POA: Diagnosis present

## 2017-06-08 DIAGNOSIS — Z96649 Presence of unspecified artificial hip joint: Secondary | ICD-10-CM

## 2017-06-08 DIAGNOSIS — Z79899 Other long term (current) drug therapy: Secondary | ICD-10-CM

## 2017-06-08 DIAGNOSIS — E1151 Type 2 diabetes mellitus with diabetic peripheral angiopathy without gangrene: Secondary | ICD-10-CM | POA: Diagnosis present

## 2017-06-08 DIAGNOSIS — S72009A Fracture of unspecified part of neck of unspecified femur, initial encounter for closed fracture: Secondary | ICD-10-CM | POA: Diagnosis present

## 2017-06-08 DIAGNOSIS — Z7984 Long term (current) use of oral hypoglycemic drugs: Secondary | ICD-10-CM | POA: Diagnosis not present

## 2017-06-08 DIAGNOSIS — L97909 Non-pressure chronic ulcer of unspecified part of unspecified lower leg with unspecified severity: Secondary | ICD-10-CM | POA: Diagnosis not present

## 2017-06-08 HISTORY — DX: Unspecified atrial fibrillation: I48.91

## 2017-06-08 HISTORY — PX: HIP ARTHROPLASTY: SHX981

## 2017-06-08 LAB — PROTIME-INR
INR: 1.45
Prothrombin Time: 17.5 seconds — ABNORMAL HIGH (ref 11.4–15.2)

## 2017-06-08 LAB — BASIC METABOLIC PANEL
ANION GAP: 8 (ref 5–15)
BUN: 29 mg/dL — ABNORMAL HIGH (ref 6–20)
CHLORIDE: 99 mmol/L — AB (ref 101–111)
CO2: 29 mmol/L (ref 22–32)
Calcium: 8.1 mg/dL — ABNORMAL LOW (ref 8.9–10.3)
Creatinine, Ser: 0.96 mg/dL (ref 0.61–1.24)
GFR calc Af Amer: >60 mL/min (ref >60)
GFR calc non Af Amer: >60 mL/min (ref >60)
Glucose, Bld: 204 mg/dL — ABNORMAL HIGH (ref 65–99)
POTASSIUM: 3.2 mmol/L — AB (ref 3.5–5.1)
SODIUM: 136 mmol/L (ref 135–145)

## 2017-06-08 LAB — SURGICAL PCR SCREEN
MRSA, PCR: POSITIVE — AB
Staphylococcus aureus: POSITIVE — AB

## 2017-06-08 LAB — CBC
HCT: 40.5 % (ref 40.0–52.0)
Hemoglobin: 13.9 g/dL (ref 13.0–18.0)
MCH: 31.5 pg (ref 26.0–34.0)
MCHC: 34.5 g/dL (ref 32.0–36.0)
MCV: 91.5 fL (ref 80.0–100.0)
PLATELETS: 228 10*3/uL (ref 150–440)
RBC: 4.42 MIL/uL (ref 4.40–5.90)
RDW: 14.3 % (ref 11.5–14.5)
WBC: 7.3 10*3/uL (ref 3.8–10.6)

## 2017-06-08 LAB — GLUCOSE, CAPILLARY
GLUCOSE-CAPILLARY: 146 mg/dL — AB (ref 65–99)
GLUCOSE-CAPILLARY: 202 mg/dL — AB (ref 65–99)
Glucose-Capillary: 193 mg/dL — ABNORMAL HIGH (ref 65–99)
Glucose-Capillary: 288 mg/dL — ABNORMAL HIGH (ref 65–99)

## 2017-06-08 LAB — TYPE AND SCREEN
ABO/RH(D): A POS
ANTIBODY SCREEN: NEGATIVE

## 2017-06-08 SURGERY — HEMIARTHROPLASTY, HIP, DIRECT ANTERIOR APPROACH, FOR FRACTURE
Anesthesia: General | Laterality: Left | Wound class: Clean

## 2017-06-08 MED ORDER — ACETAMINOPHEN 325 MG PO TABS: 650.0000 mg | ORAL_TABLET | Freq: Four times a day (QID) | ORAL | Status: DC | PRN

## 2017-06-08 MED ORDER — DEXAMETHASONE SODIUM PHOSPHATE 10 MG/ML IJ SOLN
INTRAMUSCULAR | Status: AC
  Filled 2017-06-08: qty 1

## 2017-06-08 MED ORDER — DEXTROSE-NACL 5-0.9 % IV SOLN
INTRAVENOUS | Status: DC
  Administered 2017-06-08: 09:00:00 via INTRAVENOUS

## 2017-06-08 MED ORDER — ONDANSETRON HCL 4 MG PO TABS: 4.0000 mg | ORAL_TABLET | Freq: Four times a day (QID) | ORAL | Status: DC | PRN

## 2017-06-08 MED ORDER — ONDANSETRON HCL 4 MG/2ML IJ SOLN: 4.0000 mg | Freq: Four times a day (QID) | INTRAMUSCULAR | Status: DC | PRN

## 2017-06-08 MED ORDER — MAGNESIUM OXIDE 400 (241.3 MG) MG PO TABS
400.0000 mg | ORAL_TABLET | Freq: Two times a day (BID) | ORAL | Status: DC
  Administered 2017-06-08 – 2017-06-11 (×6): 400 mg via ORAL
  Filled 2017-06-08 (×6): qty 1

## 2017-06-08 MED ORDER — CEFAZOLIN SODIUM 1 G IJ SOLR
INTRAMUSCULAR | Status: AC
  Filled 2017-06-08: qty 20

## 2017-06-08 MED ORDER — OXYCODONE HCL 5 MG PO TABS
5.0000 mg | ORAL_TABLET | ORAL | Status: DC | PRN
  Administered 2017-06-08 – 2017-06-09 (×5): 5 mg via ORAL
  Filled 2017-06-08 (×7): qty 1

## 2017-06-08 MED ORDER — ACETAMINOPHEN 500 MG PO TABS
1000.0000 mg | ORAL_TABLET | Freq: Three times a day (TID) | ORAL | Status: AC
  Administered 2017-06-08 – 2017-06-09 (×3): 1000 mg via ORAL
  Filled 2017-06-08 (×3): qty 2

## 2017-06-08 MED ORDER — INSULIN ASPART 100 UNIT/ML ~~LOC~~ SOLN
0.0000 [IU] | Freq: Three times a day (TID) | SUBCUTANEOUS | Status: DC
  Administered 2017-06-08: 8 [IU] via SUBCUTANEOUS
  Administered 2017-06-08 – 2017-06-09 (×4): 3 [IU] via SUBCUTANEOUS
  Administered 2017-06-10 – 2017-06-11 (×4): 5 [IU] via SUBCUTANEOUS
  Administered 2017-06-11: 3 [IU] via SUBCUTANEOUS
  Filled 2017-06-08 (×10): qty 1

## 2017-06-08 MED ORDER — DILTIAZEM HCL ER COATED BEADS 180 MG PO CP24
180.0000 mg | ORAL_CAPSULE | Freq: Every day | ORAL | Status: DC
  Administered 2017-06-11: 180 mg via ORAL
  Filled 2017-06-08 (×4): qty 1

## 2017-06-08 MED ORDER — CLINDAMYCIN PHOSPHATE 900 MG/50ML IV SOLN
INTRAVENOUS | Status: AC
  Filled 2017-06-08: qty 50

## 2017-06-08 MED ORDER — HYDROMORPHONE HCL 1 MG/ML IJ SOLN
0.5000 mg | INTRAMUSCULAR | Status: DC | PRN
Start: 2017-06-08 — End: 2017-06-11

## 2017-06-08 MED ORDER — DOCUSATE SODIUM 100 MG PO CAPS
100.0000 mg | ORAL_CAPSULE | Freq: Two times a day (BID) | ORAL | Status: DC
  Administered 2017-06-08 – 2017-06-11 (×6): 100 mg via ORAL
  Filled 2017-06-08 (×7): qty 1

## 2017-06-08 MED ORDER — FENTANYL CITRATE (PF) 100 MCG/2ML IJ SOLN
INTRAMUSCULAR | Status: AC
  Administered 2017-06-08: 25 ug via INTRAVENOUS
  Filled 2017-06-08: qty 2

## 2017-06-08 MED ORDER — MENTHOL 3 MG MT LOZG
1.0000 | LOZENGE | OROMUCOSAL | Status: DC | PRN
  Filled 2017-06-08: qty 9

## 2017-06-08 MED ORDER — BUPIVACAINE LIPOSOME 1.3 % IJ SUSP
INTRAMUSCULAR | Status: DC | PRN
  Administered 2017-06-08: 50 mL

## 2017-06-08 MED ORDER — SENNOSIDES-DOCUSATE SODIUM 8.6-50 MG PO TABS: 1.0000 | ORAL_TABLET | Freq: Every evening | ORAL | Status: DC | PRN

## 2017-06-08 MED ORDER — ACETAMINOPHEN 10 MG/ML IV SOLN
INTRAVENOUS | Status: DC | PRN
  Administered 2017-06-08: 1000 mg via INTRAVENOUS

## 2017-06-08 MED ORDER — MORPHINE SULFATE (PF) 4 MG/ML IV SOLN
4.0000 mg | Freq: Once | INTRAVENOUS | Status: AC
  Administered 2017-06-08: 4 mg via INTRAVENOUS
  Filled 2017-06-08: qty 1

## 2017-06-08 MED ORDER — ACETAMINOPHEN 10 MG/ML IV SOLN
INTRAVENOUS | Status: AC
  Filled 2017-06-08: qty 100

## 2017-06-08 MED ORDER — SODIUM CHLORIDE 0.9 % IV SOLN
INTRAVENOUS | Status: DC
  Administered 2017-06-08 (×2): via INTRAVENOUS

## 2017-06-08 MED ORDER — METOCLOPRAMIDE HCL 5 MG/ML IJ SOLN: 5.0000 mg | Freq: Three times a day (TID) | INTRAMUSCULAR | Status: DC | PRN

## 2017-06-08 MED ORDER — ACETAMINOPHEN 325 MG PO TABS: 650.0000 mg | ORAL_TABLET | ORAL | Status: DC | PRN

## 2017-06-08 MED ORDER — CHLORHEXIDINE GLUCONATE CLOTH 2 % EX PADS
6.0000 | MEDICATED_PAD | Freq: Every day | CUTANEOUS | Status: DC
  Administered 2017-06-09 – 2017-06-10 (×2): 6 via TOPICAL

## 2017-06-08 MED ORDER — PROPOFOL 10 MG/ML IV BOLUS
INTRAVENOUS | Status: AC
  Filled 2017-06-08: qty 20

## 2017-06-08 MED ORDER — FENTANYL CITRATE (PF) 100 MCG/2ML IJ SOLN
INTRAMUSCULAR | Status: AC
  Filled 2017-06-08: qty 2

## 2017-06-08 MED ORDER — BUPIVACAINE LIPOSOME 1.3 % IJ SUSP
INTRAMUSCULAR | Status: AC
  Filled 2017-06-08: qty 20

## 2017-06-08 MED ORDER — PROPOFOL 10 MG/ML IV BOLUS
INTRAVENOUS | Status: DC | PRN
  Administered 2017-06-08: 150 mg via INTRAVENOUS

## 2017-06-08 MED ORDER — SEVOFLURANE IN SOLN
RESPIRATORY_TRACT | Status: AC
  Filled 2017-06-08: qty 250

## 2017-06-08 MED ORDER — SODIUM CHLORIDE 0.9 % IR SOLN
Status: DC | PRN
  Administered 2017-06-08: 1000 mL

## 2017-06-08 MED ORDER — FENTANYL CITRATE (PF) 100 MCG/2ML IJ SOLN
INTRAMUSCULAR | Status: DC | PRN
  Administered 2017-06-08 (×2): 50 ug via INTRAVENOUS

## 2017-06-08 MED ORDER — POTASSIUM CHLORIDE CRYS ER 20 MEQ PO TBCR
40.0000 meq | EXTENDED_RELEASE_TABLET | Freq: Once | ORAL | Status: DC
  Filled 2017-06-08: qty 2

## 2017-06-08 MED ORDER — ROCURONIUM BROMIDE 50 MG/5ML IV SOLN
INTRAVENOUS | Status: AC
  Filled 2017-06-08: qty 1

## 2017-06-08 MED ORDER — BISACODYL 10 MG RE SUPP
10.0000 mg | Freq: Every day | RECTAL | Status: DC | PRN
  Administered 2017-06-11: 10 mg via RECTAL

## 2017-06-08 MED ORDER — CLINDAMYCIN PHOSPHATE 900 MG/50ML IV SOLN
900.0000 mg | Freq: Four times a day (QID) | INTRAVENOUS | Status: AC
  Administered 2017-06-08 (×2): 900 mg via INTRAVENOUS
  Filled 2017-06-08 (×3): qty 50

## 2017-06-08 MED ORDER — PHENYLEPHRINE HCL 10 MG/ML IJ SOLN
INTRAMUSCULAR | Status: DC | PRN
  Administered 2017-06-08 (×3): 100 ug via INTRAVENOUS
  Administered 2017-06-08: 50 ug via INTRAVENOUS
  Administered 2017-06-08: 150 ug via INTRAVENOUS
  Administered 2017-06-08: 200 ug via INTRAVENOUS

## 2017-06-08 MED ORDER — SUGAMMADEX SODIUM 200 MG/2ML IV SOLN
INTRAVENOUS | Status: AC
  Filled 2017-06-08: qty 2

## 2017-06-08 MED ORDER — SUGAMMADEX SODIUM 200 MG/2ML IV SOLN
INTRAVENOUS | Status: DC | PRN
  Administered 2017-06-08: 200 mg via INTRAVENOUS

## 2017-06-08 MED ORDER — SUGAMMADEX SODIUM 500 MG/5ML IV SOLN
INTRAVENOUS | Status: AC
  Filled 2017-06-08: qty 5

## 2017-06-08 MED ORDER — CLINDAMYCIN PHOSPHATE 900 MG/50ML IV SOLN
900.0000 mg | Freq: Once | INTRAVENOUS | Status: AC
  Administered 2017-06-08: 900 mg via INTRAVENOUS

## 2017-06-08 MED ORDER — METOCLOPRAMIDE HCL 10 MG PO TABS: 5.0000 mg | ORAL_TABLET | Freq: Three times a day (TID) | ORAL | Status: DC | PRN

## 2017-06-08 MED ORDER — GABAPENTIN 300 MG PO CAPS
300.0000 mg | ORAL_CAPSULE | Freq: Two times a day (BID) | ORAL | Status: DC
  Administered 2017-06-08 – 2017-06-11 (×6): 300 mg via ORAL
  Filled 2017-06-08 (×6): qty 1

## 2017-06-08 MED ORDER — DEXAMETHASONE SODIUM PHOSPHATE 10 MG/ML IJ SOLN
INTRAMUSCULAR | Status: DC | PRN
  Administered 2017-06-08: 10 mg via INTRAVENOUS

## 2017-06-08 MED ORDER — ROCURONIUM BROMIDE 100 MG/10ML IV SOLN
INTRAVENOUS | Status: DC | PRN
  Administered 2017-06-08: 10 mg via INTRAVENOUS
  Administered 2017-06-08: 30 mg via INTRAVENOUS
  Administered 2017-06-08: 10 mg via INTRAVENOUS

## 2017-06-08 MED ORDER — ACETAMINOPHEN 650 MG RE SUPP: 650.0000 mg | RECTAL | Status: DC | PRN

## 2017-06-08 MED ORDER — ONDANSETRON HCL 4 MG/2ML IJ SOLN: 4.0000 mg | Freq: Once | INTRAMUSCULAR | Status: DC | PRN

## 2017-06-08 MED ORDER — SUCCINYLCHOLINE CHLORIDE 20 MG/ML IJ SOLN
INTRAMUSCULAR | Status: DC | PRN
  Administered 2017-06-08: 120 mg via INTRAVENOUS

## 2017-06-08 MED ORDER — SUCCINYLCHOLINE CHLORIDE 20 MG/ML IJ SOLN
INTRAMUSCULAR | Status: AC
  Filled 2017-06-08: qty 1

## 2017-06-08 MED ORDER — SODIUM CHLORIDE 0.9 % IJ SOLN
INTRAMUSCULAR | Status: AC
  Filled 2017-06-08: qty 20

## 2017-06-08 MED ORDER — ACETAMINOPHEN 650 MG RE SUPP
650.0000 mg | Freq: Four times a day (QID) | RECTAL | Status: DC | PRN
Start: 2017-06-08 — End: 2017-06-08

## 2017-06-08 MED ORDER — TRANEXAMIC ACID 1000 MG/10ML IV SOLN
INTRAVENOUS | Status: AC
  Filled 2017-06-08: qty 10

## 2017-06-08 MED ORDER — ONDANSETRON HCL 4 MG/2ML IJ SOLN
INTRAMUSCULAR | Status: AC
  Filled 2017-06-08: qty 2

## 2017-06-08 MED ORDER — NEOMYCIN-POLYMYXIN B GU 40-200000 IR SOLN
Status: AC
  Filled 2017-06-08: qty 20

## 2017-06-08 MED ORDER — TRANEXAMIC ACID 1000 MG/10ML IV SOLN
INTRAVENOUS | Status: DC | PRN
  Administered 2017-06-08: 1000 mg via INTRAVENOUS

## 2017-06-08 MED ORDER — ONDANSETRON HCL 4 MG/2ML IJ SOLN
INTRAMUSCULAR | Status: DC | PRN
  Administered 2017-06-08: 4 mg via INTRAVENOUS

## 2017-06-08 MED ORDER — LIDOCAINE HCL (CARDIAC) 20 MG/ML IV SOLN
INTRAVENOUS | Status: DC | PRN
  Administered 2017-06-08: 100 mg via INTRAVENOUS

## 2017-06-08 MED ORDER — PHENOL 1.4 % MT LIQD
1.0000 | OROMUCOSAL | Status: DC | PRN
  Filled 2017-06-08: qty 177

## 2017-06-08 MED ORDER — ATORVASTATIN CALCIUM 20 MG PO TABS
40.0000 mg | ORAL_TABLET | Freq: Every day | ORAL | Status: DC
  Administered 2017-06-09 – 2017-06-11 (×3): 40 mg via ORAL
  Filled 2017-06-08 (×3): qty 2

## 2017-06-08 MED ORDER — ONDANSETRON HCL 4 MG/2ML IJ SOLN
4.0000 mg | Freq: Once | INTRAMUSCULAR | Status: AC
  Administered 2017-06-08: 4 mg via INTRAVENOUS
  Filled 2017-06-08: qty 2

## 2017-06-08 MED ORDER — SODIUM CHLORIDE 0.9 % IV SOLN
INTRAVENOUS | Status: DC | PRN
  Administered 2017-06-08: 10:00:00 via INTRAVENOUS

## 2017-06-08 MED ORDER — MORPHINE SULFATE (PF) 2 MG/ML IV SOLN
2.0000 mg | INTRAVENOUS | Status: DC | PRN
  Administered 2017-06-08: 2 mg via INTRAVENOUS
  Filled 2017-06-08: qty 1

## 2017-06-08 MED ORDER — BUPIVACAINE HCL (PF) 0.5 % IJ SOLN
INTRAMUSCULAR | Status: AC
  Filled 2017-06-08: qty 30

## 2017-06-08 MED ORDER — INSULIN ASPART 100 UNIT/ML ~~LOC~~ SOLN
0.0000 [IU] | Freq: Every day | SUBCUTANEOUS | Status: DC
  Administered 2017-06-08 – 2017-06-10 (×2): 2 [IU] via SUBCUTANEOUS
  Filled 2017-06-08: qty 1

## 2017-06-08 MED ORDER — GLYCOPYRROLATE 0.2 MG/ML IJ SOLN
INTRAMUSCULAR | Status: AC
  Filled 2017-06-08: qty 1

## 2017-06-08 MED ORDER — DIPHENHYDRAMINE HCL 12.5 MG/5ML PO ELIX: 12.5000 mg | ORAL_SOLUTION | ORAL | Status: DC | PRN

## 2017-06-08 MED ORDER — CARBAMAZEPINE ER 200 MG PO TB12
200.0000 mg | ORAL_TABLET | Freq: Two times a day (BID) | ORAL | Status: DC
  Administered 2017-06-08 – 2017-06-11 (×6): 200 mg via ORAL
  Filled 2017-06-08 (×8): qty 1

## 2017-06-08 MED ORDER — MUPIROCIN 2 % EX OINT
1.0000 | TOPICAL_OINTMENT | Freq: Two times a day (BID) | CUTANEOUS | Status: DC
Start: 2017-06-08 — End: 2017-06-11
  Administered 2017-06-08 – 2017-06-11 (×7): 1 via NASAL
  Filled 2017-06-08: qty 22

## 2017-06-08 MED ORDER — OXYCODONE HCL 5 MG PO TABS
10.0000 mg | ORAL_TABLET | ORAL | Status: DC | PRN
  Administered 2017-06-09 – 2017-06-11 (×13): 10 mg via ORAL
  Filled 2017-06-08 (×12): qty 2

## 2017-06-08 MED ORDER — FENTANYL CITRATE (PF) 100 MCG/2ML IJ SOLN
25.0000 ug | INTRAMUSCULAR | Status: DC | PRN
  Administered 2017-06-08: 25 ug via INTRAVENOUS

## 2017-06-08 MED ORDER — HYDROCODONE-ACETAMINOPHEN 5-325 MG PO TABS: 1.0000 | ORAL_TABLET | ORAL | Status: DC | PRN

## 2017-06-08 MED ORDER — LIDOCAINE HCL (PF) 2 % IJ SOLN
INTRAMUSCULAR | Status: AC
  Filled 2017-06-08: qty 10

## 2017-06-08 SURGICAL SUPPLY — 51 items
BLADE SAGITTAL WIDE XTHICK NO (BLADE) ×2 IMPLANT
BLADE SURG SZ10 CARB STEEL (BLADE) ×2 IMPLANT
BNDG COHESIVE 4X5 TAN STRL (GAUZE/BANDAGES/DRESSINGS) ×2 IMPLANT
CANISTER SUCT 1200ML W/VALVE (MISCELLANEOUS) ×2 IMPLANT
CANISTER SUCT 3000ML PPV (MISCELLANEOUS) ×2 IMPLANT
CAPT HIP HEMI 2 ×2 IMPLANT
CHLORAPREP W/TINT 26ML (MISCELLANEOUS) ×2 IMPLANT
DRAPE IMP U-DRAPE 54X76 (DRAPES) ×2 IMPLANT
DRAPE INCISE IOBAN 66X60 STRL (DRAPES) ×2 IMPLANT
DRAPE SHEET LG 3/4 BI-LAMINATE (DRAPES) ×4 IMPLANT
DRAPE SURG 17X11 SM STRL (DRAPES) ×2 IMPLANT
DRAPE TABLE BACK 80X90 (DRAPES) ×2 IMPLANT
DRSG OPSITE POSTOP 4X10 (GAUZE/BANDAGES/DRESSINGS) ×2 IMPLANT
ELECT BLADE 6.5 EXT (BLADE) ×2 IMPLANT
ELECT CAUTERY BLADE 6.4 (BLADE) ×2 IMPLANT
ELECT REM PT RETURN 9FT ADLT (ELECTROSURGICAL) ×2
ELECTRODE REM PT RTRN 9FT ADLT (ELECTROSURGICAL) ×1 IMPLANT
GAUZE PETRO XEROFOAM 1X8 (MISCELLANEOUS) ×2 IMPLANT
GAUZE SPONGE 4X4 12PLY STRL (GAUZE/BANDAGES/DRESSINGS) ×2 IMPLANT
GLOVE SURG ORTHO 8.0 STRL STRW (GLOVE) ×12 IMPLANT
GLOVE SURG SYN 7.5  E (GLOVE) ×4
GLOVE SURG SYN 7.5 E (GLOVE) ×4 IMPLANT
GOWN STRL REUS W/ TWL LRG LVL3 (GOWN DISPOSABLE) ×3 IMPLANT
GOWN STRL REUS W/ TWL XL LVL3 (GOWN DISPOSABLE) IMPLANT
GOWN STRL REUS W/TWL LRG LVL3 (GOWN DISPOSABLE) ×3
GOWN STRL REUS W/TWL XL LVL3 (GOWN DISPOSABLE)
HEMOVAC 400ML (MISCELLANEOUS)
HIP CAPITATED HEMI 2 ×1 IMPLANT
KIT DRAIN HEMOVAC JP 7FR 400ML (MISCELLANEOUS) IMPLANT
KIT RM TURNOVER STRD PROC AR (KITS) ×2 IMPLANT
NDL SAFETY 18GX1.5 (NEEDLE) ×2 IMPLANT
NEEDLE FILTER BLUNT 18X 1/2SAF (NEEDLE) ×1
NEEDLE FILTER BLUNT 18X1 1/2 (NEEDLE) ×1 IMPLANT
NS IRRIG 1000ML POUR BTL (IV SOLUTION) ×2 IMPLANT
PACK HIP PROSTHESIS (MISCELLANEOUS) ×2 IMPLANT
PILLOW ABDUC SM (MISCELLANEOUS) ×2 IMPLANT
PULSAVAC PLUS IRRIG FAN TIP (DISPOSABLE) ×2
RETRIEVER SUT HEWSON (MISCELLANEOUS) IMPLANT
SLEEVE PROTECTION STRL DISP (MISCELLANEOUS) ×4 IMPLANT
SOL .9 NS 3000ML IRR  AL (IV SOLUTION) ×1
SOL .9 NS 3000ML IRR UROMATIC (IV SOLUTION) ×1 IMPLANT
STAPLER SKIN PROX 35W (STAPLE) ×2 IMPLANT
STRIP CLOSURE SKIN 1/2X4 (GAUZE/BANDAGES/DRESSINGS) ×4 IMPLANT
SUT ETHIBOND #5 BRAIDED 30INL (SUTURE) ×2 IMPLANT
SUT VIC AB 0 CT1 36 (SUTURE) ×2 IMPLANT
SUT VIC AB 2-0 CT2 27 (SUTURE) ×4 IMPLANT
SYR 20CC LL (SYRINGE) ×2 IMPLANT
TAPE MICROFOAM 4IN (TAPE) ×2 IMPLANT
TAPE TRANSPORE STRL 2 31045 (GAUZE/BANDAGES/DRESSINGS) ×2 IMPLANT
TIP BRUSH PULSAVAC PLUS 24.33 (MISCELLANEOUS) ×2 IMPLANT
TIP FAN IRRIG PULSAVAC PLUS (DISPOSABLE) ×1 IMPLANT

## 2017-06-08 NOTE — Anesthesia Preprocedure Evaluation (Addendum)
Anesthesia Evaluation  Patient identified by MRN, date of birth, ID band Patient awake    Reviewed: Allergy & Precautions, H&P , NPO status , Patient's Chart, lab work & pertinent test results, reviewed documented beta blocker date and time   Airway Mallampati: II  TM Distance: >3 FB Neck ROM: full    Dental  (+) Teeth Intact   Pulmonary neg pulmonary ROS, former smoker,    Pulmonary exam normal        Cardiovascular Exercise Tolerance: Poor hypertension, On Medications + Peripheral Vascular Disease  negative cardio ROS Normal cardiovascular exam+ dysrhythmias (hx of) Atrial Fibrillation + pacemaker  Rhythm:regular Rate:Normal     Neuro/Psych negative neurological ROS  negative psych ROS   GI/Hepatic negative GI ROS, Neg liver ROS,   Endo/Other  negative endocrine ROSdiabetes  Renal/GU negative Renal ROS  negative genitourinary   Musculoskeletal   Abdominal   Peds  Hematology negative hematology ROS (+)   Anesthesia Other Findings K 3.2 NA- 136  PT-17.5 INR-1.45  EKG Vpaced  Took Xarelto 06/07/17  Reproductive/Obstetrics negative OB ROS                            Anesthesia Physical Anesthesia Plan  ASA: III and emergent  Anesthesia Plan: General ETT   Post-op Pain Management:    Induction:   PONV Risk Score and Plan: 0, 1, 2 and 3  Airway Management Planned:   Additional Equipment:   Intra-op Plan:   Post-operative Plan:   Informed Consent: I have reviewed the patients History and Physical, chart, labs and discussed the procedure including the risks, benefits and alternatives for the proposed anesthesia with the patient or authorized representative who has indicated his/her understanding and acceptance.   Dental Advisory Given  Plan Discussed with: CRNA  Anesthesia Plan Comments:         Anesthesia Quick Evaluation

## 2017-06-08 NOTE — Op Note (Signed)
DATE OF SURGERY: 06/08/2017  PREOPERATIVE DIAGNOSIS: Left femoral neck fracture  POSTOPERATIVE DIAGNOSIS: Left  femoral neck fracture  PROCEDURE: Left hip hemiarthroplasty  SURGEON: Rosealee AlbeeSunny H. Edwena Mayorga, MD  ASSISTANTS:   EBL: 700 cc  COMPONENTS:  Stryker - Accolade II, Size #6, 127 degree stem Stryker - Unitrax 55mm head with +4 offset neck   INDICATIONS: Rulon EisenmengerClarence F Lalla is a 83 y.o. male who sustained a displaced femoral neck fracture after a fall. Risks and benefits of hip hemiarthroplasty were explained to the patient. Risks include but are not limited to bleeding, infection, injury to tissues, nerves, vessels, periprosthetic infection, dislocation, limb length discrepancy and risks of anesthesia. He understands these risks, has completed an informed consent and wishes to proceed.   PROCEDURE:  The patient was identified in the preoperative holding area and the operative extremity was marked.  The patient was then transferred to the operating room suite and mobilized from the hospital gurney to the operating room table. Anesthesia was administered without complication.  A Foley catheter was placed. The patient was then transitioned to a lateral position.  All bony prominences were padded per protocol.  An axillary roll was placed.  Careful attention was paid to the contralateral side peroneal nerve, which was free from pressure with use of appropriate padding and blankets. A time-out was performed to confirm the patient's identity and the correct laterality of surgery. The patient was then prepped and draped in the usual sterile fashion. Appropriate pre-operative antibiotics were administered.   An incision that centered on the posterior tip of the greater trochanter with a posterior curve was made. Dissection was carried down through the subcutaneous tissue.  Careful attention was made to maintain hemostasis using electrocautery.  Dissection brought us to the level of the deep fascia  where the gluteus maximus muscle and proximal portion of the IT band were identified.  The proximal region of the IT band was incised in linear fashion and this incision was extended proximally in a curvilinear fashion to split the gluteus maximus muscle parallel to its fibers to minimize bleeding.  This was accomplished using a combination of bovie electrocautery as well as blunt dissection.  The trochanteric bursa was then visualized and dissected from anterior to posterior. A blunt homan retractor was placed beneath the abductors.  The piriformis tendon and short external rotators were visualized.Bovie electrocautery was used to cut these with the capsule as one L-shaped flap. This was tagged at the corner with #5 Ethibond. At this point, the femoral neck fracture was visualized. An oscillating saw was used to make a new neck cut approximately 15mm about the lesser tuberosity with the use of a neck cut guide. The head was then freed from its remaining soft tissue attachments and measured. The head trial was then inserted into the acetabulum and a 55mm head felt to be appropriate for placement in the hip   We then turned our attention to preparing the femoral canal. First, a box cut was performed utilizing the box osteotome. A canal finder was inserted by hand and sequential broaching was then performed to size #6. The calcar planer was inserted onto the broach and used to smooth the calcar appropriately.  A trial stem, neutral neck, and head were inserted into the acetabulum and placed through range of motion. Intraoperative radiographs showed the operative leg to be slightly shortened. A +184mm offset was used and this demonstrated improved stability and leg length. The hip was again dislocated and the femoral trial components  were removed.  The #6 stem was inserted into the femoral canal and then driven onto the calcar. The trial head was then again inserted on the femoral component and found to be  appropriate. They were then removed and the permanent 55mm head and +4 neck and was Morse tapered onto the femoral stem and then reduced into the acetabulum.   The hip stability and length were reassessed and found to be satisfactory.  The wound was then copiously irrigated with normal saline solution. The tagged sutures of the capsule and piriformis were sewn to the gluteus medius tendon. This adequately closed the hip capsule. The IT band and gluteus maximus fascia were then closed with 0-Vicryl in a running, locked fashion. A mixture of Exparil and bupivicaine was administered.  The subdermal layer was closed with 2-0 Vicryl in a buried interrupted fashion. Skin was approximated 4-0 monocryl.  The wound was then dressed with steristrips, xeroform, and Honeycomb dressing.  An abduction pillow was placed. The patient was mobilized from the lateral position back to supine on the operating room table and then awakened from general anesthesia without complication.  POSTOPERATIVE PLAN: The patient will be WBAT on operative extremity. Resume home Xarelto on POD#1 for DVT ppx.  Clindamycin x 24 hours. PT/OT on POD#1.

## 2017-06-08 NOTE — Progress Notes (Signed)
Pt arrived to unit from PACU. Honeycomb dressing to L hip CDI, ice pack in place. Pt educated on posterior hip precautions and unit routine. Pt refused to be transferred at this time to a low bed. Pt has an unna boot to his RLE that Dr. Wyn Quakerew placed last Wednesday (due to be changed this Wednesday). Ordered vascular consult per verbal order from Dr. Elisabeth PigeonVachhani.   [place], Latricia HeftKorie G

## 2017-06-08 NOTE — ED Notes (Signed)
Pt to xray via stretcher with [employer] IncEddie

## 2017-06-08 NOTE — ED Notes (Signed)
Dr Zenda AlpersWebster available and discussed pt's c/o pain; verbal order for pain medication received

## 2017-06-08 NOTE — ED Notes (Signed)
Pt says he normally uses a walker or cane; this am he was up, using neither, and fell; says he just lost his footing; c/o pain to left upper leg and left knee; unable to move left leg; decreased sensation to bilateral lower legs, history of neuropathy; pulses palpable; skin warm and dry; family x 2 at bedside

## 2017-06-08 NOTE — Progress Notes (Signed)
Patient taken to surgery via room bed. Family at bedside.

## 2017-06-08 NOTE — H&P (Signed)
H&P reviewed. No significant changes.

## 2017-06-08 NOTE — Consult Note (Signed)
ORTHOPAEDIC CONSULTATION  REQUESTING PHYSICIAN: Altamese DillingVachhani, Vaibhavkumar, *  Chief Complaint:   L hip pain  History of Present Illness: Harold Cervantes is a 83 y.o. male with a known history of atrial fibrillation, diabetes mellitus, and hypertension  presenting to the emergency room after a fall ~3am earlier today. He turned around, lost balance, and fell on his left hip. Patient has pain which is aching in nature and 8/10 in severity. He was unable to ambulate. He ambulates with a walker and cane at baseline. No prior history of injury to hip. X-rays in the emergency room showed displaced left femoral neck fracture. He is on Xarelto for A-fib and took last dose at 6pm on 06/07/17.    Past Medical History:  Diagnosis Date  . A-fib (HCC)   . Diabetes mellitus without complication (HCC)   . Hypertension    Past Surgical History:  Procedure Laterality Date  . PACEMAKER INSERTION    . TOE AMPUTATION Left   . TOE AMPUTATION Left    Social History   Socioeconomic History  . Marital status: Married    Spouse name: None  . Number of children: None  . Years of education: None  . Highest education level: None  Social Needs  . Financial resource strain: None  . Food insecurity - worry: None  . Food insecurity - inability: None  . Transportation needs - medical: None  . Transportation needs - non-medical: None  Occupational History  . Occupation: retired  Tobacco Use  . Smoking status: Former Games developermoker  . Smokeless tobacco: Never Used  Substance and Sexual Activity  . Alcohol use: No    Frequency: Never  . Drug use: No  . Sexual activity: No  Other Topics Concern  . None  Social History Narrative  . None   Family History  Problem Relation Age of Onset  . Cancer Mother   . Cancer Father   . Cancer Sister    Allergies  Allergen Reactions  . Pioglitazone Swelling  . Penicillins Rash  . Zolpidem Other (See  Comments)    confusion   Prior to Admission medications   Medication Sig Start Date End Date Taking? Authorizing Provider  atorvastatin (LIPITOR) 40 MG tablet Take 40 mg by mouth daily. 12/04/14  Yes [provider]  bisacodyl (DULCOLAX) 5 MG EC tablet Take 15 mg every 3 (three) days by mouth.   Yes [provider]  carbamazepine (CARBATROL) 200 MG 12 hr capsule Take 200 mg 2 (two) times daily by mouth.  04/21/16  Yes [provider]  CARTIA XT 180 MG 24 hr capsule Take 1 capsule by mouth daily 08/08/16  Yes [provider]  gabapentin (NEURONTIN) 300 MG capsule Take 300 mg 2 (two) times daily by mouth.  07/12/12  Yes [provider]  glipiZIDE (GLUCOTROL) 5 MG tablet Take 5 mg 2 (two) times daily before a meal by mouth.  04/24/16  Yes [provider]  magnesium oxide (MAG-OX) 400 MG tablet TAKE 1 TABLET BY MOUTH TWICE DAILY 05/16/16  Yes [provider]  metFORMIN (GLUCOPHAGE-XR) 500 MG 24 hr tablet Take 500 mg daily by mouth.  05/08/16  Yes [provider]  metolazone (ZAROXOLYN) 2.5 MG tablet Take 2.5 mg daily by mouth.  05/19/16  Yes [provider]  metoprolol tartrate (LOPRESSOR) 25 MG tablet Take 25 mg 2 (two) times daily by mouth.  04/04/16  Yes [provider]  potassium chloride (K-DUR) 10 MEQ tablet Take one tablet  by mouth daily 08/08/16  Yes [provider]  RAPAFLO 8 MG CAPS capsule TK 1 C PO QD WITH BREAKFAST Patient taking differently: Take 8 mg daily with breakfast by mouth.  06/01/17  Yes Stoioff, Verna CzechScott C, MD  spironolactone (ALDACTONE) 25 MG tablet 12.5 mg by mouth daily 05/15/16  Yes [provider]  torsemide (DEMADEX) 5 MG tablet Take one tablet by mouth 3 times daily 03/13/16  Yes [provider]  XARELTO 15 MG TABS tablet Take one tablet by mouth daily 04/24/16  Yes [provider]  ACCU-CHEK SOFTCLIX LANCETS lancets U UTD BID 04/01/17   [provider]  cyanocobalamin (,VITAMIN B-12,) 1000 MCG/ML injection Inject into the muscle. 02/17/17 02/12/18  [provider]   Dg Chest Portable 1 View  Result Date: 06/08/2017 CLINICAL DATA:  LEFT hip pain after fall. EXAM: PORTABLE CHEST 1 VIEW COMPARISON:  Chest radiograph February 26, 2012 FINDINGS: Cardiac silhouette is moderate to severely enlarged and unchanged. Calcified aortic knob. LEFT cardiac pacemaker single lead tip projects an RIGHT ventricle. Chronic mild hyperinflation. No pleural effusion or focal consolidation. No pneumothorax. Osteopenia. Soft tissue planes are unremarkable. IMPRESSION: Stable cardiomegaly. Chronic mild hyperinflation.  No acute pulmonary process. Electronically Signed   By: Awilda Metroourtnay  Bloomer M.D.   On: 06/08/2017 06:15   Dg Knee Complete 4 Views Left  Result Date: 06/08/2017 CLINICAL DATA:  Status post fall, with left knee pain. Initial encounter. EXAM: LEFT KNEE - COMPLETE 4+ VIEW COMPARISON:  None. FINDINGS: There is no evidence of fracture or dislocation. The joint spaces are preserved. No significant degenerative change is seen; the patellofemoral joint is grossly unremarkable in appearance. No significant joint effusion is seen. Scattered vascular calcifications are seen. IMPRESSION: 1. No evidence of fracture or dislocation. 2. Scattered vascular calcifications seen. Electronically Signed   By: Roanna RaiderJeffery  Chang M.D.   On: 06/08/2017 06:46   Dg Hip Unilat W Or Wo Pelvis 2-3 Views Left  Result Date: 06/08/2017 CLINICAL DATA:  LEFT hip pain after fall this morning. EXAM: DG HIP (WITH OR WITHOUT PELVIS) 2-3V LEFT COMPARISON:  None. FINDINGS: Acute LEFT femoral neck fracture with impaction, varus angulation distal bony fragments. No dislocation. No destructive bony lesions. Osteopenia. Aortoiliac calcifications. IMPRESSION: Acute displaced LEFT femoral neck fracture.  No dislocation. Aortic Atherosclerosis (ICD10-I70.0). Electronically Signed   By: Awilda Metroourtnay  Bloomer  M.D.   On: 06/08/2017 06:13    Positive ROS: All other systems have been reviewed and were otherwise negative with the exception of those mentioned in the HPI and as above.  Physical Exam: General:  Alert, no acute distress Psychiatric:  Patient is competent for consent with normal mood and affect   Cardiovascular:  No pedal edema, irregular rate and rhythm Respiratory:  No wheezing, non-labored breathing, chest sounds clear GI:  Abdomen is soft and non-tender Skin:  No lesions in the area of chief complaint, no erythema Neurologic:  Sensation NOT intact distally, motor grossly intact Lymphatic:  No axillary or cervical lymphadenopathy  Orthopedic Exam:  LLE: - +logroll and axial load - leg ER/shortened position - able to PF/DF foot and wiggle toes - no sensation distally over foot extending to mid-calf (hx of diabetic neuropathy) - foot wwp  X-rays:  As above - displaced L femoral neck fracture Knee XR - no acute findings  Assessment: 83 yo M w/displaced L femoral neck fracture. Last took xarelto on 06/07/17 at 6pm.  Plan: 1. Plan for OR for L hip hemiarthroplasty today given availability.  Xarelto not contraindication to surgery itself. 2. NPO until OR 3. Bedrest until after OR 4. Admit to hospitalist service for medical co-management.     Signa Kell   06/08/2017 9:30 AM

## 2017-06-08 NOTE — Anesthesia Post-op Follow-up Note (Signed)
Anesthesia QCDR form completed.        

## 2017-06-08 NOTE — Anesthesia Procedure Notes (Signed)
Procedure Name: Intubation Date/Time: 06/08/2017 10:28 AM Performed by: Ginger CarneMichelet, Seanna Sisler, CRNA Pre-anesthesia Checklist: Patient identified, Emergency Drugs available, Suction available, Patient being monitored and Timeout performed Patient Re-evaluated:Patient Re-evaluated prior to induction Oxygen Delivery Method: Circle system utilized Preoxygenation: Pre-oxygenation with 100% oxygen Induction Type: IV induction Ventilation: Mask ventilation with difficulty and Two handed mask ventilation required Laryngoscope Size: McGraph and 3 Grade View: Grade I Tube type: Oral Tube size: 7.5 mm Number of attempts: 1 Airway Equipment and Method: Stylet and Video-laryngoscopy Placement Confirmation: ETT inserted through vocal cords under direct vision,  positive ETCO2 and breath sounds checked- equal and bilateral Secured at: 22 cm Tube secured with: Tape Dental Injury: Teeth and Oropharynx as per pre-operative assessment  Difficulty Due To: Difficulty was anticipated, Difficult Airway- due to anterior larynx and Difficult Airway- due to reduced neck mobility Future Recommendations: Recommend- induction with short-acting agent, and alternative techniques readily available

## 2017-06-08 NOTE — Care Management Note (Signed)
Case Management Note  Patient Details  Name: CACHE BILLS MRN: [id number] Date of Birth: 11-24-1932  Subjective/Objective:                  Met with patient, wife and his two sons regarding discharge planning.  He would like to return home at discharge and home [facility]. He states he has had a bad experience at rehab with [facility] in the past and does not want to ever go there again. He has a bedside commode and front-wheeled walker available for home.  [facility] uses [facility] on [address] for medications. Per Dr. Posey Pronto patient is already on Xarelto.   Action/Plan: List of home health agencies left with patient. Referral to [facility].   Expected Discharge Date:  06/11/17               Expected Discharge Plan:     In-House Referral:     Discharge planning Services  CM Consult  Post Acute Care Choice:  Home Health Choice offered to:  Patient, Spouse, Adult Children  DME Arranged:    DME Agency:     HH Arranged:  PT [place]:  [facility]  Status of Service:  In process, will continue to follow  If discussed at Long Length of Stay Meetings, dates discussed:    Additional Comments:  Marshell Garfinkel, RN 06/08/2017, 3:34 PM

## 2017-06-08 NOTE — Transfer of Care (Signed)
Immediate Anesthesia Transfer of Care Note  Patient: Rulon EisenmengerClarence F Eckersley  Procedure(s) Performed: ARTHROPLASTY BIPOLAR HIP (HEMIARTHROPLASTY) (Left )  Patient Location: PACU  Anesthesia Type:General  Level of Consciousness: sedated  Airway & Oxygen Therapy: Patient Spontanous Breathing and Patient connected to face mask oxygen  Post-op Assessment: Report given to RN and Post -op Vital signs reviewed and stable  Post vital signs: Reviewed and stable  Last Vitals:  Vitals:   06/08/17 0925 06/08/17 1403  BP: 118/84 (!) 110/58  Pulse: 72 75  Resp: 18 20  Temp: (!) 35.8 C (!) 36.2 C  SpO2: 96% 99%    Last Pain:  Vitals:   06/08/17 1403  TempSrc: Temporal  PainSc:          Complications: No apparent anesthesia complications

## 2017-06-08 NOTE — Progress Notes (Signed)
[facility] [place] [facility]   PATIENT NAME: Harold Cervantes    MR#:  [id number]  DATE OF BIRTH:  [birth year]  SUBJECTIVE:  CHIEF COMPLAINT:   Chief Complaint  Patient presents with  . Fall   Came after a fall, Hip fracture- S/p hemiarthroplasty. No complains.  REVIEW OF SYSTEMS:  CONSTITUTIONAL: No fever, fatigue or weakness.  EYES: No blurred or double vision.  EARS, NOSE, AND THROAT: No tinnitus or ear pain.  RESPIRATORY: No cough, shortness of breath, wheezing or hemoptysis.  CARDIOVASCULAR: No chest pain, orthopnea, edema.  GASTROINTESTINAL: No nausea, vomiting, diarrhea or abdominal pain.  GENITOURINARY: No dysuria, hematuria.  ENDOCRINE: No polyuria, nocturia,  HEMATOLOGY: No anemia, easy bruising or bleeding SKIN: No rash or lesion. MUSCULOSKELETAL: left hip pain.   NEUROLOGIC: No tingling, numbness, weakness.  PSYCHIATRY: No anxiety or depression.   ROS  DRUG ALLERGIES:   Allergies  Allergen Reactions  . Pioglitazone Swelling  . Penicillins Rash  . Zolpidem Other (See Comments)    confusion    VITALS:  Blood pressure 108/65, pulse 76, temperature (!) 97.3 F (36.3 C), temperature source Oral, resp. rate 19, height 6' (1.829 m), weight 98.4 kg (217 lb), SpO2 95 %.  PHYSICAL EXAMINATION:  GENERAL:  83 y.o.-year-old patient lying in the bed with no acute distress.  EYES: Pupils equal, round, reactive to light and accommodation. No scleral icterus. Extraocular muscles intact.  HEENT: Head atraumatic, normocephalic. Oropharynx and nasopharynx clear.  NECK:  Supple, no jugular venous distention. No thyroid enlargement, no tenderness.  LUNGS: Normal breath sounds bilaterally, no wheezing, rales,rhonchi or crepitation. No use of accessory muscles of respiration.  CARDIOVASCULAR: S1, S2 normal. No murmurs, rubs, or gallops.  ABDOMEN: Soft, nontender, nondistended. Bowel sounds present. No organomegaly or mass.  EXTREMITIES: No pedal edema,  cyanosis, or clubbing.  NEUROLOGIC: Cranial nerves II through XII are intact. Muscle strength 4/5 in all extremities, except left lower due to pain. Sensation intact. Gait not checked.  PSYCHIATRIC: The patient is alert and oriented x 3.  SKIN: No obvious rash, lesion, or ulcer.   Physical Exam LABORATORY PANEL:   CBC Recent Labs  Lab 06/08/17 0456  WBC 7.3  HGB 13.9  HCT 40.5  PLT 228   ------------------------------------------------------------------------------------------------------------------  Chemistries  Recent Labs  Lab 06/08/17 0456  NA 136  K 3.2*  CL 99*  CO2 29  GLUCOSE 204*  BUN 29*  CREATININE 0.96  CALCIUM 8.1*   ------------------------------------------------------------------------------------------------------------------  Cardiac Enzymes No results for input(s): TROPONINI in the last 168 hours. ------------------------------------------------------------------------------------------------------------------  RADIOLOGY:  Dg Pelvis Portable  Result Date: 06/08/2017 CLINICAL DATA:  Status post left hip hemiarthroplasty. EXAM: PORTABLE PELVIS 1-2 VIEWS COMPARISON:  Intraoperative radiographs from the same day. Preop left hip radiographs. FINDINGS: A left hip hemiarthroplasty is now in place. The femoral component projects over the acetabulum. Pelvis is otherwise intact. Vascular calcifications are noted. IMPRESSION: Left hip hemiarthroplasty without radiographic evidence for complication. Electronically Signed   By: Marin Robertshristopher  Mattern M.D.   On: 06/08/2017 14:43   Dg Chest Portable 1 View  Result Date: 06/08/2017 CLINICAL DATA:  LEFT hip pain after fall. EXAM: PORTABLE CHEST 1 VIEW COMPARISON:  Chest radiograph February 26, 2012 FINDINGS: Cardiac silhouette is moderate to severely enlarged and unchanged. Calcified aortic knob. LEFT cardiac pacemaker single lead tip projects an RIGHT ventricle. Chronic mild hyperinflation. No pleural effusion or focal  consolidation. No pneumothorax. Osteopenia. Soft tissue planes are unremarkable. IMPRESSION: Stable cardiomegaly. Chronic mild hyperinflation.  No acute pulmonary process. Electronically Signed   By: Awilda Metroourtnay  Bloomer M.D.   On: 06/08/2017 06:15   Dg Knee Complete 4 Views Left  Result Date: 06/08/2017 CLINICAL DATA:  Status post fall, with left knee pain. Initial encounter. EXAM: LEFT KNEE - COMPLETE 4+ VIEW COMPARISON:  None. FINDINGS: There is no evidence of fracture or dislocation. The joint spaces are preserved. No significant degenerative change is seen; the patellofemoral joint is grossly unremarkable in appearance. No significant joint effusion is seen. Scattered vascular calcifications are seen. IMPRESSION: 1. No evidence of fracture or dislocation. 2. Scattered vascular calcifications seen. Electronically Signed   By: Roanna RaiderJeffery  Chang M.D.   On: 06/08/2017 06:46   Dg Hip Port Unilat With Pelvis 1v Left  Result Date: 06/08/2017 CLINICAL DATA:  Elective hip surgery EXAM: DG HIP (WITH OR WITHOUT PELVIS) 1V PORT LEFT COMPARISON:  06/08/2017 FINDINGS: Left femoral neck fracture fixation with prosthesis. Left femoral head remains in place. Joint space normal. Limited image quality due to motion. IMPRESSION: Fixation of left femoral neck fracture. Electronically Signed   By: Marlan Palauharles  Clark M.D.   On: 06/08/2017 12:56   Dg Hip Port Unilat With Pelvis 1v Left  Result Date: 06/08/2017 CLINICAL DATA:  Elective surgery EXAM: DG HIP (WITH OR WITHOUT PELVIS) 1V PORT LEFT COMPARISON:  06/08/2017 FINDINGS: Two portable cross-table images demonstrate placement of the femoral neck component of the left hip replacement. Mild degenerative changes in the right hip. IMPRESSION: Placement of the femoral neck component of the left hip replacement. No visible complicating feature. Electronically Signed   By: Charlett NoseKevin  Dover M.D.   On: 06/08/2017 12:55   Dg Hip Unilat W Or Wo Pelvis 2-3 Views Left  Result Date:  06/08/2017 CLINICAL DATA:  LEFT hip pain after fall this morning. EXAM: DG HIP (WITH OR WITHOUT PELVIS) 2-3V LEFT COMPARISON:  None. FINDINGS: Acute LEFT femoral neck fracture with impaction, varus angulation distal bony fragments. No dislocation. No destructive bony lesions. Osteopenia. Aortoiliac calcifications. IMPRESSION: Acute displaced LEFT femoral neck fracture.  No dislocation. Aortic Atherosclerosis (ICD10-I70.0). Electronically Signed   By: Awilda Metroourtnay  Bloomer M.D.   On: 06/08/2017 06:13    ASSESSMENT AND PLAN:   Active Problems:   Hip fracture (HCC)   * left femoral nec fracture   S/p left hip hemiarthroplasty 06/08/17.   PT / OT, manage per Ortho.  * A fib   Rate is controlled with cardizem and Held xarelto due to need for surgery.   Resume xarelto tomorrow.  * hypertension   Currently stable, hold meds.  * DM    Hold oral meds and keep on ISS>   All the records are reviewed and case discussed with Care Management/Social Workerr. Management plans discussed with the patient, family and they are in agreement.  CODE STATUS: full.  TOTAL TIME TAKING CARE OF THIS PATIENT: 35 minutes.     POSSIBLE D/C IN 2 DAYS, DEPENDING ON CLINICAL CONDITION.   Altamese DillingVACHHANI, Alexandra Posadas M.D on 06/08/2017   Between 7am to 6pm - Pager - [phone]  After 6pm go to [email or web address] - Social research officer, governmentpassword EPAS [email or web address]  [facility]  Office  [phone]  CC: Primary care physician; Lauro RegulusAnderson, Marshall W, MD  Note: This dictation was prepared with Dragon dictation along with smaller phrase technology. Any transcriptional errors that result from this process are unintentional.

## 2017-06-08 NOTE — H&P (Signed)
[facility] [place] [facility]   PATIENT NAME: Harold PoagClarence Cervantes    MR#:  [id number]  DATE OF BIRTH:  [birth year]  DATE OF ADMISSION:  06/08/2017  PRIMARY CARE PHYSICIAN: Harold Cervantes, Harold Cervantes   REQUESTING/REFERRING PHYSICIAN:   CHIEF COMPLAINT:   Chief Complaint  Patient presents with  . Fall    HISTORY OF PRESENT ILLNESS: Harold PoagClarence Cervantes  is a 83 y.o. male with a known history of atrial fibrillation, diabetes mellitus, hypertension presented to the emergency room because of fall. Patient called up early this morning and he turned around lost balance and fell on his left hip. Patient has pain which is aching in nature 8 out of 10 on a scale of 1-10 in the left hip. No history of any head injury. No loss of consciousness. No seizure activity. Patient was evaluated with hip x-ray in the emergency room which showed left femoral neck fracture. Hospitalist service was consulted. Orthopedic surgery consultant on call was informed by ER physician. No complaints of any chest pain, shortness of breath.  PAST MEDICAL HISTORY:   Past Medical History:  Diagnosis Date  . A-fib (HCC)   . Diabetes mellitus without complication (HCC)   . Hypertension     PAST SURGICAL HISTORY:  Past Surgical History:  Procedure Laterality Date  . PACEMAKER INSERTION    . TOE AMPUTATION Left   . TOE AMPUTATION Left     SOCIAL HISTORY:  Social History   Tobacco Use  . Smoking status: Former Games developermoker  . Smokeless tobacco: Never Used  Substance Use Topics  . Alcohol use: No    Frequency: Never    FAMILY HISTORY:  Family History  Problem Relation Age of Onset  . Cancer Mother   . Cancer Father   . Cancer Sister     DRUG ALLERGIES:  Allergies  Allergen Reactions  . Pioglitazone Swelling  . Penicillins Rash  . Zolpidem Other (See Comments)    confusion    REVIEW OF SYSTEMS:   CONSTITUTIONAL: No fever, fatigue or weakness.  EYES: No blurred or double vision.   EARS, NOSE, AND THROAT: No tinnitus or ear pain.  RESPIRATORY: No cough, shortness of breath, wheezing or hemoptysis.  CARDIOVASCULAR: No chest pain, orthopnea, edema.  GASTROINTESTINAL: No nausea, vomiting, diarrhea or abdominal pain.  GENITOURINARY: No dysuria, hematuria.  ENDOCRINE: No polyuria, nocturia,  HEMATOLOGY: No anemia, easy bruising or bleeding SKIN: No rash or lesion. MUSCULOSKELETAL: Has left hip pain. NEUROLOGIC: No tingling, numbness, weakness.  PSYCHIATRY: No anxiety or depression.   MEDICATIONS AT HOME:  Prior to Admission medications   Medication Sig Start Date End Date Taking? Authorizing Provider  silodosin (RAPAFLO) 8 MG CAPS capsule Take 1 capsule daily with breakfast by mouth.  10/27/13  Yes Provider, Historical, Cervantes  ACCU-CHEK SOFTCLIX LANCETS lancets U UTD BID 04/01/17   Provider, Historical, Cervantes  atorvastatin (LIPITOR) 40 MG tablet Take 40 mg by mouth daily. 12/04/14   Provider, Historical, Cervantes  carbamazepine (CARBATROL) 200 MG 12 hr capsule 2 (two) times daily. 04/21/16   Provider, Historical, Cervantes  CARTIA XT 180 MG 24 hr capsule TK 1 C PO QD 08/08/16   Provider, Historical, Cervantes  cyanocobalamin (,VITAMIN B-12,) 1000 MCG/ML injection Inject into the muscle. 02/17/17 02/12/18  Provider, Historical, Cervantes  diltiazem (DILACOR XR) 180 MG 24 hr capsule Take 180 mg by mouth. 07/12/12   Provider, Historical, Cervantes  gabapentin (NEURONTIN) 300 MG capsule Take 300 mg by mouth. 07/12/12  Provider, Historical, Cervantes  glipiZIDE (GLUCOTROL) 5 MG tablet  04/24/16   Provider, Historical, Cervantes  magnesium oxide (MAG-OX) 400 MG tablet TAKE 1 TABLET BY MOUTH TWICE DAILY 05/16/16   Provider, Historical, Cervantes  MAGNESIUM-OXIDE 400 (241.3 Mg) MG tablet TK 1 T PO BID 05/12/17   Provider, Historical, Cervantes  metFORMIN (GLUCOPHAGE-XR) 500 MG 24 hr tablet daily. 05/08/16   Provider, Historical, Cervantes  metolazone (ZAROXOLYN) 2.5 MG tablet  05/19/16   Provider, Historical, Cervantes  metoprolol tartrate (LOPRESSOR) 25 MG tablet   04/04/16   Provider, Historical, Cervantes  potassium chloride (K-DUR) 10 MEQ tablet TK 1 T PO QD 08/08/16   Provider, Historical, Cervantes  RAPAFLO 8 MG CAPS capsule TK 1 C PO QD WITH BREAKFAST 06/01/17   Stoioff, Verna Czech, Cervantes  spironolactone (ALDACTONE) 25 MG tablet TK 1 T PO QD 05/15/16   Provider, Historical, Cervantes  torsemide (DEMADEX) 5 MG tablet TK 3 TS PO QD 03/13/16   Provider, Historical, Cervantes  XARELTO 15 MG TABS tablet TK 1 T PO QPM AFTER A MEAL 04/24/16   Provider, Historical, Cervantes      PHYSICAL EXAMINATION:   VITAL SIGNS: Blood pressure 114/71, pulse 74, temperature 97.9 F (36.6 C), temperature source Oral, resp. rate 16, height 6' (1.829 m), weight 99.8 kg (220 lb), SpO2 93 %.  GENERAL:  83 y.o.-year-old patient lying in the bed with no acute distress.  EYES: Pupils equal, round, reactive to light and accommodation. No scleral icterus. Extraocular muscles intact.  HEENT: Head atraumatic, normocephalic. Oropharynx and nasopharynx clear.  NECK:  Supple, no jugular venous distention. No thyroid enlargement, no tenderness.  LUNGS: Normal breath sounds bilaterally, no wheezing, rales,rhonchi or crepitation. No use of accessory muscles of respiration.  CARDIOVASCULAR: S1, S2 normal. No murmurs, rubs, or gallops.  ABDOMEN: Soft, nontender, nondistended. Bowel sounds present. No organomegaly or mass.  EXTREMITIES: No pedal edema, cyanosis, or clubbing.  Left hip tenderness noted. NEUROLOGIC: Cranial nerves II through XII are intact. Muscle strength 5/5 in all extremities. Sensation intact. Gait not checked.  PSYCHIATRIC: The patient is alert and oriented x 3.  SKIN: No obvious rash, lesion, or ulcer.   LABORATORY PANEL:   CBC Recent Labs  Lab 06/08/17 0456  WBC 7.3  HGB 13.9  HCT 40.5  PLT 228  MCV 91.5  MCH 31.5  MCHC 34.5  RDW 14.3   ------------------------------------------------------------------------------------------------------------------  Chemistries  Recent Labs  Lab  06/08/17 0456  NA 136  K 3.2*  CL 99*  CO2 29  GLUCOSE 204*  BUN 29*  CREATININE 0.96  CALCIUM 8.1*   ------------------------------------------------------------------------------------------------------------------ estimated creatinine clearance is 72.6 mL/min (by C-G formula based on SCr of 0.96 mg/dL). ------------------------------------------------------------------------------------------------------------------ No results for input(s): TSH, T4TOTAL, T3FREE, THYROIDAB in the last 72 hours.  Invalid input(s): FREET3   Coagulation profile Recent Labs  Lab 06/08/17 0456  INR 1.45   ------------------------------------------------------------------------------------------------------------------- No results for input(s): DDIMER in the last 72 hours. -------------------------------------------------------------------------------------------------------------------  Cardiac Enzymes No results for input(s): CKMB, TROPONINI, MYOGLOBIN in the last 168 hours.  Invalid input(s): CK ------------------------------------------------------------------------------------------------------------------ Invalid input(s): POCBNP  ---------------------------------------------------------------------------------------------------------------  Urinalysis    Component Value Date/Time   COLORURINE Yellow 05/31/2012 1138   APPEARANCEUR Clear 05/31/2012 1138   LABSPEC 1.014 05/31/2012 1138   PHURINE 6.0 05/31/2012 1138   GLUCOSEU Negative 05/31/2012 1138   HGBUR 2+ 05/31/2012 1138   BILIRUBINUR Negative 05/31/2012 1138   KETONESUR Negative 05/31/2012 1138   PROTEINUR Negative 05/31/2012 1138   NITRITE Negative 05/31/2012 1138  LEUKOCYTESUR Negative 05/31/2012 1138     RADIOLOGY: Dg Chest Portable 1 View  Result Date: 06/08/2017 CLINICAL DATA:  LEFT hip pain after fall. EXAM: PORTABLE CHEST 1 VIEW COMPARISON:  Chest radiograph February 26, 2012 FINDINGS: Cardiac silhouette is  moderate to severely enlarged and unchanged. Calcified aortic knob. LEFT cardiac pacemaker single lead tip projects an RIGHT ventricle. Chronic mild hyperinflation. No pleural effusion or focal consolidation. No pneumothorax. Osteopenia. Soft tissue planes are unremarkable. IMPRESSION: Stable cardiomegaly. Chronic mild hyperinflation.  No acute pulmonary process. Electronically Signed   By: Awilda Metroourtnay  Bloomer M.D.   On: 06/08/2017 06:15   Dg Hip Unilat W Or Wo Pelvis 2-3 Views Left  Result Date: 06/08/2017 CLINICAL DATA:  LEFT hip pain after fall this morning. EXAM: DG HIP (WITH OR WITHOUT PELVIS) 2-3V LEFT COMPARISON:  None. FINDINGS: Acute LEFT femoral neck fracture with impaction, varus angulation distal bony fragments. No dislocation. No destructive bony lesions. Osteopenia. Aortoiliac calcifications. IMPRESSION: Acute displaced LEFT femoral neck fracture.  No dislocation. Aortic Atherosclerosis (ICD10-I70.0). Electronically Signed   By: Awilda Metroourtnay  Bloomer M.D.   On: 06/08/2017 06:13    EKG: Orders placed or performed during the hospital encounter of 06/08/17  . EKG 12-Lead  . EKG 12-Lead  . ED EKG  . ED EKG    IMPRESSION AND PLAN: 83 year old male patient with history of atrial fibrillation, diabetes mellitus, hypertension presented to the emergency room with fall and left hip pain.  Admitting diagnosis 1. Left femoral neck fracture 2. Left hip pain 3. Accidental fall 4. Diabetes mellitus 5. Hypertension Treatment plan Admit patient to medical floor Orthopedic surgery consultation Hold Xarelto Pain management with IV morphine Keep patient nothing by mouth Medical management for diabetes mellitus  All the records are reviewed and case discussed with ED provider. Management plans discussed with the patient, family and they are in agreement.  CODE STATUS:FULL CODE Code Status History    This patient does not have a recorded code status. Please follow your organizational policy  for patients in this situation.       TOTAL TIME TAKING CARE OF THIS PATIENT: 51 minutes.    Ihor AustinPavan Pyreddy M.D on 06/08/2017 at 6:45 AM  Between 7am to 6pm - Pager - [phone]  After 6pm go to [email or web address] - password [email or web address] [facility]  [facility]  Office  [phone]  CC: Primary care physician; Harold Cervantes, Harold Cervantes

## 2017-06-08 NOTE — ED Notes (Addendum)
Pt back from xray; says pain is not any better after being moved in xray

## 2017-06-08 NOTE — ED Triage Notes (Signed)
Pt to the er for pain to the left hip post fall this am. Per ems pt in the floor for 1 hour prior to son arrival and assisted him to the chair. Pt has a hx of poor balance and uses a walker in the am and then switches to a cane as the day progresses.

## 2017-06-08 NOTE — Progress Notes (Signed)
Note reviewed from last pacemaker interrogation per Dr. Lady GaryFath, 05/21/17 Procedure: Pacemaker evaluation   May 21, 2017  Primary MD: Allyn KennerAnderson, Marshall Wilson III, MD Primary Cardiologist: DR. Harold HedgeKENNETH FATH Implanting MD: OTHER  Manufacturer: [employer] Medical Model: [id number] Implant date:   Reason for evaluation:routine Indication for pacemaker: I48.91  Single  Diagnostic Data: Voltage: 2.93 V Estimated Longevity: 8.2 YRS Pacing RA: % RV: 64 % BV: % LV: % Mode switch: % Longest episode:  Ventricular High Rate:  A rate:  V rate:  EGM:   Measurements:  RA: P wave: mV Threshold: V @ ms Lmp: ohms RV: R wave: 3.754mV Threshold: 0.75 V @ 0.824ms Lmp: 330 ohms  LV: Threshold: V @ ms Lmp: ohms  Underlying rhythm: AFIB with V Rate 76  Final Parameters: Mode: VVIR Lower rate: 60 bpm Upper rate: bpm Senor rate: 120 bpm PAV Delay: ms SAV Delay: ms RA: AMP: V PW: ms Sensitivity: mv Polarity:  RV: AMP: 2.5 V PW: 0.4 ms Sensitivity: 2.0 mv Polarity: BI LV: AMP: V PW: ms Sensitivity: mv Polarity:   Changes made:  Conclusions:   [x]  Normal pacemaker function []  Battery at ERI/RRT  Follow up: 6 MONTHS   DONNA LEWIS, CMA  Information transposed from official report completed by Device manufacturer. See scanned document for original documentation.  Electronically Signed by Fortunato CurlingLewis, Donna, CMA on 05/21/2017 2:30 PM EDT   [employer] Clinical Support called, spoke with Thayer Ohmhris, the [phone] does not require further interrogation following surgery.  Reported to Dr. Pernell DupreAdams.  No further actions to be taken.

## 2017-06-08 NOTE — ED Provider Notes (Signed)
[facility] Emergency Department Provider Note   ____________________________________________   First MD Initiated Contact with Patient 06/08/17 [phone]     (approximate)  I have reviewed the triage vital signs and the nursing notes.   HISTORY  Chief Complaint Fall    HPI Harold Cervantes is a 83 y.o. male who comes into the hospital today with a fall.  The patient reports that he fell on his left hip and knee.  He laid something down on a box and then stood up.  The patient turned around to go back to his recliner and lost his balance.  The patient states that he does have some balance issues.  He denies hitting his head and denies any loss of consciousness.  He developed some acute pain in his left hip.  He reports that his pain is currently a 6 out of 10 in intensity.  The patient denies any chest pain, shortness of breath nausea or vomiting.  The patient is on Xarelto.  The patient was concerned because he could not walk so he is here for evaluation.   Past Medical History:  Diagnosis Date  . A-fib (HCC)   . Diabetes mellitus without complication (HCC)   . Hypertension     Patient Active Problem List   Diagnosis Date Noted  . Hip fracture (HCC) 06/08/2017  . Skin ulcer of right calf, limited to breakdown of skin (HCC) 05/18/2017  . Diabetes (HCC) 05/30/2016  . Essential hypertension 05/30/2016  . Hyperlipidemia 05/30/2016  . Swelling of limb 05/30/2016  . Lymphedema 05/30/2016  . PVD (peripheral vascular disease) (HCC) 05/30/2016    Past Surgical History:  Procedure Laterality Date  . PACEMAKER INSERTION    . TOE AMPUTATION Left   . TOE AMPUTATION Left     Prior to Admission medications   Medication Sig Start Date End Date Taking? Authorizing Provider  atorvastatin (LIPITOR) 40 MG tablet Take 40 mg by mouth daily. 12/04/14  Yes [provider]  carbamazepine (CARBATROL) 200 MG 12 hr capsule Take 200 mg 2 (two) times daily by  mouth.  04/21/16  Yes [provider]  CARTIA XT 180 MG 24 hr capsule TK 1 C PO QD 08/08/16  Yes [provider]  gabapentin (NEURONTIN) 300 MG capsule Take 300 mg by mouth. 07/12/12  Yes [provider]  glipiZIDE (GLUCOTROL) 5 MG tablet Take 5 mg 2 (two) times daily before a meal by mouth.  04/24/16  Yes [provider]  magnesium oxide (MAG-OX) 400 MG tablet TAKE 1 TABLET BY MOUTH TWICE DAILY 05/16/16  Yes [provider]  metFORMIN (GLUCOPHAGE-XR) 500 MG 24 hr tablet daily. 05/08/16  Yes [provider]  metolazone (ZAROXOLYN) 2.5 MG tablet Take 2.5 mg daily by mouth.  05/19/16  Yes [provider]  metoprolol tartrate (LOPRESSOR) 25 MG tablet Take 25 mg 2 (two) times daily by mouth.  04/04/16  Yes [provider]  potassium chloride (K-DUR) 10 MEQ tablet TK 1 T PO QD 08/08/16  Yes [provider]  RAPAFLO 8 MG CAPS capsule TK 1 C PO QD WITH BREAKFAST 06/01/17  Yes Stoioff, Scott C, MD  silodosin (RAPAFLO) 8 MG CAPS capsule Take 1 capsule daily with breakfast by mouth.  10/27/13  Yes [provider]  spironolactone (ALDACTONE) 25 MG tablet 12.5 mg by mouth daily 05/15/16  Yes [provider]  torsemide (DEMADEX) 5 MG tablet TK 3 TS PO QD 03/13/16  Yes [provider]  XARELTO 15 MG TABS tablet TK 1 T PO QPM AFTER A MEAL 04/24/16  Yes [provider]  ACCU-CHEK SOFTCLIX LANCETS lancets U UTD BID 04/01/17   [provider]  cyanocobalamin (,VITAMIN B-12,) 1000 MCG/ML injection Inject into the muscle. 02/17/17 02/12/18  [provider]  diltiazem (DILACOR XR) 180 MG 24 hr capsule Take 180 mg by mouth. 07/12/12   [provider]  MAGNESIUM-OXIDE 400 (241.3 Mg) MG tablet TK 1 T PO BID 05/12/17   [provider]    Allergies Pioglitazone; Penicillins; and Zolpidem  Family History  Problem Relation Age of Onset  . Cancer Mother   . Cancer Father   . Cancer  Sister     Social History Social History   Tobacco Use  . Smoking status: Former Games developermoker  . Smokeless tobacco: Never Used  Substance Use Topics  . Alcohol use: No    Frequency: Never  . Drug use: No    Review of Systems  Constitutional: No fever/chills Eyes: No visual changes. ENT: No sore throat. Cardiovascular: Denies chest pain. Respiratory: Denies shortness of breath. Gastrointestinal: No abdominal pain.  No nausea, no vomiting.  No diarrhea.  No constipation. Genitourinary: Negative for dysuria. Musculoskeletal: Left hip pain Skin: Negative for rash. Neurological: Negative for headaches, focal weakness or numbness.   ____________________________________________   PHYSICAL EXAM:  VITAL SIGNS: ED Triage Vitals  Enc Vitals Group     BP 06/08/17 0439 (!) 127/92     Pulse Rate 06/08/17 0439 67     Resp 06/08/17 0439 16     Temp 06/08/17 0439 97.9 F (36.6 C)     Temp Source 06/08/17 0439 Oral     SpO2 06/08/17 0439 98 %     Weight 06/08/17 0434 220 lb (99.8 kg)     Height 06/08/17 0434 6' (1.829 m)     Head Circumference --      Peak Flow --      Pain Score 06/08/17 0507 7     Pain Loc --      Pain Edu? --      Excl. in GC? --     Constitutional: Alert and oriented. Well appearing and in moderate distress. Eyes: Conjunctivae are normal. PERRL. EOMI. Head: Atraumatic. Nose: No congestion/rhinnorhea. Mouth/Throat: Mucous membranes are moist.  Oropharynx non-erythematous. Cardiovascular: Normal rate, regular rhythm. Grossly normal heart sounds.  Good peripheral circulation. Respiratory: Normal respiratory effort.  No retractions. Lungs CTAB. Gastrointestinal: Soft and nontender. No distention.  Positive bowel sounds Musculoskeletal: Left leg is shortened and externally rotated with some tenderness to palpation of the left hip Neurologic:  Normal speech and language. No gross focal neurologic deficits are appreciated. No gait instability. Skin:  Skin is  warm, dry and intact.  Psychiatric: Mood and affect are normal.   ____________________________________________   LABS (all labs ordered are listed, but only abnormal results are displayed)  Labs Reviewed  BASIC METABOLIC PANEL - Abnormal; Notable for the following components:      Result Value   Potassium 3.2 (*)    Chloride 99 (*)    Glucose, Bld 204 (*)    BUN 29 (*)    Calcium 8.1 (*)    All other components within normal limits  PROTIME-INR - Abnormal; Notable for the following components:   Prothrombin Time 17.5 (*)    All other components within normal limits  CBC  TYPE AND SCREEN   ____________________________________________  EKG  ED ECG REPORT I, Kruz Chiu,  Melchor AmourAllison P, the attending physician, personally viewed and interpreted this ECG.   Date: 06/08/2017  EKG Time: 443  Rate: 74  Rhythm: normal sinus rhythm, ventricular paced complexes  Axis: normal  Intervals:none  ST&T Change: flipped t waves in lead II, III, aVF, V4, V5 and V6  ____________________________________________  RADIOLOGY  Dg Chest Portable 1 View  Result Date: 06/08/2017 CLINICAL DATA:  LEFT hip pain after fall. EXAM: PORTABLE CHEST 1 VIEW COMPARISON:  Chest radiograph February 26, 2012 FINDINGS: Cardiac silhouette is moderate to severely enlarged and unchanged. Calcified aortic knob. LEFT cardiac pacemaker single lead tip projects an RIGHT ventricle. Chronic mild hyperinflation. No pleural effusion or focal consolidation. No pneumothorax. Osteopenia. Soft tissue planes are unremarkable. IMPRESSION: Stable cardiomegaly. Chronic mild hyperinflation.  No acute pulmonary process. Electronically Signed   By: Awilda Metroourtnay  Bloomer M.D.   On: 06/08/2017 06:15   Dg Knee Complete 4 Views Left  Result Date: 06/08/2017 CLINICAL DATA:  Status post fall, with left knee pain. Initial encounter. EXAM: LEFT KNEE - COMPLETE 4+ VIEW COMPARISON:  None. FINDINGS: There is no evidence of fracture or dislocation. The  joint spaces are preserved. No significant degenerative change is seen; the patellofemoral joint is grossly unremarkable in appearance. No significant joint effusion is seen. Scattered vascular calcifications are seen. IMPRESSION: 1. No evidence of fracture or dislocation. 2. Scattered vascular calcifications seen. Electronically Signed   By: Roanna RaiderJeffery  Chang M.D.   On: 06/08/2017 06:46   Dg Hip Unilat W Or Wo Pelvis 2-3 Views Left  Result Date: 06/08/2017 CLINICAL DATA:  LEFT hip pain after fall this morning. EXAM: DG HIP (WITH OR WITHOUT PELVIS) 2-3V LEFT COMPARISON:  None. FINDINGS: Acute LEFT femoral neck fracture with impaction, varus angulation distal bony fragments. No dislocation. No destructive bony lesions. Osteopenia. Aortoiliac calcifications. IMPRESSION: Acute displaced LEFT femoral neck fracture.  No dislocation. Aortic Atherosclerosis (ICD10-I70.0). Electronically Signed   By: Awilda Metroourtnay  Bloomer M.D.   On: 06/08/2017 06:13    ____________________________________________   PROCEDURES  Procedure(s) performed: None  Procedures  Critical Care performed: No  ____________________________________________   INITIAL IMPRESSION / ASSESSMENT AND PLAN / ED COURSE  As part of my medical decision making, I reviewed the following data within the electronic MEDICAL RECORD NUMBER Notes from prior ED visits and  Controlled Substance Database   This is an 83 year old male who comes into the hospital today after a fall.  The patient has some hip pain so my differential diagnosis includes musculoskeletal pain versus hip fracture.  I did send the patient for an x-ray of his left hip as well as a chest x-ray.  Clinically it appears that the patient has a hip fracture.     Results of the patient's x-ray and it appears that he does have a left hip fracture.  I did contact Dr. Allena KatzPatel the surgeon and I did admit the patient to the hospitalist service.  The patient received a second dose of morphine  for his pain.  He will be admitted for hip replacement. ____________________________________________   FINAL CLINICAL IMPRESSION(S) / ED DIAGNOSES  Final diagnoses:  Closed fracture of left hip, initial encounter [facility](HCC)     ED Discharge Orders    None       Note:  This document was prepared using Dragon voice recognition software and may include unintentional dictation errors.    Rebecka ApleyWebster, Donia Yokum P, MD 06/08/17 [phone]

## 2017-06-08 NOTE — [place] FL2 (Signed)
[place] MEDICAID FL2 LEVEL OF CARE SCREENING TOOL     IDENTIFICATION  Patient Name: Harold Cervantes Birthdate: 11/09/1932 Sex: male Admission Date (Current Location): 06/08/2017  [place]   Facility and Address:  [facility], [address], [place], [place] [address]      Provider Number: [id number]  Attending Physician Name and Address:  Altamese DillingVachhani, Vaibhavkumar, *  Relative Name and Phone Number:       Current Level of Care: Hospital Recommended Level of Care: Skilled Nursing Facility Prior Approval Number:    Date Approved/Denied:   PASRR Number: ([id number] )  Discharge Plan: SNF    Current Diagnoses: Patient Active Problem List   Diagnosis Date Noted  . Hip fracture (HCC) 06/08/2017  . Skin ulcer of right calf, limited to breakdown of skin (HCC) 05/18/2017  . Diabetes (HCC) 05/30/2016  . Essential hypertension 05/30/2016  . Hyperlipidemia 05/30/2016  . Swelling of limb 05/30/2016  . Lymphedema 05/30/2016  . PVD (peripheral vascular disease) (HCC) 05/30/2016    Orientation RESPIRATION BLADDER Height & Weight     Self, Time, Situation, Place  Normal Continent Weight: 217 lb (98.4 kg) Height:  6' (182.9 cm)  BEHAVIORAL SYMPTOMS/MOOD NEUROLOGICAL BOWEL NUTRITION STATUS      Continent Diet(NPO for surgery )  AMBULATORY STATUS COMMUNICATION OF NEEDS Skin   Extensive Assist Verbally Surgical wounds(Incision: Left Hip. )                       Personal Care Assistance Level of Assistance  Bathing, Feeding, Dressing Bathing Assistance: Limited assistance Feeding assistance: Independent Dressing Assistance: Limited assistance     Functional Limitations Info  Sight, Hearing, Speech Sight Info: Adequate Hearing Info: Adequate Speech Info: Adequate    SPECIAL CARE FACTORS FREQUENCY  PT (By licensed PT), OT (By licensed OT)     PT Frequency: (5) OT Frequency: (5)             Contractures      Additional Factors Info  Code Status, Allergies Code Status Info: (Full Code. ) Allergies Info: (Pioglitazone, Penicillins, Zolpidem)           Current Medications (06/08/2017):  This is the current hospital active medication list Current Facility-Administered Medications  Medication Dose Route Frequency Provider Last Rate Last Dose  . acetaminophen (TYLENOL) tablet 650 mg  650 mg Oral Q6H PRN Ihor AustinPyreddy, Pavan, MD       Or  . acetaminophen (TYLENOL) suppository 650 mg  650 mg Rectal Q6H PRN Pyreddy, Vivien RotaPavan, MD      . atorvastatin (LIPITOR) tablet 40 mg  40 mg Oral Daily Pyreddy, Pavan, MD      . carbamazepine (TEGRETOL XR) 12 hr tablet 200 mg  200 mg Oral BID Pyreddy, Pavan, MD      . dextrose 5 %-0.9 % sodium chloride infusion   Intravenous Continuous Ihor AustinPyreddy, Pavan, MD 75 mL/hr at 06/08/17 [id number]    . diltiazem (CARDIZEM CD) 24 hr capsule 180 mg  180 mg Oral Daily Pyreddy, Pavan, MD      . gabapentin (NEURONTIN) capsule 300 mg  300 mg Oral BID Pyreddy, Vivien RotaPavan, MD      . HYDROcodone-acetaminophen (NORCO/VICODIN) 5-325 MG per tablet 1-2 tablet  1-2 tablet Oral Q4H PRN Pyreddy, Pavan, MD      . insulin aspart (novoLOG) injection 0-15 Units  0-15 Units Subcutaneous TID WC Ihor AustinPyreddy, Pavan, MD   3 Units at 06/08/17 [id number]  .  insulin aspart (novoLOG) injection 0-5 Units  0-5 Units Subcutaneous QHS Pyreddy, Pavan, MD      . magnesium oxide (MAG-OX) tablet 400 mg  400 mg Oral BID Pyreddy, Pavan, MD      . morphine 2 MG/ML injection 2 mg  2 mg Intravenous Q4H PRN Ihor AustinPyreddy, Pavan, MD   2 mg at 06/08/17 0730  . ondansetron (ZOFRAN) tablet 4 mg  4 mg Oral Q6H PRN Pyreddy, Vivien RotaPavan, MD       Or  . ondansetron (ZOFRAN) injection 4 mg  4 mg Intravenous Q6H PRN Pyreddy, Pavan, MD      . potassium chloride SA (K-DUR,KLOR-CON) CR tablet 40 mEq  40 mEq Oral Once Ihor AustinPyreddy, Pavan, MD   Stopped at 06/08/17 [id number]  . senna-docusate (Senokot-S) tablet 1 tablet  1 tablet Oral QHS PRN Ihor AustinPyreddy, Pavan,  MD         Discharge Medications: Please see discharge summary for a list of discharge medications.  Relevant Imaging Results:  Relevant Lab Results:   Additional Information (SSN: [id number])  Byrl Latin, Darleen CrockerBailey M, LCSW

## 2017-06-08 NOTE — Progress Notes (Signed)
Pt was calling out from room repeatedly asking for help, so was moved to room 144 closer to nurses station. He is forgetful saying that he forgot he had already had surgery. Pt was oriented to new room and educated to use call bell. Pt rates pain 4/10 at current time. VSS.   [place], Latricia HeftKorie G

## 2017-06-08 NOTE — Progress Notes (Signed)
Patient was admitted to room 141 from ED. A&O x 4. Una boot in place to right lower leg. Reviewed POC. CHG bath given. MRSA screen taken. IV fluids started. BS 193. VSS. Bed alarm on for safety.

## 2017-06-09 LAB — URINALYSIS, COMPLETE (UACMP) WITH MICROSCOPIC
Bacteria, UA: NONE SEEN
Bilirubin Urine: NEGATIVE
GLUCOSE, UA: NEGATIVE mg/dL
KETONES UR: NEGATIVE mg/dL
LEUKOCYTES UA: NEGATIVE
Nitrite: NEGATIVE
PROTEIN: NEGATIVE mg/dL
Specific Gravity, Urine: 1.019 (ref 1.005–1.030)
pH: 5 (ref 5.0–8.0)

## 2017-06-09 LAB — GLUCOSE, CAPILLARY
GLUCOSE-CAPILLARY: 188 mg/dL — AB (ref 65–99)
GLUCOSE-CAPILLARY: 182 mg/dL — AB (ref 65–99)
Glucose-Capillary: 188 mg/dL — ABNORMAL HIGH (ref 65–99)
Glucose-Capillary: 205 mg/dL — ABNORMAL HIGH (ref 65–99)
Glucose-Capillary: 158 mg/dL — ABNORMAL HIGH (ref 65–99)

## 2017-06-09 LAB — CBC
HCT: 32.4 % — ABNORMAL LOW (ref 40.0–52.0)
Hemoglobin: 11.4 g/dL — ABNORMAL LOW (ref 13.0–18.0)
MCH: 32.1 pg (ref 26.0–34.0)
MCHC: 35.1 g/dL (ref 32.0–36.0)
MCV: 91.5 fL (ref 80.0–100.0)
PLATELETS: 181 10*3/uL (ref 150–440)
RBC: 3.55 MIL/uL — ABNORMAL LOW (ref 4.40–5.90)
RDW: 14.3 % (ref 11.5–14.5)
WBC: 8.5 10*3/uL (ref 3.8–10.6)

## 2017-06-09 LAB — BASIC METABOLIC PANEL
Anion gap: 7 (ref 5–15)
BUN: 22 mg/dL — AB (ref 6–20)
CO2: 28 mmol/L (ref 22–32)
CREATININE: 1.26 mg/dL — AB (ref 0.61–1.24)
Calcium: 7.5 mg/dL — ABNORMAL LOW (ref 8.9–10.3)
Chloride: 101 mmol/L (ref 101–111)
GFR calc Af Amer: 60 mL/min — ABNORMAL LOW (ref >60)
GFR, EST NON AFRICAN AMERICAN: 51 mL/min — AB (ref >60)
GLUCOSE: 177 mg/dL — AB (ref 65–99)
Potassium: 3.3 mmol/L — ABNORMAL LOW (ref 3.5–5.1)
SODIUM: 136 mmol/L (ref 135–145)

## 2017-06-09 MED ORDER — POTASSIUM CHLORIDE IN NACL 40-0.9 MEQ/L-% IV SOLN
INTRAVENOUS | Status: DC
  Administered 2017-06-09 – 2017-06-11 (×3): 75 mL/h via INTRAVENOUS
  Filled 2017-06-09 (×7): qty 1000

## 2017-06-09 MED ORDER — RIVAROXABAN 15 MG PO TABS
15.0000 mg | ORAL_TABLET | Freq: Every day | ORAL | Status: DC
  Administered 2017-06-09 – 2017-06-10 (×2): 15 mg via ORAL
  Filled 2017-06-09 (×3): qty 1

## 2017-06-09 MED ORDER — POTASSIUM CHLORIDE CRYS ER 20 MEQ PO TBCR
20.0000 meq | EXTENDED_RELEASE_TABLET | Freq: Two times a day (BID) | ORAL | Status: DC
  Administered 2017-06-09 – 2017-06-11 (×5): 20 meq via ORAL
  Filled 2017-06-09 (×4): qty 1

## 2017-06-09 MED ORDER — TAMSULOSIN HCL 0.4 MG PO CAPS
0.4000 mg | ORAL_CAPSULE | Freq: Every day | ORAL | Status: DC
  Administered 2017-06-09 – 2017-06-11 (×3): 0.4 mg via ORAL
  Filled 2017-06-09 (×3): qty 1

## 2017-06-09 NOTE — Clinical Social Work Placement (Signed)
   CLINICAL SOCIAL WORK PLACEMENT  NOTE  Date:  06/09/2017  Patient Details  Name: Rulon EisenmengerClarence F Vansickle MRN: [id number] Date of Birth: 08-06-1932  Clinical Social Work is seeking post-discharge placement for this patient at the Skilled  Nursing Facility level of care (*CSW will initial, date and re-position this form in  chart as items are completed):  Yes   Patient/family provided with [facility] Clinical Social Work Department's list of facilities offering this level of care within the geographic area requested by the patient (or if unable, by the patient's family).  Yes   Patient/family informed of their freedom to choose among providers that offer the needed level of care, that participate in Medicare, Medicaid or managed care program needed by the patient, have an available bed and are willing to accept the patient.  Yes   Patient/family informed of [facility]'s ownership interest in [facility] and [facility], as well as of the fact that they are under no obligation to receive care at these facilities.  PASRR submitted to EDS on       PASRR number received on       Existing PASRR number confirmed on 06/09/17     FL2 transmitted to all facilities in geographic area requested by pt/family on 06/09/17     FL2 transmitted to all facilities within larger geographic area on       Patient informed that his/her managed care company has contracts with or will negotiate with certain facilities, including the following:        Yes   Patient/family informed of bed offers received.  Patient chooses bed at [facility] )     Physician recommends and patient chooses bed at      Patient to be transferred to   on  .  Patient to be transferred to facility by       Patient family notified on   of transfer.  Name of family member notified:        PHYSICIAN       Additional Comment:    _______________________________________________ Naria Abbey, Darleen CrockerBailey M, LCSW 06/09/2017,  9:51 AM

## 2017-06-09 NOTE — Evaluation (Signed)
Physical Therapy Evaluation Patient Details Name: Harold Cervantes MRN: [id number] DOB: Feb 02, 1933 Today's Date: 06/09/2017   History of Present Illness  83 y.o. male s/p fall, L hip fracture and subsequent Hemiarthroplasty (posterior approach) 06/08/17. Pt. PMHx includes: DM, HTN, Lympedema, Pacemaker Insertion, and Hx of L great toe amputation.   Clinical Impression  Pt showed good effort and motivation with PT exam, but ultimately was functionally limited due to strength, pain and generally feeling poorly post-op.  He was able to make and effort with bed mobility but ultimately needed considerable assist to get to EOB and standing, as well as with limited ambulation.  He also showed good effort with ~12 minutes of exercises apart from the exam, but has functional weakness in all L hip related activities.  Pt wanting to go home, will need to make significant improvements in the next few PT sessions for this to be safe, recommending STR at this time.     Follow Up Recommendations SNF(pt wanting to go home with HHPT if possible/safe)    Equipment Recommendations       Recommendations for Other Services       Precautions / Restrictions Precautions Precautions: Posterior Hip Restrictions Weight Bearing Restrictions: Yes LLE Weight Bearing: Weight bearing as tolerated      Mobility  Bed Mobility Overal bed mobility: Needs Assistance Bed Mobility: Supine to Sit     Supine to sit: Mod assist     General bed mobility comments: Pt made good effort with getting to EOB, but ultimately needed assist to get to EOB and to get torso upright and to sitting  Transfers Overall transfer level: Needs assistance Equipment used: Rolling walker (2 wheeled) Transfers: Sit to/from Stand Sit to Stand: Min assist;Mod assist         General transfer comment: Pt showed good effort in trying to get to standing, but even with elevated surface he did need direct assist to get to  standing  Ambulation/Gait Ambulation/Gait assistance: Min assist Ambulation Distance (Feet): 7 Feet Assistive device: Rolling walker (2 wheeled)       General Gait Details: Pt very hesitant to put much weight on the L LE and heavily reliant on the walker, but ultimately was able to take multiple small, hesitant steps with constant cuing/encouragement/light assist.  He was very fatigued with the minimal walking effort, but did not have any overt LOBs and showed good motivation despite pain  Stairs            Wheelchair Mobility    Modified Rankin (Stroke Patients Only)       Balance Overall balance assessment: Needs assistance Sitting-balance support: Bilateral upper extremity supported Sitting balance-Leahy Scale: Good     Standing balance support: Bilateral upper extremity supported Standing balance-Leahy Scale: Fair Standing balance comment: static standing balance with walker good, decreased signficantly with dynamic/functional activity                             Pertinent Vitals/Pain Pain Assessment: No/denies pain(c/o pain up to 6/10 during activity) Pain Score: 5  Pain Descriptors / Indicators: Aching Pain Intervention(s): Limited activity within patient's tolerance;Monitored during session    Home Living Family/patient expects to be discharged to:: Skilled nursing facility(they want to go home if possible) Living Arrangements: Spouse/significant other Available Help at Discharge: Family Type of Home: House Home Access: Stairs to enter Entrance Stairs-Rails: (has vertically anchored grab bar on R) Entrance Stairs-Number of Steps: 2  Home Layout: One level Home Equipment: Emergency planning/management officer - 2 wheels;Cane - single point      Prior Function Level of Independence: Needs assistance   Gait / Transfers Assistance Needed: Pt reports he does not do a lot of walking outside the house, has had minimal actual falls but does have balance issues  ADL's  / Homemaking Assistance Needed: Pt. was indepndent with basic ADL tasks, required assist from his wife for IADL tasks. used a cane in the household, reports having a walker. Uses the grocery store scooters during shopping trips. Has stopped driving.        Hand Dominance        Extremity/Trunk Assessment   Upper Extremity Assessment Upper Extremity Assessment: Overall WFL for tasks assessed(general age appropriate weakness)    Lower Extremity Assessment Lower Extremity Assessment: Generalized weakness(expected L LE post-op weakness, R LE grossly 3+/5)       Communication   Communication: No difficulties  Cognition Arousal/Alertness: Awake/alert Behavior During Therapy: WFL for tasks assessed/performed Overall Cognitive Status: Within Functional Limits for tasks assessed                                        General Comments      Exercises Total Joint Exercises Ankle Circles/Pumps: Strengthening;10 reps Quad Sets: Strengthening;10 reps Gluteal Sets: Strengthening;10 reps Short Arc Quad: AROM;10 reps Heel Slides: Strengthening;AROM;10 reps Hip ABduction/ADduction: AROM;10 reps   Assessment/Plan    PT Assessment Patient needs continued PT services  PT Problem List         PT Treatment Interventions DME instruction;Gait training;Stair training;Therapeutic activities;Therapeutic exercise;Balance training    PT Goals (Current goals can be found in the Care Plan section)  Acute Rehab PT Goals Patient Stated Goal: go home PT Goal Formulation: With patient Time For Goal Achievement: 06/23/17 Potential to Achieve Goals: Fair    Frequency BID   Barriers to discharge        Co-evaluation               AM-PAC PT "6 Clicks" Daily Activity  Outcome Measure Difficulty turning over in bed (including adjusting bedclothes, sheets and blankets)?: Unable Difficulty moving from lying on back to sitting on the side of the bed? : Unable Difficulty  sitting down on and standing up from a chair with arms (e.g., wheelchair, bedside commode, etc,.)?: Unable Help needed moving to and from a bed to chair (including a wheelchair)?: A Lot Help needed walking in hospital room?: A Lot Help needed climbing 3-5 steps with a railing? : Total 6 Click Score: 8    End of Session Equipment Utilized During Treatment: Gait belt Activity Tolerance: Patient tolerated treatment well;Patient limited by fatigue Patient left: with chair alarm set;with call bell/phone within reach;with family/visitor present Nurse Communication: Mobility status PT Visit Diagnosis: Muscle weakness (generalized) (M62.81);Difficulty in walking, not elsewhere classified (R26.2);Unsteadiness on feet (R26.81)    Time: 4098-1191 PT Time Calculation (min) (ACUTE ONLY): 44 min   Charges:   PT Evaluation $PT Eval Low Complexity: 1 Low PT Treatments $Therapeutic Exercise: 8-22 mins $Therapeutic Activity: 8-22 mins   PT G Codes:   PT G-Codes **NOT FOR INPATIENT CLASS** Functional Assessment Tool Used: AM-PAC 6 Clicks Basic Mobility Functional Limitation: Mobility: Walking and moving around Mobility: Walking and Moving Around Current Status (Y7829): At least 80 percent but less than 100 percent impaired, limited or restricted Mobility: Walking  and Moving Around Goal Status [phone](G8979): At least 40 percent but less than 60 percent impaired, limited or restricted    Malachi ProGalen R Anaijah Augsburger, DPT 06/09/2017, 10:24 AM

## 2017-06-09 NOTE — Progress Notes (Signed)
[facility]   PATIENT NAME: Harold Cervantes    MR#:  [id number]  DATE OF BIRTH:  1932/10/08  SUBJECTIVE:  CHIEF COMPLAINT:   Chief Complaint  Patient presents with  . Fall   Came after a fall, Hip fracture- S/p hemiarthroplasty. No complains.   Had chronic bladder related issues.  no complains today.  REVIEW OF SYSTEMS:  CONSTITUTIONAL: No fever, fatigue or weakness.  EYES: No blurred or double vision.  EARS, NOSE, AND THROAT: No tinnitus or ear pain.  RESPIRATORY: No cough, shortness of breath, wheezing or hemoptysis.  CARDIOVASCULAR: No chest pain, orthopnea, edema.  GASTROINTESTINAL: No nausea, vomiting, diarrhea or abdominal pain.  GENITOURINARY: No dysuria, hematuria.  ENDOCRINE: No polyuria, nocturia,  HEMATOLOGY: No anemia, easy bruising or bleeding SKIN: No rash or lesion. MUSCULOSKELETAL: left hip pain.   NEUROLOGIC: No tingling, numbness, weakness.  PSYCHIATRY: No anxiety or depression.   ROS  DRUG ALLERGIES:   Allergies  Allergen Reactions  . Pioglitazone Swelling  . Penicillins Rash  . Zolpidem Other (See Comments)    confusion    VITALS:  Blood pressure (!) 97/59, pulse 63, temperature 97.7 F (36.5 C), temperature source Oral, resp. rate 16, height 6' (1.829 m), weight 98.4 kg (217 lb), SpO2 95 %.  PHYSICAL EXAMINATION:  GENERAL:  83 y.o.-year-old patient lying in the bed with no acute distress.  EYES: Pupils equal, round, reactive to light and accommodation. No scleral icterus. Extraocular muscles intact.  HEENT: Head atraumatic, normocephalic. Oropharynx and nasopharynx clear.  NECK:  Supple, no jugular venous distention. No thyroid enlargement, no tenderness.  LUNGS: Normal breath sounds bilaterally, no wheezing, rales,rhonchi or crepitation. No use of accessory muscles of respiration.  CARDIOVASCULAR: S1, S2 normal. No murmurs, rubs, or gallops.  ABDOMEN: Soft, nontender, nondistended. Bowel sounds  present. No organomegaly or mass.  EXTREMITIES: No pedal edema, cyanosis, or clubbing.  NEUROLOGIC: Cranial nerves II through XII are intact. Muscle strength 4/5 in all extremities, except left lower due to pain. Sensation intact. Gait not checked.  PSYCHIATRIC: The patient is alert and oriented x 3.  SKIN: No obvious rash, lesion, or ulcer.   Physical Exam LABORATORY PANEL:   CBC Recent Labs  Lab 06/09/17 0347  WBC 8.5  HGB 11.4*  HCT 32.4*  PLT 181   ------------------------------------------------------------------------------------------------------------------  Chemistries  Recent Labs  Lab 06/09/17 0347  NA 136  K 3.3*  CL 101  CO2 28  GLUCOSE 177*  BUN 22*  CREATININE 1.26*  CALCIUM 7.5*   ------------------------------------------------------------------------------------------------------------------  Cardiac Enzymes No results for input(s): TROPONINI in the last 168 hours. ------------------------------------------------------------------------------------------------------------------  RADIOLOGY:  Dg Pelvis Portable  Result Date: 06/08/2017 CLINICAL DATA:  Status post left hip hemiarthroplasty. EXAM: PORTABLE PELVIS 1-2 VIEWS COMPARISON:  Intraoperative radiographs from the same day. Preop left hip radiographs. FINDINGS: A left hip hemiarthroplasty is now in place. The femoral component projects over the acetabulum. Pelvis is otherwise intact. Vascular calcifications are noted. IMPRESSION: Left hip hemiarthroplasty without radiographic evidence for complication. Electronically Signed   By: Marin Robertshristopher  Mattern M.D.   On: 06/08/2017 14:43   Dg Chest Portable 1 View  Result Date: 06/08/2017 CLINICAL DATA:  LEFT hip pain after fall. EXAM: PORTABLE CHEST 1 VIEW COMPARISON:  Chest radiograph February 26, 2012 FINDINGS: Cardiac silhouette is moderate to severely enlarged and unchanged. Calcified aortic knob. LEFT cardiac pacemaker single lead tip projects an RIGHT  ventricle. Chronic mild hyperinflation. No pleural effusion or focal consolidation. No pneumothorax. Osteopenia.  Soft tissue planes are unremarkable. IMPRESSION: Stable cardiomegaly. Chronic mild hyperinflation.  No acute pulmonary process. Electronically Signed   By: Awilda Metroourtnay  Bloomer M.D.   On: 06/08/2017 06:15   Dg Knee Complete 4 Views Left  Result Date: 06/08/2017 CLINICAL DATA:  Status post fall, with left knee pain. Initial encounter. EXAM: LEFT KNEE - COMPLETE 4+ VIEW COMPARISON:  None. FINDINGS: There is no evidence of fracture or dislocation. The joint spaces are preserved. No significant degenerative change is seen; the patellofemoral joint is grossly unremarkable in appearance. No significant joint effusion is seen. Scattered vascular calcifications are seen. IMPRESSION: 1. No evidence of fracture or dislocation. 2. Scattered vascular calcifications seen. Electronically Signed   By: Roanna RaiderJeffery  Chang M.D.   On: 06/08/2017 06:46   Dg Hip Port Unilat With Pelvis 1v Left  Result Date: 06/08/2017 CLINICAL DATA:  Elective hip surgery EXAM: DG HIP (WITH OR WITHOUT PELVIS) 1V PORT LEFT COMPARISON:  06/08/2017 FINDINGS: Left femoral neck fracture fixation with prosthesis. Left femoral head remains in place. Joint space normal. Limited image quality due to motion. IMPRESSION: Fixation of left femoral neck fracture. Electronically Signed   By: Marlan Palauharles  Clark M.D.   On: 06/08/2017 12:56   Dg Hip Port Unilat With Pelvis 1v Left  Result Date: 06/08/2017 CLINICAL DATA:  Elective surgery EXAM: DG HIP (WITH OR WITHOUT PELVIS) 1V PORT LEFT COMPARISON:  06/08/2017 FINDINGS: Two portable cross-table images demonstrate placement of the femoral neck component of the left hip replacement. Mild degenerative changes in the right hip. IMPRESSION: Placement of the femoral neck component of the left hip replacement. No visible complicating feature. Electronically Signed   By: Charlett NoseKevin  Dover M.D.   On: 06/08/2017 12:55    Dg Hip Unilat W Or Wo Pelvis 2-3 Views Left  Result Date: 06/08/2017 CLINICAL DATA:  LEFT hip pain after fall this morning. EXAM: DG HIP (WITH OR WITHOUT PELVIS) 2-3V LEFT COMPARISON:  None. FINDINGS: Acute LEFT femoral neck fracture with impaction, varus angulation distal bony fragments. No dislocation. No destructive bony lesions. Osteopenia. Aortoiliac calcifications. IMPRESSION: Acute displaced LEFT femoral neck fracture.  No dislocation. Aortic Atherosclerosis (ICD10-I70.0). Electronically Signed   By: Awilda Metroourtnay  Bloomer M.D.   On: 06/08/2017 06:13    ASSESSMENT AND PLAN:   Active Problems:   Hip fracture (HCC)   * left femoral neck fracture   S/p left hip hemiarthroplasty 06/08/17.   PT / OT, manage per Ortho.   May need SNF on d/c,  * A fib   Rate is controlled with cardizem and Held xarelto due to need for surgery.   Resume xarelto today.  * hypertension   Currently stable, hold meds.  * DM    Hold oral meds and keep on ISS>  * Urinary retention   Start on flomax, Check UA.   May need In - out foley,if > 400 retention amount.  * hypokalemia    Replace.  All the records are reviewed and case discussed with Care Management/Social Workerr. Management plans discussed with the patient, family and they are in agreement.  CODE STATUS: full.  TOTAL TIME TAKING CARE OF THIS PATIENT: 35 minutes.   POSSIBLE D/C IN 2 DAYS, DEPENDING ON CLINICAL CONDITION.   Altamese DillingVACHHANI, Anahi Belmar M.D on 06/09/2017   Between 7am to 6pm - Pager - [phone]  After 6pm go to [email or web address] - Social research officer, governmentpassword [email or web address] [email or web address]  [facility]  Office  [phone]  CC: Primary care physician; Lauro RegulusAnderson, Marshall W, MD  Note: This dictation was  prepared with Dragon dictation along with smaller phrase technology. Any transcriptional errors that result from this process are unintentional.

## 2017-06-09 NOTE — Clinical Social Work Note (Addendum)
Clinical Social Work Assessment  Patient Details  Name: Harold Cervantes MRN: [id number] Date of Birth: 1932-10-20  Date of referral:  06/09/17               Reason for consult:  Discharge Planning, Facility Placement                Permission sought to share information with:  Chartered certified accountant granted to share information::  Yes, Verbal Permission Granted  Name::      [place]::   [place]  Relationship::     Contact Information:     Housing/Transportation Living arrangements for the past 2 months:  [place] of Information:  Patient, Spouse Patient Interpreter Needed:  None Criminal Activity/Legal Involvement Pertinent to Current Situation/Hospitalization:  No - Comment as needed Significant Relationships:  Spouse Lives with:  Spouse Do you feel safe going back to the place where you live?  Yes Need for family participation in patient care:  Yes (Comment)  Care giving concerns: Patient lives in [place] with his wife Harold Cervantes [phone]).   Social Worker assessment / plan: Holiday representative (CSW) received SNF consult. PT is recommending SNF. Social work Theatre manager met with patient and his wife at bedside. Patient was sitting up in the chair at bedside alert and oriented x4. Social work Theatre manager introduced self and explained the role of the [place]. Patient stated that he lives in [place] with his wife. Social work Theatre manager explained that PT is recommending SNF and the SNF process. Patient and his wife verbalized their understanding shared that he was previously at [facility]. Patient and his wife are agreeable to a SNF search and getting one set up but prefer to go home should patient progress with PT. Patient also shared that he prefers [facility] Social work Theatre manager shared that [facility] mostly has semiprivate rooms. Patient and his wife are verbalized their understanding. FL2  completed and faxed out.  Social work Theatre manager presented bed offers to patient and his wife. Patient chose [facility]. Sharyn Lull, admission coordinator at [facility] is aware of above and stated that patient may come when ready for discharge. CSW and social work Theatre manager will continue to follow and assist.  Employment status:  Retired Nurse, adult PT Recommendations:  [place] / Referral to community resources:  [place]  Patient/Family's Response to care:  Patient is agreeable to SNF and prefers [facility].  Patient/Family's Understanding of and Emotional Response to Diagnosis, Current Treatment, and Prognosis: Patient and his wife were both pleasant and thanked social work Theatre manager for her assistance.  Emotional Assessment Appearance:  Appears stated age Attitude/Demeanor/Rapport:    Affect (typically observed):  Accepting, Calm, Pleasant Orientation:  Oriented to Self, Oriented to Place, Oriented to  Time, Oriented to Situation Alcohol / Substance use:  Not Applicable Psych involvement (Current and /or in the community):  No (Comment)  Discharge Needs  Concerns to be addressed:  Discharge Planning Concerns, Care Coordination Readmission within the last 30 days:  No Current discharge risk:  Dependent with Mobility Barriers to Discharge:  Continued Medical Work up   Smith Mince, Student-Social Work 06/09/2017, 9:50 AM

## 2017-06-09 NOTE — Progress Notes (Signed)
Physical Therapy Treatment Patient Details Name: Harold EisenmengerClarence F Cervantes MRN: [id number] DOB: 1933/06/18 Today's Date: 06/09/2017    History of Present Illness 83 y.o. male s/p fall, L hip fracture and subsequent Hemiarthroplasty (posterior approach) 06/08/17. Pt. PMHx includes: DM, HTN, Lympedema, Pacemaker Insertion, and Hx of L great toe amputation.     PT Comments    Pt showed very good effort but ultimately was significantly limited with transfers, ambulation, mobility and with exercises.  He was better able to control the walker this afternoon but had increased L knee buckling with WBing and struggled with getting to standing as well as getting back to supine.  Pt was able to do supine bed exercises with AA/AROM, but quickly fatigued with lightly resisted acts.  Follow Up Recommendations  SNF     Equipment Recommendations       Recommendations for Other Services       Precautions / Restrictions Precautions Precautions: Posterior Hip Precaution Booklet Issued: Yes (comment) Restrictions LLE Weight Bearing: Weight bearing as tolerated    Mobility  Bed Mobility Overal bed mobility: Needs Assistance Bed Mobility: Sit to Supine       Sit to supine: Mod assist   General bed mobility comments: Pt needed considerable assist to get himself back into bed, showed good effort but little ability to transition  Transfers Overall transfer level: Needs assistance Equipment used: Rolling walker (2 wheeled) Transfers: Sit to/from Stand Sit to Stand: Max assist;Mod assist         General transfer comment: Pt struggled to keep R (and L) LE in position to assist with getting to standing and needed heavy assist from PT to keep upward momentum and hips shifting forward.  Ambulation/Gait Ambulation/Gait assistance: Min assist Ambulation Distance (Feet): 6 Feet Assistive device: Rolling walker (2 wheeled)       General Gait Details: Pt unable to fully trust L LE (pain, weakness?)  and had multiple bouts of minimal buckling with increased WBing requirement, pt heavily reliant on the walker/UEs   Stairs            Wheelchair Mobility    Modified Rankin (Stroke Patients Only)       Balance Overall balance assessment: Needs assistance Sitting-balance support: Bilateral upper extremity supported Sitting balance-Leahy Scale: Good     Standing balance support: Bilateral upper extremity supported Standing balance-Leahy Scale: Fair Standing balance comment: heavy reliance on UEs/walker                            Cognition Arousal/Alertness: Awake/alert Behavior During Therapy: WFL for tasks assessed/performed Overall Cognitive Status: Within Functional Limits for tasks assessed                                        Exercises Total Joint Exercises Ankle Circles/Pumps: Strengthening;10 reps Quad Sets: Strengthening;10 reps Gluteal Sets: Strengthening;10 reps Short Arc Quad: AROM;10 reps Heel Slides: Strengthening;AROM;10 reps Hip ABduction/ADduction: AROM;10 reps    General Comments        Pertinent Vitals/Pain Pain Assessment: 0-10 Pain Score: 7     Home Living                      Prior Function            PT Goals (current goals can now be found in the care plan section)  Frequency    BID      PT Plan Current plan remains appropriate    Co-evaluation              AM-PAC PT "6 Clicks" Daily Activity  Outcome Measure  Difficulty turning over in bed (including adjusting bedclothes, sheets and blankets)?: Unable Difficulty moving from lying on back to sitting on the side of the bed? : Unable Difficulty sitting down on and standing up from a chair with arms (e.g., wheelchair, bedside commode, etc,.)?: Unable Help needed moving to and from a bed to chair (including a wheelchair)?: A Lot Help needed walking in hospital room?: A Lot Help needed climbing 3-5 steps with a railing? :  Total 6 Click Score: 8    End of Session Equipment Utilized During Treatment: Gait belt Activity Tolerance: Patient tolerated treatment well;Patient limited by fatigue Patient left: with chair alarm set;with call bell/phone within reach;with family/visitor present Nurse Communication: Mobility status PT Visit Diagnosis: Muscle weakness (generalized) (M62.81);Difficulty in walking, not elsewhere classified (R26.2);Unsteadiness on feet (R26.81)     Time: 1610-96041517-1601 PT Time Calculation (min) (ACUTE ONLY): 44 min  Charges:  $Gait Training: 8-22 mins $Therapeutic Exercise: 8-22 mins $Therapeutic Activity: 8-22 mins                    G Codes:       Harold Cervantes, DPT 06/09/2017, 5:38 PM

## 2017-06-09 NOTE — Plan of Care (Signed)
Pt. Will require minA self-grooming tasks while standing at the sinkside. Pt. Will be modified independent with LE dressing skills, Pt. Will be independent with toileting skills.

## 2017-06-09 NOTE — Progress Notes (Signed)
Pt's foley was d/c at 6 am today. Pt has been OOB in chair for several hours this afternoon, but was unable to void. IVF had been running at 75 cc/hour today, and PO intake was encouraged. Bladder scan done at 1515 showed 297 cc. Dr. Elisabeth PigeonVachhani notified. Received orders to do bladder scan every 4 hours and I&O cath if >400.   [place], Latricia HeftKorie G

## 2017-06-09 NOTE — Evaluation (Addendum)
Occupational Therapy Evaluation Patient Details Name: Harold Cervantes MRN: [id number] DOB: Jul 18, 1933 Today's Date: 06/09/2017    History of Present Illness Pt. is an 83 y.o. male who was [facility] for Left Hemiarthroplasty repair of a Left Femoral Neck Fracture. Pt. PMHx includes: DM, HTN, Lympedema, Pacemaker Insertion, and Hx of toe amputation.    Clinical Impression   Pt. Is an 83 y.o. Male who was [facility] for a Left Hemiarthroplasty repair of a Left Femoral Neck Fracture. Pt. presents with pain, weakness, posterior hip precautions, decreased activity tolerance, and limited functional mobility which limit her ability to complete ADLs, and IADL tasks. Education was provided to the pt., and family about A/E use for LE ADLS, and posterior hip precautions. Pt. resides at home with his wife who is supportive, and assist pt. ss needed during ADLs, and IADLs. Pt. could benefit from skilled OT services for ADL training, A/E training, UE there. Ex, there. Activity, and pt. Education about energy conservation/work simplification techniques, home modification, and DME. Pt. And wife reports they would like to install grab bars in the tub/shower areas. Pt. Could benefit from follow-up OT services upon discharge.    Follow Up Recommendations  SNF    Equipment Recommendations       Recommendations for Other Services       Precautions / Restrictions Precautions Precautions: Posterior Hip Restrictions Weight Bearing Restrictions: Yes LLE Weight Bearing: Weight bearing as tolerated                                                    ADL either performed or assessed with clinical judgement   ADL Overall ADL's : Needs assistance/impaired Eating/Feeding: Set up;Independent;Sitting   Grooming: Set up;Independent;Sitting   Upper Body Bathing: Set up;Minimal assistance   Lower Body Bathing: Set up;Maximal assistance   Upper Body Dressing : Set up;Minimal  assistance   Lower Body Dressing: Set up;Maximal assistance               Functional mobility during ADLs: (Deferred pt. reports just getting up to chair with PT.) General ADL Comments: Pt. and family education was provided about A/E use for LE ADLs.     Vision Patient Visual Report: No change from baseline       Perception     Praxis      Pertinent Vitals/Pain Pain Assessment: 0-10 Pain Score: 5  Pain Descriptors / Indicators: Aching Pain Intervention(s): Limited activity within patient's tolerance;Monitored during session     Hand Dominance     Extremity/Trunk Assessment Upper Extremity Assessment Upper Extremity Assessment: Overall WFL for tasks assessed           Communication Communication Communication: No difficulties   Cognition Arousal/Alertness: Awake/alert Behavior During Therapy: WFL for tasks assessed/performed Overall Cognitive Status: Within Functional Limits for tasks assessed                                     General Comments       Exercises     Shoulder Instructions      Home Living Family/patient expects to be discharged to:: Skilled nursing facility Living Arrangements: Spouse/significant other Available Help at Discharge: Family Type of Home: House Home Access: Stairs to enter Entergy CorporationEntrance Stairs-Number of  Steps: 2   Home Layout: One level     Bathroom Shower/Tub: Tub/shower unit;Walk-in shower         Home Equipment: Emergency planning/management officerhower seat;Walker - 2 wheels;Cane - single point          Prior Functioning/Environment Level of Independence: Needs assistance    ADL's / Homemaking Assistance Needed: Pt. was indepndent with basic ADL tasks, required assist from his wife for IADL tasks. used a cane in the household, reports having a walker. Uses the grocery store scooters during shopping trips. Has stopped driving.            OT Problem List: Decreased strength;Pain;Decreased activity tolerance;Decreased range of  motion;Impaired balance (sitting and/or standing);Decreased knowledge of use of DME or AE      OT Treatment/Interventions: Self-care/ADL training;Therapeutic exercise;Patient/family education;Therapeutic activities;DME and/or AE instruction    OT Goals(Current goals can be found in the care plan section)    OT Frequency: Min 2X/week   Barriers to D/C:            Co-evaluation              AM-PAC PT "6 Clicks" Daily Activity     Outcome Measure Help from another person eating meals?: None Help from another person taking care of personal grooming?: None Help from another person toileting, which includes using toliet, bedpan, or urinal?: A Lot Help from another person bathing (including washing, rinsing, drying)?: A Lot Help from another person to put on and taking off regular upper body clothing?: A Little Help from another person to put on and taking off regular lower body clothing?: A Lot 6 Click Score: 17   End of Session    Activity Tolerance: Patient tolerated treatment well Patient left: in chair;with call bell/phone within reach;with chair alarm set;with family/visitor present  OT Visit Diagnosis: Unsteadiness on feet (R26.81);Feeding difficulties (R63.3)                Time: [id number] OT Time Calculation (min): 22 min Charges:  OT General Charges $OT Visit: 1 Visit OT Evaluation $OT Eval Moderate Complexity: 1 Mod G-Codes: OT G-codes **NOT FOR INPATIENT CLASS** Functional Assessment Tool Used: AM-PAC 6 Clicks Daily Activity;Clinical judgement Functional Limitation: Self care Self Care Current Status (V4098(G8987): At least 40 percent but less than 60 percent impaired, limited or restricted Self Care Goal Status (J1914(G8988): At least 1 percent but less than 20 percent impaired, limited or restricted   Olegario MessierElaine Kiki Bivens, MS, OTR/L   Olegario MessierElaine Jedediah Noda, MS, OTR/L 06/09/2017, 9:57 AM

## 2017-06-10 ENCOUNTER — Encounter
Admission: RE | Admit: 2017-06-10 | Discharge: 2017-06-10 | Disposition: A | Discharge status: Home / Self Care | Payer: Medicare Other | Source: Ambulatory Visit | Attending: Internal Medicine | Admitting: Internal Medicine

## 2017-06-10 ENCOUNTER — Encounter (INDEPENDENT_AMBULATORY_CARE_PROVIDER_SITE_OTHER): Payer: Medicare Other

## 2017-06-10 DIAGNOSIS — L97909 Non-pressure chronic ulcer of unspecified part of unspecified lower leg with unspecified severity: Secondary | ICD-10-CM

## 2017-06-10 LAB — GLUCOSE, CAPILLARY
Glucose-Capillary: 190 mg/dL — ABNORMAL HIGH (ref 65–99)
Glucose-Capillary: 234 mg/dL — ABNORMAL HIGH (ref 65–99)
Glucose-Capillary: 218 mg/dL — ABNORMAL HIGH (ref 65–99)
Glucose-Capillary: 208 mg/dL — ABNORMAL HIGH (ref 65–99)

## 2017-06-10 LAB — SURGICAL PATHOLOGY

## 2017-06-10 LAB — CBC
HEMATOCRIT: 32.1 % — AB (ref 40.0–52.0)
Hemoglobin: 10.9 g/dL — ABNORMAL LOW (ref 13.0–18.0)
MCH: 31.4 pg (ref 26.0–34.0)
MCHC: 34 g/dL (ref 32.0–36.0)
MCV: 92.3 fL (ref 80.0–100.0)
Platelets: 182 10*3/uL (ref 150–440)
RBC: 3.48 MIL/uL — ABNORMAL LOW (ref 4.40–5.90)
RDW: 14.1 % (ref 11.5–14.5)
WBC: 9.9 10*3/uL (ref 3.8–10.6)

## 2017-06-10 MED ORDER — POLYETHYLENE GLYCOL 3350 17 G PO PACK
17.0000 g | PACK | Freq: Every day | ORAL | Status: DC
  Administered 2017-06-10 – 2017-06-11 (×2): 17 g via ORAL
  Filled 2017-06-10 (×2): qty 1

## 2017-06-10 NOTE — Progress Notes (Signed)
[facility]  Daily Progress Note   Subjective  - 2 Days Post-Op  Patient currently has an Unna boot in place right leg he was placed at my office 1 week ago and he was due to return today for a change.  He denies pain at the site.  Interceed and he has fallen and sustained a hip fracture which is now been repaired.  Is due to be transferred to [facility] tomorrow.  Objective Vitals:   06/09/17 2029 06/10/17 0825 06/10/17 1446 06/10/17 2059  BP: 121/62 (!) 110/55 (!) 99/57 108/70  Pulse: 88 94 81 86  Resp: 19 16 16 16   Temp: 99.7 F (37.6 C) 99.5 F (37.5 C) 98.5 F (36.9 C) 98.6 F (37 C)  TempSrc: Oral Oral  Oral  SpO2: 94% 95% 93% 97%  Weight:      Height:        Intake/Output Summary (Last 24 hours) at 06/10/2017 2121 Last data filed at 06/10/2017 2101 Gross per 24 hour  Intake 3188.75 ml  Output 1225 ml  Net 1963.75 ml    PULM  Normal effort , no use of accessory muscles CV  No JVD, RRR Abd      No distended, nontender VASC  Unna boot clean dry and intact severe venous stasis dermatitis left leg  Laboratory CBC    Component Value Date/Time   WBC 9.9 06/10/2017 0434   HGB 10.9 (L) 06/10/2017 0434   HGB 10.2 (L) 07/19/2012 0444   HCT 32.1 (L) 06/10/2017 0434   HCT 29.9 (L) 07/19/2012 0444   PLT 182 06/10/2017 0434   PLT 346 07/19/2012 0444    BMET    Component Value Date/Time   NA 136 06/09/2017 0347   NA 137 07/19/2012 0444   K 3.3 (L) 06/09/2017 0347   K 3.7 07/19/2012 0444   CL 101 06/09/2017 0347   CL 104 07/19/2012 0444   CO2 28 06/09/2017 0347   CO2 27 07/19/2012 0444   GLUCOSE 177 (H) 06/09/2017 0347   GLUCOSE 165 (H) 07/19/2012 0444   BUN 22 (H) 06/09/2017 0347   BUN 18 07/19/2012 0444   CREATININE 1.26 (H) 06/09/2017 0347   CREATININE 1.31 (H) 07/19/2012 0444   CALCIUM 7.5 (L) 06/09/2017 0347   CALCIUM 9.1 07/19/2012 0444   GFRNONAA 51 (L) 06/09/2017 0347   GFRNONAA 52 (L) 07/19/2012 0444   GFRAA 60 (L) 06/09/2017  0347   GFRAA >60 07/19/2012 0444    Assessment/Planning: At  Venous stasis insufficiency with ulceration.  Patient has done well with Unna boot therapy in the past.  I would recommend we continue with Unna boot therapy.  We will change his Radio broadcast assistantUnna boot tomorrow before transfer to [facility].  I have discussed with the patient that it is likely at [facility] they will have their own wound care specialist see him but that he should follow-up with us once he is discharged.    Levora DredgeGregory Schnier  06/10/2017, 9:21 PM

## 2017-06-10 NOTE — Care Management (Signed)
RNCM still following for home health needs.  CSW following for SNF.

## 2017-06-10 NOTE — Progress Notes (Signed)
Per MD patient will likely be stable for D/C tomorrow. Plan [facility] is aware of above. Clinical Social Worker (CSW) will continue to follow and assist as needed.   Baker Hughes IncorporatedBailey Denia Mcvicar, LCSW [phone]

## 2017-06-10 NOTE — Progress Notes (Signed)
[facility] [place] [facility]   PATIENT NAME: Harold PoagClarence Cervantes    MR#:  [id number]  DATE OF BIRTH:  1933-05-20  SUBJECTIVE:  CHIEF COMPLAINT:   Chief Complaint  Patient presents with  . Fall   Came after a fall, Hip fracture- S/p hemiarthroplasty. No complains.   Had chronic bladder related issues.   Had urinary retention since yesterday, so foley placed today. UA is negative.   No BM yet.  REVIEW OF SYSTEMS:  CONSTITUTIONAL: No fever, fatigue or weakness.  EYES: No blurred or double vision.  EARS, NOSE, AND THROAT: No tinnitus or ear pain.  RESPIRATORY: No cough, shortness of breath, wheezing or hemoptysis.  CARDIOVASCULAR: No chest pain, orthopnea, edema.  GASTROINTESTINAL: No nausea, vomiting, diarrhea or abdominal pain.  GENITOURINARY: No dysuria, hematuria.  ENDOCRINE: No polyuria, nocturia,  HEMATOLOGY: No anemia, easy bruising or bleeding SKIN: No rash or lesion. MUSCULOSKELETAL: left hip pain.   NEUROLOGIC: No tingling, numbness, weakness.  PSYCHIATRY: No anxiety or depression.   ROS  DRUG ALLERGIES:   Allergies  Allergen Reactions  . Pioglitazone Swelling  . Penicillins Rash  . Zolpidem Other (See Comments)    confusion    VITALS:  Blood pressure (!) 99/57, pulse 81, temperature 98.5 F (36.9 C), resp. rate 16, height 6' (1.829 m), weight 98.4 kg (217 lb), SpO2 93 %.  PHYSICAL EXAMINATION:  GENERAL:  83 y.o.-year-old patient lying in the bed with no acute distress.  EYES: Pupils equal, round, reactive to light and accommodation. No scleral icterus. Extraocular muscles intact.  HEENT: Head atraumatic, normocephalic. Oropharynx and nasopharynx clear.  NECK:  Supple, no jugular venous distention. No thyroid enlargement, no tenderness.  LUNGS: Normal breath sounds bilaterally, no wheezing, rales,rhonchi or crepitation. No use of accessory muscles of respiration.  CARDIOVASCULAR: S1, S2 normal. No murmurs, rubs, or gallops.  ABDOMEN:  Soft, nontender, nondistended. Bowel sounds present. No organomegaly or mass.  EXTREMITIES: No pedal edema, cyanosis, or clubbing.  NEUROLOGIC: Cranial nerves II through XII are intact. Muscle strength 4/5 in all extremities, except left lower due to pain. Sensation intact. Gait not checked.  PSYCHIATRIC: The patient is alert and oriented x 3.  SKIN: No obvious rash, lesion, or ulcer.   Physical Exam LABORATORY PANEL:   CBC Recent Labs  Lab 06/10/17 0434  WBC 9.9  HGB 10.9*  HCT 32.1*  PLT 182   ------------------------------------------------------------------------------------------------------------------  Chemistries  Recent Labs  Lab 06/09/17 0347  NA 136  K 3.3*  CL 101  CO2 28  GLUCOSE 177*  BUN 22*  CREATININE 1.26*  CALCIUM 7.5*   ------------------------------------------------------------------------------------------------------------------  Cardiac Enzymes No results for input(s): TROPONINI in the last 168 hours. ------------------------------------------------------------------------------------------------------------------  RADIOLOGY:  No results found.  ASSESSMENT AND PLAN:   Active Problems:   Hip fracture (HCC)   * left femoral neck fracture   S/p left hip hemiarthroplasty 06/08/17.   PT / OT, manage per Ortho.   May need SNF on d/c. Waiting on a BM.  * A fib   Rate is controlled with cardizem and Held xarelto due to need for surgery.   Resume xarelto today.  * hypertension   Currently stable, hold meds.  * DM    Hold oral meds and keep on ISS.  * Urinary retention   Start on flomax, Checked UA.   UA negative.   Keep a foley in, spoke to Urologist, suggest- follow in clinic in 1 week.  * hypokalemia    Replace.  All the records are reviewed and case discussed with Care Management/Social Workerr. Management plans discussed with the patient, family and they are in agreement.  CODE STATUS: full.  TOTAL TIME TAKING CARE OF  THIS PATIENT: 35 minutes.   POSSIBLE D/C IN 2 DAYS, DEPENDING ON CLINICAL CONDITION.   Altamese DillingVACHHANI, Shatira Dobosz M.D on 06/10/2017   Between 7am to 6pm - Pager - [phone]  After 6pm go to [email or web address] - Social research officer, governmentpassword EPAS [facility]  [facility] [place] [facility]  Office  [phone]  CC: Primary care physician; Lauro RegulusAnderson, Marshall W, MD  Note: This dictation was prepared with Dragon dictation along with smaller phrase technology. Any transcriptional errors that result from this process are unintentional.

## 2017-06-10 NOTE — Progress Notes (Signed)
Physical Therapy Treatment Patient Details Name: Harold Cervantes MRN: [id number] DOB: [birth year] Today's Date: 06/10/2017    History of Present Illness 83 y.o. male s/p fall, L hip fracture and subsequent Hemiarthroplasty (posterior approach) 06/08/17. Pt. PMHx includes: DM, HTN, Lympedema, Pacemaker Insertion, and Hx of L great toe amputation.     PT Comments    Pt reports increased pain today with therapy. He is able to complete all supine exercises as instructed with intermittent rest breaks due to increase in pain. He requires modA+2 for bed mobility. Pt requires maxA+2 for transfer from bed to recliner. Cues to avoid hip precautions and for safe hand placement. Once upright pt with poor standing tolerance and heavy UE reliance. Requires repeated cue to stand fully upright. Requires continual min/modA+1 to remains standing. Attempted standing marches but pt unable to perform. Unable to safely ambulate at this time. Performed squat pivot transfer with patient from bed to recliner with maxA+2 and chair rail lowered on recliner. Pt will need Sabina lift to transfer back to bed this afternoon and to work on standing weight shifting. Pt will benefit from PT services to address deficits in strength, balance, and mobility in order to return to full function at home.    Follow Up Recommendations  SNF     Equipment Recommendations  Other (comment)(TBD at facility)    Recommendations for Other Services       Precautions / Restrictions Precautions Precautions: Posterior Hip Precaution Booklet Issued: Yes (comment) Restrictions Weight Bearing Restrictions: Yes LLE Weight Bearing: Weight bearing as tolerated    Mobility  Bed Mobility Overal bed mobility: Needs Assistance Bed Mobility: Supine to Sit     Supine to sit: Mod assist;+2 for physical assistance     General bed mobility comments: Pt requires considerable assist with cues and LE support to get to the EOB. Increased time  required. Good effort by patient  Transfers Overall transfer level: Needs assistance Equipment used: Rolling walker (2 wheeled) Transfers: Sit to/from Stand Sit to Stand: Max assist;+2 physical assistance         General transfer comment: Pt requires heavy assist for transfers with +2. Cues to avoid hip precautions and for safe hand placement. Once upright pt with poor standing tolerance and heavy UE reliance. Requires repeated cue to stand fully upright. Requires continual min/modA+1 to remains standing. Attempted standing marches but pt unable to perform. Unable to safely ambulate at this time. Performed squat pivot transfer with patient from bed to recliner with maxA+2 and chair rail lowered on recliner  Ambulation/Gait             General Gait Details: Unable to perform on this date   Stairs            Wheelchair Mobility    Modified Rankin (Stroke Patients Only)       Balance Overall balance assessment: Needs assistance Sitting-balance support: Bilateral upper extremity supported Sitting balance-Leahy Scale: Fair     Standing balance support: Bilateral upper extremity supported Standing balance-Leahy Scale: Poor Standing balance comment: Heavy UE reliance but also support from therapist                            Cognition Arousal/Alertness: Awake/alert Behavior During Therapy: WFL for tasks assessed/performed Overall Cognitive Status: Within Functional Limits for tasks assessed  Exercises Total Joint Exercises Ankle Circles/Pumps: Strengthening;10 reps Quad Sets: Strengthening;10 reps Gluteal Sets: Strengthening;10 reps Towel Squeeze: Strengthening;10 reps Short Arc Quad: Strengthening;Right;10 reps Heel Slides: Strengthening;Right;10 reps Hip ABduction/ADduction: Strengthening;Right;10 reps Straight Leg Raises: Strengthening;Right;10 reps    General Comments        Pertinent  Vitals/Pain Pain Assessment: Faces Faces Pain Scale: Hurts whole lot Pain Location: Denies pain at rest in L hip, significant increase in L hip pain with all mobility Pain Intervention(s): Monitored during session;Premedicated before session    Home Living                      Prior Function            PT Goals (current goals can now be found in the care plan section) Acute Rehab PT Goals Patient Stated Goal: go home PT Goal Formulation: With patient Time For Goal Achievement: 06/23/17 Potential to Achieve Goals: Fair Progress towards PT goals: Progressing toward goals    Frequency    BID      PT Plan Current plan remains appropriate    Co-evaluation              AM-PAC PT "6 Clicks" Daily Activity  Outcome Measure  Difficulty turning over in bed (including adjusting bedclothes, sheets and blankets)?: Unable Difficulty moving from lying on back to sitting on the side of the bed? : Unable Difficulty sitting down on and standing up from a chair with arms (e.g., wheelchair, bedside commode, etc,.)?: Unable Help needed moving to and from a bed to chair (including a wheelchair)?: A Lot Help needed walking in hospital room?: Total Help needed climbing 3-5 steps with a railing? : Total 6 Click Score: 7    End of Session Equipment Utilized During Treatment: Gait belt Activity Tolerance: Patient tolerated treatment well Patient left: with chair alarm set;with call bell/phone within reach;with family/visitor present;in chair;Other (comment)(pillow for abduction between bilateral LEs) Nurse Communication: Mobility status PT Visit Diagnosis: Muscle weakness (generalized) (M62.81);Difficulty in walking, not elsewhere classified (R26.2);Unsteadiness on feet (R26.81)     Time: [id number] PT Time Calculation (min) (ACUTE ONLY): 27 min  Charges:  $Therapeutic Exercise: 8-22 mins $Therapeutic Activity: 8-22 mins                    G Codes:       Sharalyn InkJason D Huprich  PT, DPT    Huprich,Jason 06/10/2017, 11:28 AM

## 2017-06-10 NOTE — Progress Notes (Addendum)
OT Cancellation Note  Patient Details Name: Harold EisenmengerClarence F Facemire MRN: [id number] DOB: 1932-11-08   Cancelled Treatment:    Reason Eval/Treat Not Completed: Patient declined, no reason specified (Pt. pleasantly declined attempts to practice A/E use for LE ADLs. Pt. reports intervals of 10/10 pain. Pt. education was verbally provided about hip precautions, and A/E. Pt. reports being aware of A/E for LE ADLs. Pt. Reports no further OT concerns at this level of care. Pt. Plans to go to SNF tomorrow.)  Olegario MessierElaine Nilan Iddings, MS, OTR/L 06/10/2017, 4:47 PM

## 2017-06-10 NOTE — Progress Notes (Signed)
Patient bladder scanned result 721cc. MD notified and ordered RN to place foley catheter. RN met resistance, and catheter is flowing slowly, MD notified. No new orders at this time.   Deri Fuelling, RN

## 2017-06-10 NOTE — Care Management (Signed)
Patient has agreed to SNF per his wife and hopes to [facility]. I have updated Barbara CowerJason with [facility]. No other RNCM needs.

## 2017-06-10 NOTE — Progress Notes (Signed)
Physical Therapy Treatment Patient Details Name: Harold EisenmengerClarence F Cervantes MRN: [id number] DOB: 1933/04/23 Today's Date: 06/10/2017    History of Present Illness 83 y.o. male s/p fall, L hip fracture and subsequent Hemiarthroplasty (posterior approach) 06/08/17. Pt. PMHx includes: DM, HTN, Lympedema, Pacemaker Insertion, and Hx of L great toe amputation.     PT Comments    Pt in recliner, ready to return to bed but fearful of transfer after needing increased assist this am.  Reassured pt that we could safely get him back to bed with Rozell SearingSabina lift.  Explained process to pt and he was able to tolerate transfer well with lift.  Upon sitting, urinary catheter was noted to be out.  Christia ReadingSlack was ample in tubing during transfer.  Pt stated he did not feel it pull.  There was noted to be some urine staining on pad and sheets under him so it is unknown when it actually slid out.  Nurse was notified and will address.  He returned to supine with max a x 2 and repositioned to comfort.   Follow Up Recommendations  SNF     Equipment Recommendations  Other (comment)(TBD at facility)    Recommendations for Other Services       Precautions / Restrictions Precautions Precautions: Posterior Hip Precaution Booklet Issued: Yes (comment) Restrictions Weight Bearing Restrictions: Yes LLE Weight Bearing: Weight bearing as tolerated    Mobility  Bed Mobility Overal bed mobility: Needs Assistance Bed Mobility: Sit to Supine     Supine to sit: Mod assist;+2 for physical assistance Sit to supine: Max assist;+2 for physical assistance   General bed mobility comments: Pt requires considerable assist with cues and LE support to get to the EOB. Increased time required. Good effort by patient  Transfers Overall transfer level: Needs assistance Equipment used: Rolling walker (2 wheeled) Transfers: Sit to/from Stand Sit to Stand: Total assist         General transfer comment: Rozell SearingSabina lift used to return to bed  per recommendations of AM session  Ambulation/Gait             General Gait Details: Unable to perform on this date   Stairs            Wheelchair Mobility    Modified Rankin (Stroke Patients Only)       Balance Overall balance assessment: Needs assistance Sitting-balance support: Bilateral upper extremity supported Sitting balance-Leahy Scale: Fair     Standing balance support: Bilateral upper extremity supported Standing balance-Leahy Scale: Poor Standing balance comment: Heavy UE reliance but also support from therapist                            Cognition Arousal/Alertness: Awake/alert Behavior During Therapy: WFL for tasks assessed/performed Overall Cognitive Status: Within Functional Limits for tasks assessed                                        Exercises Total Joint Exercises Ankle Circles/Pumps: Strengthening;10 reps Quad Sets: Strengthening;10 reps Gluteal Sets: Strengthening;10 reps Towel Squeeze: Strengthening;10 reps Short Arc Quad: Strengthening;Right;10 reps Heel Slides: Strengthening;Right;10 reps Hip ABduction/ADduction: Strengthening;Right;10 reps Straight Leg Raises: Strengthening;Right;10 reps Other Exercises Other Exercises: supine quad and glut sets, ankle pumps, seated AROM LAQ x 10    General Comments        Pertinent Vitals/Pain Pain Assessment: 0-10 Pain Score: 6  Faces Pain Scale: Hurts whole lot Pain Location: L hip, bottom from sitting Pain Descriptors / Indicators: Aching Pain Intervention(s): Limited activity within patient's tolerance;Monitored during session    Home Living                      Prior Function            PT Goals (current goals can now be found in the care plan section) Acute Rehab PT Goals Patient Stated Goal: go home PT Goal Formulation: With patient Time For Goal Achievement: 06/23/17 Potential to Achieve Goals: Fair Progress towards PT goals:  Progressing toward goals    Frequency    BID      PT Plan Current plan remains appropriate    Co-evaluation              AM-PAC PT "6 Clicks" Daily Activity  Outcome Measure  Difficulty turning over in bed (including adjusting bedclothes, sheets and blankets)?: Unable Difficulty moving from lying on back to sitting on the side of the bed? : Unable Difficulty sitting down on and standing up from a chair with arms (e.g., wheelchair, bedside commode, etc,.)?: Unable Help needed moving to and from a bed to chair (including a wheelchair)?: A Lot Help needed walking in hospital room?: Total Help needed climbing 3-5 steps with a railing? : Total 6 Click Score: 7    End of Session Equipment Utilized During Treatment: Gait belt Activity Tolerance: Patient tolerated treatment well Patient left: in bed;with bed alarm set;with call bell/phone within reach;with family/visitor present Nurse Communication: Other (comment) PT Visit Diagnosis: Muscle weakness (generalized) (M62.81);Difficulty in walking, not elsewhere classified (R26.2);Unsteadiness on feet (R26.81)     Time: [id number] PT Time Calculation (min) (ACUTE ONLY): 32 min  Charges:  $Therapeutic Exercise: 8-22 mins $Therapeutic Activity: 8-22 mins                    G Codes:       Danielle DessSarah Anne Boltz, PTA 06/10/17, 1:58 PM

## 2017-06-11 ENCOUNTER — Other Ambulatory Visit: Payer: Self-pay

## 2017-06-11 LAB — MAGNESIUM: Magnesium: 2 mg/dL (ref 1.7–2.4)

## 2017-06-11 LAB — CBC
HEMATOCRIT: 30.1 % — AB (ref 40.0–52.0)
Hemoglobin: 10.5 g/dL — ABNORMAL LOW (ref 13.0–18.0)
MCH: 32.2 pg (ref 26.0–34.0)
MCHC: 35 g/dL (ref 32.0–36.0)
MCV: 92.1 fL (ref 80.0–100.0)
Platelets: 167 10*3/uL (ref 150–440)
RBC: 3.27 MIL/uL — AB (ref 4.40–5.90)
RDW: 14.4 % (ref 11.5–14.5)
WBC: 8.8 10*3/uL (ref 3.8–10.6)

## 2017-06-11 LAB — BASIC METABOLIC PANEL
ANION GAP: 7 (ref 5–15)
BUN: 17 mg/dL (ref 6–20)
CHLORIDE: 104 mmol/L (ref 101–111)
CO2: 22 mmol/L (ref 22–32)
Calcium: 7.4 mg/dL — ABNORMAL LOW (ref 8.9–10.3)
Creatinine, Ser: 1.01 mg/dL (ref 0.61–1.24)
GFR calc Af Amer: >60 mL/min (ref >60)
GFR calc non Af Amer: >60 mL/min (ref >60)
GLUCOSE: 192 mg/dL — AB (ref 65–99)
POTASSIUM: 4.6 mmol/L (ref 3.5–5.1)
Sodium: 133 mmol/L — ABNORMAL LOW (ref 135–145)

## 2017-06-11 LAB — GLUCOSE, CAPILLARY
GLUCOSE-CAPILLARY: 218 mg/dL — AB (ref 65–99)
Glucose-Capillary: 197 mg/dL — ABNORMAL HIGH (ref 65–99)

## 2017-06-11 MED ORDER — ACETAMINOPHEN 325 MG PO TABS: 650.0000 mg | ORAL_TABLET | ORAL | 0 refills | Status: AC | PRN

## 2017-06-11 MED ORDER — TORSEMIDE 5 MG PO TABS: 5.0000 mg | ORAL_TABLET | Freq: Every day | ORAL | 0 refills | Status: AC

## 2017-06-11 MED ORDER — BISACODYL 10 MG RE SUPP
10.0000 mg | Freq: Once | RECTAL | Status: AC
  Filled 2017-06-11: qty 1

## 2017-06-11 MED ORDER — OXYCODONE HCL 5 MG PO TABS: 5.0000 mg | ORAL_TABLET | ORAL | 0 refills | Status: DC | PRN

## 2017-06-11 MED ORDER — DOCUSATE SODIUM 100 MG PO CAPS: 100.0000 mg | ORAL_CAPSULE | Freq: Two times a day (BID) | ORAL | 0 refills | Status: AC

## 2017-06-11 MED ORDER — TAMSULOSIN HCL 0.4 MG PO CAPS: 0.4000 mg | ORAL_CAPSULE | Freq: Every day | ORAL | 0 refills | Status: AC

## 2017-06-11 MED ORDER — POLYETHYLENE GLYCOL 3350 17 G PO PACK: 17.0000 g | PACK | Freq: Every day | ORAL | 0 refills | Status: AC

## 2017-06-11 MED ORDER — POTASSIUM CHLORIDE CRYS ER 20 MEQ PO TBCR: 20.0000 meq | EXTENDED_RELEASE_TABLET | Freq: Every day | ORAL | 0 refills | Status: AC

## 2017-06-11 NOTE — Progress Notes (Signed)
PT Cancellation Note  Patient Details Name: Harold EisenmengerClarence F Cervantes MRN: [id number] DOB: Oct 08, 1932   Cancelled Treatment:    Reason Eval/Treat Not Completed: Other (comment)   Several attempts this am were made to see pt.  Pt in too much pain, with nursing for care and on 3rd attempt he had received suppository and declined session.  Anticipate discharge to SNF today.  Will attempt again prior to discharge as time allows.   Danielle DessSarah Elton Catalano 06/11/2017, 11:14 AM

## 2017-06-11 NOTE — Progress Notes (Signed)
[facility] via non[facility]emergency transport without incident per MD order. Wife called and notified of husband's transfer when ambulance arrived. Prior to discharge Una boot changed to RLE. Pt's BLE are dry and scaley and blue discoloration. Pulses bilaterally weak. +sensory and motor sensation. Pt pain controlled on d/c. No change in pt from AM assessment except pt had a BM today.Rept called to As[facility]issa [facility]. AVS printed off and sent with patient in packet for Melissa to have patient's follow appointments day and times.

## 2017-06-11 NOTE — Clinical Social Work Placement (Signed)
   CLINICAL SOCIAL WORK PLACEMENT  NOTE  Date:  06/11/2017  Patient Details  Name: Harold EisenmengerClarence F Larmore MRN: [id number] Date of Birth: [birth year]  Clinical Social Work is seeking post-discharge placement for this patient at the Skilled  Nursing Facility level of care (*CSW will initial, date and re-position this form in  chart as items are completed):  Yes   Patient/family provided with [place] Clinical Social Work Department's list of facilities offering this level of care within the geographic area requested by the patient (or if unable, by the patient's family).  Yes   Patient/family informed of their freedom to choose among providers that offer the needed level of care, that participate in Medicare, Medicaid or managed care program needed by the patient, have an available bed and are willing to accept the patient.  Yes   Patient/family informed of [facility]'s ownership interest in [facility] and [facility], as well as of the fact that they are under no obligation to receive care at these facilities.  PASRR submitted to EDS on       PASRR number received on       Existing PASRR number confirmed on 06/09/17     FL2 transmitted to all facilities in geographic area requested by pt/family on 06/09/17     FL2 transmitted to all facilities within larger geographic area on       Patient informed that his/her managed care company has contracts with or will negotiate with certain facilities, including the following:        Yes   Patient/family informed of bed offers received.  Patient chooses bed at [facility] )     Physician recommends and patient chooses bed at      Patient to be transferred to [facility] ) on 06/11/17.  Patient to be transferred to facility by [facility] )     Patient family notified on 06/11/17 of transfer.  Name of family member notified:  (Patient's wife Kathaleen GrinderJoyce Kulig is aware of D/C today. )     PHYSICIAN        Additional Comment:    _______________________________________________ Verta Riedlinger, Darleen CrockerBailey M, LCSW 06/11/2017, 11:42 AM

## 2017-06-11 NOTE — Discharge Summary (Signed)
[facility] - [place] at [facility]   PATIENT NAME: Harold PoagClarence Cervantes    MR#:  [id number]  DATE OF BIRTH:  02-20-1933  DATE OF ADMISSION:  06/08/2017 ADMITTING PHYSICIAN: Ihor AustinPavan Pyreddy, MD  DATE OF DISCHARGE: 06/11/2017  PRIMARY CARE PHYSICIAN: Lauro RegulusAnderson, Marshall W, MD    ADMISSION DIAGNOSIS:  Closed fracture of left hip, initial encounter (HCC) [S72.002A]  DISCHARGE DIAGNOSIS:  Active Problems:   Hip fracture (HCC)   SECONDARY DIAGNOSIS:   Past Medical History:  Diagnosis Date  . A-fib (HCC)   . Diabetes mellitus without complication (HCC)   . Hypertension     HOSPITAL COURSE:   * left femoral neck fracture   S/p left hip hemiarthroplasty 06/08/17.   PT / OT, manage per Ortho.   May need SNF on d/c.   * A fib   Rate is controlled with cardizem and Held xarelto due to need for surgery.   Resume xarelto after surgery.  * hypertension   Currently stable, hold meds.   Stable resum some of BP meds on d/c.  * DM    Hold oral meds and keep on ISS.   Resume meds now as stable.  * Urinary retention   Start on flomax, Checked UA.   UA negative.   Keep a foley in, spoke to Urologist, suggest- follow in clinic in 1 week.  * hypokalemia    Replace.  * Venous stasis and insufficiency   Have Una boots by vascular clinic.   Wound care specialist to follow at SNF.    After there, he can follow with Dr. Edrick KinsSchiner in clinic.   DISCHARGE CONDITIONS:   Stable.  CONSULTS OBTAINED:  Treatment Team:  Renford DillsSchnier, Gregory G, MD  DRUG ALLERGIES:   Allergies  Allergen Reactions  . Pioglitazone Swelling  . Penicillins Rash  . Zolpidem Other (See Comments)    confusion    DISCHARGE MEDICATIONS:   Current Discharge Medication List    START taking these medications   Details  acetaminophen (TYLENOL) 325 MG tablet Take 2 tablets (650 mg total) every 4 (four) hours as needed by mouth for mild pain or fever ((score 1 to 3) or temp >  100.5). Qty: 30 tablet, Refills: 0    docusate sodium (COLACE) 100 MG capsule Take 1 capsule (100 mg total) 2 (two) times daily by mouth. Qty: 10 capsule, Refills: 0    oxyCODONE (OXY IR/ROXICODONE) 5 MG immediate release tablet Take 1 tablet (5 mg total) every 4 (four) hours as needed by mouth for moderate pain or severe pain ((score 4 to 6)). Qty: 30 tablet, Refills: 0    polyethylene glycol (MIRALAX / GLYCOLAX) packet Take 17 g daily by mouth. Qty: 14 each, Refills: 0    potassium chloride SA (K-DUR,KLOR-CON) 20 MEQ tablet Take 1 tablet (20 mEq total) daily by mouth. Qty: 30 tablet, Refills: 0    tamsulosin (FLOMAX) 0.4 MG CAPS capsule Take 1 capsule (0.4 mg total) daily by mouth. Qty: 30 capsule, Refills: 0      CONTINUE these medications which have CHANGED   Details  torsemide (DEMADEX) 5 MG tablet Take 1 tablet (5 mg total) daily by mouth. Qty: 20 tablet, Refills: 0      CONTINUE these medications which have NOT CHANGED   Details  atorvastatin (LIPITOR) 40 MG tablet Take 40 mg by mouth daily.    bisacodyl (DULCOLAX) 5 MG EC tablet Take 15 mg every 3 (three) days by mouth.    carbamazepine (  CARBATROL) 200 MG 12 hr capsule Take 200 mg 2 (two) times daily by mouth.  Refills: 1    CARTIA XT 180 MG 24 hr capsule Take 1 capsule by mouth daily Refills: 1    gabapentin (NEURONTIN) 300 MG capsule Take 300 mg 2 (two) times daily by mouth.     glipiZIDE (GLUCOTROL) 5 MG tablet Take 5 mg 2 (two) times daily before a meal by mouth.  Refills: 0    magnesium oxide (MAG-OX) 400 MG tablet TAKE 1 TABLET BY MOUTH TWICE DAILY    metFORMIN (GLUCOPHAGE-XR) 500 MG 24 hr tablet Take 500 mg daily by mouth.     metolazone (ZAROXOLYN) 2.5 MG tablet Take 2.5 mg daily by mouth.  Refills: 2    RAPAFLO 8 MG CAPS capsule TK 1 C PO QD WITH BREAKFAST Qty: 30 capsule, Refills: 11    XARELTO 15 MG TABS tablet Take one tablet by mouth daily Refills: 0    ACCU-CHEK SOFTCLIX LANCETS lancets U  UTD BID Refills: 0      STOP taking these medications     metoprolol tartrate (LOPRESSOR) 25 MG tablet      potassium chloride (K-DUR) 10 MEQ tablet      spironolactone (ALDACTONE) 25 MG tablet      cyanocobalamin (,VITAMIN B-12,) 1000 MCG/ML injection          DISCHARGE INSTRUCTIONS:    Routine foley care, Follow with Urology clinic in 1 week, Follow with ortho clinic in 1-2 weeks.  If you experience worsening of your admission symptoms, develop shortness of breath, life threatening emergency, suicidal or homicidal thoughts you must seek medical attention immediately by calling 911 or calling your MD immediately  if symptoms less severe.  You Must read complete instructions/literature along with all the possible adverse reactions/side effects for all the Medicines you take and that have been prescribed to you. Take any new Medicines after you have completely understood and accept all the possible adverse reactions/side effects.   Please note  You were cared for by a hospitalist during your hospital stay. If you have any questions about your discharge medications or the care you received while you were in the hospital after you are discharged, you can call the unit and asked to speak with the hospitalist on call if the hospitalist that took care of you is not available. Once you are discharged, your primary care physician will handle any further medical issues. Please note that NO REFILLS for any discharge medications will be authorized once you are discharged, as it is imperative that you return to your primary care physician (or establish a relationship with a primary care physician if you do not have one) for your aftercare needs so that they can reassess your need for medications and monitor your lab values.    Today   CHIEF COMPLAINT:   Chief Complaint  Patient presents with  . Fall    HISTORY OF PRESENT ILLNESS:  Harold Cervantes  is a 83 y.o. male with a known  history of atrial fibrillation, diabetes mellitus, hypertension presented to the emergency room because of fall. Patient called up early this morning and he turned around lost balance and fell on his left hip. Patient has pain which is aching in nature 8 out of 10 on a scale of 1-10 in the left hip. No history of any head injury. No loss of consciousness. No seizure activity. Patient was evaluated with hip x-ray in the emergency room which showed left  femoral neck fracture. Hospitalist service was consulted. Orthopedic surgery consultant on call was informed by ER physician. No complaints of any chest pain, shortness of breath.   VITAL SIGNS:  Blood pressure 108/70, pulse 86, temperature 98.6 F (37 C), temperature source Oral, resp. rate 16, height 6' (1.829 m), weight 98.4 kg (217 lb), SpO2 97 %.  I/O:    Intake/Output Summary (Last 24 hours) at 06/11/2017 1040 Last data filed at 06/11/2017 0503 Gross per 24 hour  Intake 1950 ml  Output 1325 ml  Net 625 ml    PHYSICAL EXAMINATION:   GENERAL:  83 y.o.-year-old patient lying in the bed with no acute distress.  EYES: Pupils equal, round, reactive to light and accommodation. No scleral icterus. Extraocular muscles intact.  HEENT: Head atraumatic, normocephalic. Oropharynx and nasopharynx clear.  NECK:  Supple, no jugular venous distention. No thyroid enlargement, no tenderness.  LUNGS: Normal breath sounds bilaterally, no wheezing, rales,rhonchi or crepitation. No use of accessory muscles of respiration.  CARDIOVASCULAR: S1, S2 normal. No murmurs, rubs, or gallops.  ABDOMEN: Soft, nontender, nondistended. Bowel sounds present. No organomegaly or mass.  EXTREMITIES: No pedal edema, cyanosis, or clubbing.  NEUROLOGIC: Cranial nerves II through XII are intact. Muscle strength 4/5 in all extremities, except left lower due to pain. Sensation intact. Gait not checked.  PSYCHIATRIC: The patient is alert and oriented x 3.  SKIN: No obvious rash,  lesion, or ulcer.     DATA REVIEW:   CBC Recent Labs  Lab 06/11/17 0322  WBC 8.8  HGB 10.5*  HCT 30.1*  PLT 167    Chemistries  Recent Labs  Lab 06/11/17 0322  NA 133*  K 4.6  CL 104  CO2 22  GLUCOSE 192*  BUN 17  CREATININE 1.01  CALCIUM 7.4*  MG 2.0    Cardiac Enzymes No results for input(s): TROPONINI in the last 168 hours.  Microbiology Results  Results for orders placed or performed during the hospital encounter of 06/08/17  Surgical PCR screen     Status: Abnormal   Collection Time: 06/08/17  8:08 AM  Result Value Ref Range Status   MRSA, PCR POSITIVE (A) NEGATIVE Final    Comment: RESULT CALLED TO, READ BACK BY AND VERIFIED WITH: Shelbie AmmonsKorie Hudson @ [id number] 06/08/17 [facility]    Staphylococcus aureus POSITIVE (A) NEGATIVE Final    Comment: (NOTE) The Xpert SA Assay (FDA approved for NASAL specimens in patients 81 years of age and older), is one component of a comprehensive surveillance program. It is not intended to diagnose infection nor to guide or monitor treatment.     RADIOLOGY:  No results found.  EKG:   Orders placed or performed during the hospital encounter of 06/08/17  . EKG 12-Lead  . EKG 12-Lead  . ED EKG  . ED EKG      Management plans discussed with the patient, family and they are in agreement.  CODE STATUS:     Code Status Orders  (From admission, onward)        Start     Ordered   06/08/17 0756  Full code  Continuous     06/08/17 0755    Code Status History    Date Active Date Inactive Code Status Order ID Comments User Context   This patient has a current code status but no historical code status.      TOTAL TIME TAKING CARE OF THIS PATIENT: 35 minutes.    Altamese DillingVACHHANI, Keegen Heffern M.D on 06/11/2017 at 10:40  AM  Between 7am to 6pm - Pager - [phone]  After 6pm go to [email or web address] - Social research officer, government  [facility]  Office  [phone]  CC: Primary care physician; Lauro Regulus,  MD   Note: This dictation was prepared with Dragon dictation along with smaller phrase technology. Any transcriptional errors that result from this process are unintentional.

## 2017-06-11 NOTE — Telephone Encounter (Signed)
Rx sent to [facility] phone : [phone] , fax : [phone]  

## 2017-06-11 NOTE — Progress Notes (Signed)
Patient is medically stable for D/C [facility]. [facility] can come today to private room 205. RN will call report at [phone] and arrange EMS for transport. Clinical Child psychotherapistocial Worker (CSW) sent [facility] via [employer]. Patient is aware of above. Patient's wife Kathaleen GrinderJoyce Dismuke is at bedside and aware of above. Please reconsult if future social work needs arise. CSW signing off.   [employer] Inc[employer]Bailey Breelynn Bankert, LCSW [phone]

## 2017-06-12 ENCOUNTER — Other Ambulatory Visit: Payer: Self-pay

## 2017-06-12 MED ORDER — OXYCODONE HCL 5 MG PO TABS: 5.0000 mg | ORAL_TABLET | ORAL | 0 refills | Status: AC

## 2017-06-12 NOTE — Telephone Encounter (Signed)
Rx sent to [facility] phone : [phone] , fax : [phone]  

## 2017-06-14 ENCOUNTER — Emergency Department: Payer: Medicare Other

## 2017-06-14 ENCOUNTER — Observation Stay: Payer: Medicare Other

## 2017-06-14 ENCOUNTER — Inpatient Hospital Stay: Payer: Medicare Other

## 2017-06-14 ENCOUNTER — Encounter: Payer: Self-pay | Admitting: Emergency Medicine

## 2017-06-14 ENCOUNTER — Inpatient Hospital Stay
Admission: EM | Admit: 2017-06-14 | Discharge: 2017-06-27 | DRG: 388 | Disposition: E | Payer: Medicare Other | Attending: Internal Medicine | Admitting: Internal Medicine

## 2017-06-14 ENCOUNTER — Other Ambulatory Visit: Payer: Self-pay

## 2017-06-14 DIAGNOSIS — J9601 Acute respiratory failure with hypoxia: Secondary | ICD-10-CM | POA: Diagnosis not present

## 2017-06-14 DIAGNOSIS — Z7901 Long term (current) use of anticoagulants: Secondary | ICD-10-CM

## 2017-06-14 DIAGNOSIS — R402114 Coma scale, eyes open, never, 24 hours or more after hospital admission: Secondary | ICD-10-CM | POA: Diagnosis not present

## 2017-06-14 DIAGNOSIS — R569 Unspecified convulsions: Secondary | ICD-10-CM | POA: Diagnosis not present

## 2017-06-14 DIAGNOSIS — J9690 Respiratory failure, unspecified, unspecified whether with hypoxia or hypercapnia: Secondary | ICD-10-CM

## 2017-06-14 DIAGNOSIS — Z452 Encounter for adjustment and management of vascular access device: Secondary | ICD-10-CM

## 2017-06-14 DIAGNOSIS — R402214 Coma scale, best verbal response, none, 24 hours or more after hospital admission: Secondary | ICD-10-CM | POA: Diagnosis not present

## 2017-06-14 DIAGNOSIS — N138 Other obstructive and reflux uropathy: Secondary | ICD-10-CM | POA: Diagnosis present

## 2017-06-14 DIAGNOSIS — L8990 Pressure ulcer of unspecified site, unspecified stage: Secondary | ICD-10-CM

## 2017-06-14 DIAGNOSIS — Z79899 Other long term (current) drug therapy: Secondary | ICD-10-CM

## 2017-06-14 DIAGNOSIS — Z7984 Long term (current) use of oral hypoglycemic drugs: Secondary | ICD-10-CM | POA: Diagnosis not present

## 2017-06-14 DIAGNOSIS — K567 Ileus, unspecified: Secondary | ICD-10-CM | POA: Diagnosis present

## 2017-06-14 DIAGNOSIS — D500 Iron deficiency anemia secondary to blood loss (chronic): Secondary | ICD-10-CM | POA: Diagnosis present

## 2017-06-14 DIAGNOSIS — Z96642 Presence of left artificial hip joint: Secondary | ICD-10-CM | POA: Diagnosis present

## 2017-06-14 DIAGNOSIS — I469 Cardiac arrest, cause unspecified: Secondary | ICD-10-CM

## 2017-06-14 DIAGNOSIS — R402314 Coma scale, best motor response, none, 24 hours or more after hospital admission: Secondary | ICD-10-CM | POA: Diagnosis not present

## 2017-06-14 DIAGNOSIS — N170 Acute kidney failure with tubular necrosis: Secondary | ICD-10-CM | POA: Diagnosis present

## 2017-06-14 DIAGNOSIS — N401 Enlarged prostate with lower urinary tract symptoms: Secondary | ICD-10-CM | POA: Diagnosis present

## 2017-06-14 DIAGNOSIS — E1165 Type 2 diabetes mellitus with hyperglycemia: Secondary | ICD-10-CM | POA: Diagnosis present

## 2017-06-14 DIAGNOSIS — R6521 Severe sepsis with septic shock: Secondary | ICD-10-CM | POA: Diagnosis not present

## 2017-06-14 DIAGNOSIS — Z8673 Personal history of transient ischemic attack (TIA), and cerebral infarction without residual deficits: Secondary | ICD-10-CM

## 2017-06-14 DIAGNOSIS — Z888 Allergy status to other drugs, medicaments and biological substances status: Secondary | ICD-10-CM | POA: Diagnosis not present

## 2017-06-14 DIAGNOSIS — G931 Anoxic brain damage, not elsewhere classified: Secondary | ICD-10-CM | POA: Diagnosis not present

## 2017-06-14 DIAGNOSIS — I482 Chronic atrial fibrillation: Secondary | ICD-10-CM | POA: Diagnosis present

## 2017-06-14 DIAGNOSIS — Z87891 Personal history of nicotine dependence: Secondary | ICD-10-CM

## 2017-06-14 DIAGNOSIS — R570 Cardiogenic shock: Secondary | ICD-10-CM | POA: Diagnosis not present

## 2017-06-14 DIAGNOSIS — I10 Essential (primary) hypertension: Secondary | ICD-10-CM | POA: Diagnosis present

## 2017-06-14 DIAGNOSIS — Z515 Encounter for palliative care: Secondary | ICD-10-CM | POA: Diagnosis present

## 2017-06-14 DIAGNOSIS — R0902 Hypoxemia: Secondary | ICD-10-CM

## 2017-06-14 DIAGNOSIS — J9600 Acute respiratory failure, unspecified whether with hypoxia or hypercapnia: Secondary | ICD-10-CM | POA: Diagnosis not present

## 2017-06-14 DIAGNOSIS — R34 Anuria and oliguria: Secondary | ICD-10-CM | POA: Diagnosis present

## 2017-06-14 DIAGNOSIS — Z880 Allergy status to penicillin: Secondary | ICD-10-CM | POA: Diagnosis not present

## 2017-06-14 DIAGNOSIS — Z950 Presence of cardiac pacemaker: Secondary | ICD-10-CM

## 2017-06-14 DIAGNOSIS — N179 Acute kidney failure, unspecified: Secondary | ICD-10-CM | POA: Diagnosis not present

## 2017-06-14 DIAGNOSIS — Z89422 Acquired absence of other left toe(s): Secondary | ICD-10-CM | POA: Diagnosis not present

## 2017-06-14 DIAGNOSIS — G253 Myoclonus: Secondary | ICD-10-CM | POA: Diagnosis not present

## 2017-06-14 DIAGNOSIS — Z4659 Encounter for fitting and adjustment of other gastrointestinal appliance and device: Secondary | ICD-10-CM

## 2017-06-14 DIAGNOSIS — K5903 Drug induced constipation: Secondary | ICD-10-CM | POA: Diagnosis present

## 2017-06-14 DIAGNOSIS — E1140 Type 2 diabetes mellitus with diabetic neuropathy, unspecified: Secondary | ICD-10-CM | POA: Diagnosis present

## 2017-06-14 DIAGNOSIS — T402X5A Adverse effect of other opioids, initial encounter: Secondary | ICD-10-CM | POA: Diagnosis present

## 2017-06-14 DIAGNOSIS — R0603 Acute respiratory distress: Secondary | ICD-10-CM | POA: Diagnosis not present

## 2017-06-14 DIAGNOSIS — A419 Sepsis, unspecified organism: Secondary | ICD-10-CM | POA: Diagnosis not present

## 2017-06-14 DIAGNOSIS — R7881 Bacteremia: Secondary | ICD-10-CM | POA: Diagnosis not present

## 2017-06-14 DIAGNOSIS — J690 Pneumonitis due to inhalation of food and vomit: Secondary | ICD-10-CM | POA: Diagnosis present

## 2017-06-14 DIAGNOSIS — B9620 Unspecified Escherichia coli [E. coli] as the cause of diseases classified elsewhere: Secondary | ICD-10-CM | POA: Diagnosis present

## 2017-06-14 DIAGNOSIS — R062 Wheezing: Secondary | ICD-10-CM

## 2017-06-14 DIAGNOSIS — Z66 Do not resuscitate: Secondary | ICD-10-CM | POA: Diagnosis present

## 2017-06-14 DIAGNOSIS — D72819 Decreased white blood cell count, unspecified: Secondary | ICD-10-CM | POA: Diagnosis present

## 2017-06-14 DIAGNOSIS — E876 Hypokalemia: Secondary | ICD-10-CM | POA: Diagnosis not present

## 2017-06-14 HISTORY — DX: Benign prostatic hyperplasia without lower urinary tract symptoms: N40.0

## 2017-06-14 LAB — COMPREHENSIVE METABOLIC PANEL
ALBUMIN: 2.5 g/dL — AB (ref 3.5–5.0)
ALT: 18 U/L (ref 17–63)
ANION GAP: 14 (ref 5–15)
AST: 29 U/L (ref 15–41)
Alkaline Phosphatase: 98 U/L (ref 38–126)
BUN: 24 mg/dL — ABNORMAL HIGH (ref 6–20)
CHLORIDE: 95 mmol/L — AB (ref 101–111)
CO2: 26 mmol/L (ref 22–32)
Calcium: 8.6 mg/dL — ABNORMAL LOW (ref 8.9–10.3)
Creatinine, Ser: 1 mg/dL (ref 0.61–1.24)
GFR calc non Af Amer: >60 mL/min (ref >60)
GLUCOSE: 224 mg/dL — AB (ref 65–99)
POTASSIUM: 3.5 mmol/L (ref 3.5–5.1)
SODIUM: 135 mmol/L (ref 135–145)
Total Bilirubin: 1.6 mg/dL — ABNORMAL HIGH (ref 0.3–1.2)
Total Protein: 6 g/dL — ABNORMAL LOW (ref 6.5–8.1)

## 2017-06-14 LAB — CBC
HEMATOCRIT: 33.2 % — AB (ref 40.0–52.0)
HEMOGLOBIN: 11.8 g/dL — AB (ref 13.0–18.0)
MCH: 32.1 pg (ref 26.0–34.0)
MCHC: 35.4 g/dL (ref 32.0–36.0)
MCV: 90.7 fL (ref 80.0–100.0)
Platelets: 351 10*3/uL (ref 150–440)
RBC: 3.66 MIL/uL — AB (ref 4.40–5.90)
RDW: 14.2 % (ref 11.5–14.5)
WBC: 10.3 10*3/uL (ref 3.8–10.6)

## 2017-06-14 LAB — URINALYSIS, COMPLETE (UACMP) WITH MICROSCOPIC
BILIRUBIN URINE: NEGATIVE
GLUCOSE, UA: NEGATIVE mg/dL
KETONES UR: 5 mg/dL — AB
Leukocytes, UA: NEGATIVE
Nitrite: NEGATIVE
PH: 5 (ref 5.0–8.0)
PROTEIN: 30 mg/dL — AB
Specific Gravity, Urine: 1.021 (ref 1.005–1.030)
Squamous Epithelial / LPF: NONE SEEN

## 2017-06-14 LAB — C DIFFICILE QUICK SCREEN W PCR REFLEX
C DIFFICILE (CDIFF) INTERP: NOT DETECTED
C DIFFICILE (CDIFF) TOXIN: NEGATIVE
C Diff antigen: NEGATIVE

## 2017-06-14 LAB — LIPASE, BLOOD: LIPASE: 83 U/L — AB (ref 11–51)

## 2017-06-14 MED ORDER — SENNOSIDES 8.8 MG/5ML PO SYRP
5.0000 mL | ORAL_SOLUTION | Freq: Two times a day (BID) | ORAL | Status: DC | PRN
  Filled 2017-06-14: qty 5

## 2017-06-14 MED ORDER — GABAPENTIN 300 MG PO CAPS
300.0000 mg | ORAL_CAPSULE | Freq: Two times a day (BID) | ORAL | Status: DC
  Administered 2017-06-14: 300 mg via ORAL
  Filled 2017-06-14 (×2): qty 1

## 2017-06-14 MED ORDER — FLEET ENEMA 7-19 GM/118ML RE ENEM: 1.0000 | ENEMA | Freq: Every day | RECTAL | Status: DC

## 2017-06-14 MED ORDER — METHYLNALTREXONE BROMIDE 12 MG/0.6ML ~~LOC~~ SOLN
12.0000 mg | Freq: Once | SUBCUTANEOUS | Status: AC
  Administered 2017-06-14: 12 mg via SUBCUTANEOUS
  Filled 2017-06-14: qty 0.6

## 2017-06-14 MED ORDER — SPIRONOLACTONE 25 MG PO TABS: 25.0000 mg | ORAL_TABLET | Freq: Every day | ORAL | Status: DC

## 2017-06-14 MED ORDER — FENTANYL BOLUS VIA INFUSION
25.0000 ug | INTRAVENOUS | Status: DC | PRN
  Filled 2017-06-14: qty 50

## 2017-06-14 MED ORDER — MAGNESIUM OXIDE 400 (241.3 MG) MG PO TABS
400.0000 mg | ORAL_TABLET | Freq: Two times a day (BID) | ORAL | Status: DC
  Administered 2017-06-14: 400 mg via ORAL
  Filled 2017-06-14: qty 1

## 2017-06-14 MED ORDER — METOPROLOL TARTRATE 25 MG PO TABS
25.0000 mg | ORAL_TABLET | Freq: Two times a day (BID) | ORAL | Status: DC
  Administered 2017-06-14: 25 mg via ORAL
  Filled 2017-06-14: qty 1

## 2017-06-14 MED ORDER — ONDANSETRON HCL 4 MG PO TABS: 4.0000 mg | ORAL_TABLET | Freq: Four times a day (QID) | ORAL | Status: DC | PRN

## 2017-06-14 MED ORDER — POTASSIUM CHLORIDE CRYS ER 20 MEQ PO TBCR: 10.0000 meq | EXTENDED_RELEASE_TABLET | Freq: Every day | ORAL | Status: DC

## 2017-06-14 MED ORDER — TAMSULOSIN HCL 0.4 MG PO CAPS: 0.4000 mg | ORAL_CAPSULE | Freq: Every day | ORAL | Status: DC

## 2017-06-14 MED ORDER — ONDANSETRON HCL 4 MG/2ML IJ SOLN
4.0000 mg | Freq: Four times a day (QID) | INTRAMUSCULAR | Status: DC | PRN
  Administered 2017-06-14: 4 mg via INTRAVENOUS
  Filled 2017-06-14: qty 2

## 2017-06-14 MED ORDER — POLYETHYLENE GLYCOL 3350 17 G PO PACK: 17.0000 g | PACK | Freq: Every day | ORAL | Status: DC

## 2017-06-14 MED ORDER — ACETAMINOPHEN 325 MG PO TABS: 650.0000 mg | ORAL_TABLET | ORAL | Status: DC | PRN

## 2017-06-14 MED ORDER — BISACODYL 10 MG RE SUPP
10.0000 mg | Freq: Every day | RECTAL | Status: DC
  Administered 2017-06-14 – 2017-06-15 (×2): 10 mg via RECTAL
  Filled 2017-06-14 (×2): qty 1

## 2017-06-14 MED ORDER — IPRATROPIUM-ALBUTEROL 0.5-2.5 (3) MG/3ML IN SOLN
3.0000 mL | Freq: Four times a day (QID) | RESPIRATORY_TRACT | Status: DC
  Administered 2017-06-14 – 2017-06-17 (×11): 3 mL via RESPIRATORY_TRACT
  Filled 2017-06-14 (×11): qty 3

## 2017-06-14 MED ORDER — OXYCODONE HCL 5 MG PO TABS
5.0000 mg | ORAL_TABLET | ORAL | Status: DC | PRN
Start: 2017-06-14 — End: 2017-06-15

## 2017-06-14 MED ORDER — VANCOMYCIN HCL IN DEXTROSE 1-5 GM/200ML-% IV SOLN
1000.0000 mg | Freq: Once | INTRAVENOUS | Status: AC
  Administered 2017-06-15: 1000 mg via INTRAVENOUS
  Filled 2017-06-14: qty 200

## 2017-06-14 MED ORDER — MIDAZOLAM HCL 2 MG/2ML IJ SOLN
INTRAMUSCULAR | Status: AC
  Administered 2017-06-14
  Filled 2017-06-14: qty 2

## 2017-06-14 MED ORDER — IOPAMIDOL (ISOVUE-300) INJECTION 61%
30.0000 mL | Freq: Once | INTRAVENOUS | Status: AC | PRN
  Administered 2017-06-14: 30 mL via ORAL

## 2017-06-14 MED ORDER — PIPERACILLIN-TAZOBACTAM 3.375 G IVPB
3.3750 g | Freq: Three times a day (TID) | INTRAVENOUS | Status: DC
  Administered 2017-06-15 (×3): 3.375 g via INTRAVENOUS
  Filled 2017-06-14 (×3): qty 50

## 2017-06-14 MED ORDER — INSULIN ASPART 100 UNIT/ML ~~LOC~~ SOLN
0.0000 [IU] | SUBCUTANEOUS | Status: DC
  Administered 2017-06-15: 3 [IU] via SUBCUTANEOUS
  Administered 2017-06-15 (×2): 2 [IU] via SUBCUTANEOUS
  Administered 2017-06-15: 3 [IU] via SUBCUTANEOUS
  Administered 2017-06-15: 8 [IU] via SUBCUTANEOUS
  Administered 2017-06-16 (×2): 3 [IU] via SUBCUTANEOUS
  Administered 2017-06-16: 2 [IU] via SUBCUTANEOUS
  Administered 2017-06-16: 3 [IU] via SUBCUTANEOUS
  Administered 2017-06-16: 2 [IU] via SUBCUTANEOUS
  Administered 2017-06-17 (×2): 3 [IU] via SUBCUTANEOUS
  Administered 2017-06-17: 2 [IU] via SUBCUTANEOUS
  Filled 2017-06-14 (×14): qty 1

## 2017-06-14 MED ORDER — PANTOPRAZOLE SODIUM 40 MG IV SOLR
40.0000 mg | Freq: Every day | INTRAVENOUS | Status: DC
  Administered 2017-06-15 – 2017-06-17 (×3): 40 mg via INTRAVENOUS
  Filled 2017-06-14 (×3): qty 40

## 2017-06-14 MED ORDER — FENTANYL CITRATE (PF) 100 MCG/2ML IJ SOLN
50.0000 ug | Freq: Once | INTRAMUSCULAR | Status: AC
  Administered 2017-06-15: 50 ug via INTRAVENOUS
  Filled 2017-06-14: qty 2

## 2017-06-14 MED ORDER — CHLORHEXIDINE GLUCONATE 0.12% ORAL RINSE (MEDLINE KIT)
15.0000 mL | Freq: Two times a day (BID) | OROMUCOSAL | Status: DC
  Administered 2017-06-15 – 2017-06-17 (×4): 15 mL via OROMUCOSAL

## 2017-06-14 MED ORDER — FENTANYL 2500MCG IN NS 250ML (10MCG/ML) PREMIX INFUSION
25.0000 ug/h | INTRAVENOUS | Status: DC
  Administered 2017-06-15: 50 ug/h via INTRAVENOUS
  Filled 2017-06-14: qty 250

## 2017-06-14 MED ORDER — DEXTROSE 5 % IV SOLN
2.0000 g | Freq: Two times a day (BID) | INTRAVENOUS | Status: DC
  Filled 2017-06-14 (×2): qty 2

## 2017-06-14 MED ORDER — BUDESONIDE 0.5 MG/2ML IN SUSP
0.5000 mg | Freq: Two times a day (BID) | RESPIRATORY_TRACT | Status: DC
  Administered 2017-06-14 – 2017-06-15 (×2): 0.5 mg via RESPIRATORY_TRACT
  Filled 2017-06-14 (×2): qty 2

## 2017-06-14 MED ORDER — ORAL CARE MOUTH RINSE
15.0000 mL | Freq: Four times a day (QID) | OROMUCOSAL | Status: DC
  Administered 2017-06-15 – 2017-06-17 (×12): 15 mL via OROMUCOSAL

## 2017-06-14 MED ORDER — RIVAROXABAN 15 MG PO TABS: 15.0000 mg | ORAL_TABLET | Freq: Every day | ORAL | Status: DC

## 2017-06-14 MED ORDER — HALOPERIDOL LACTATE 5 MG/ML IJ SOLN
1.0000 mg | Freq: Once | INTRAMUSCULAR | Status: AC
  Administered 2017-06-14: 1 mg via INTRAVENOUS
  Filled 2017-06-14: qty 0.2

## 2017-06-14 MED ORDER — MAGNESIUM HYDROXIDE 400 MG/5ML PO SUSP
30.0000 mL | ORAL | Status: DC | PRN
  Filled 2017-06-14: qty 30

## 2017-06-14 MED ORDER — MIDAZOLAM HCL 2 MG/2ML IJ SOLN
1.0000 mg | INTRAMUSCULAR | Status: AC | PRN
  Administered 2017-06-15: 2 mg via INTRAVENOUS
  Administered 2017-06-15: 4 mg via INTRAVENOUS
  Administered 2017-06-15: 2 mg via INTRAVENOUS
  Filled 2017-06-14 (×4): qty 2

## 2017-06-14 MED ORDER — IOPAMIDOL (ISOVUE-300) INJECTION 61%
100.0000 mL | Freq: Once | INTRAVENOUS | Status: AC | PRN
  Administered 2017-06-14: 100 mL via INTRAVENOUS

## 2017-06-14 MED ORDER — DOCUSATE SODIUM 100 MG PO CAPS
100.0000 mg | ORAL_CAPSULE | Freq: Two times a day (BID) | ORAL | Status: DC
  Administered 2017-06-14: 100 mg via ORAL
  Filled 2017-06-14 (×2): qty 1

## 2017-06-14 MED ORDER — MIDAZOLAM HCL 2 MG/2ML IJ SOLN
1.0000 mg | INTRAMUSCULAR | Status: DC | PRN
  Administered 2017-06-15: 2 mg via INTRAVENOUS
  Filled 2017-06-14 (×2): qty 2

## 2017-06-14 MED ORDER — ATORVASTATIN CALCIUM 20 MG PO TABS
40.0000 mg | ORAL_TABLET | Freq: Every day | ORAL | Status: DC
  Administered 2017-06-14: 40 mg via ORAL
  Filled 2017-06-14 (×2): qty 2

## 2017-06-14 MED ORDER — DILTIAZEM HCL ER COATED BEADS 180 MG PO CP24
180.0000 mg | ORAL_CAPSULE | Freq: Every day | ORAL | Status: DC
  Filled 2017-06-14: qty 1

## 2017-06-14 MED ORDER — CARBAMAZEPINE ER 200 MG PO TB12
200.0000 mg | ORAL_TABLET | Freq: Two times a day (BID) | ORAL | Status: DC
  Administered 2017-06-14: 200 mg via ORAL
  Filled 2017-06-14 (×2): qty 1

## 2017-06-14 NOTE — Progress Notes (Signed)
Pharmacy Antibiotic Note  Harold EisenmengerClarence F Cervantes is a 83 y.o. male admitted on 06/19/2017 with aspiration pneumonia.  Pharmacy has been consulted for vancomycin and Zosyn dosing.  Plan: DW 84kg  Vd 59L kei 0.063 hr-1  T1/2 11 hours Vancomycin 1 gram q 12 hours ordered with stacked dosing. Level before 5th dose. Goal trough 15-20.  Zosyn 3.375 grams q 8 hours ordered.  Height: 6' (182.9 cm) Weight: 220 lb (99.8 kg) IBW/kg (Calculated) : 77.6  Temp (24hrs), Avg:97.5 F (36.4 C), Min:97.3 F (36.3 C), Max:97.7 F (36.5 C)  Recent Labs  Lab 06/08/17 0456 06/09/17 0347 06/10/17 0434 06/11/17 0322 06/05/2017 0940  WBC 7.3 8.5 9.9 8.8 10.3  CREATININE 0.96 1.26*  --  1.01 1.00    Estimated Creatinine Clearance: 69.7 mL/min (by C-G formula based on SCr of 1 mg/dL).    Allergies  Allergen Reactions  . Pioglitazone Swelling  . Penicillins Rash  . Zolpidem Other (See Comments)    confusion    Antimicrobials this admission: Vancomycin, Zosyn 11/19>    >>   Dose adjustments this admission:   Microbiology results: 11/18 BCx: pending 11/18 UCx: pending  11/19 Sputum: pending  11/12 MRSA PCR: (+)      11/18 UA: (-) 11/18 CXR: pending Thank you for allowing pharmacy to be a part of this patient's care.  Kejon Feild S 06/25/2017 11:57 PM

## 2017-06-14 NOTE — Consult Note (Signed)
PULMONARY / CRITICAL CARE MEDICINE   Name: Harold Cervantes MRN: [id number] DOB: 08-Sep-1932    ADMISSION DATE:  06/09/2017   CONSULTATION DATE:  06/06/2017  REFERRING MD: Dr Anne Hahn  Reason: Cardiac arrest  Brief summary: 83 year old Caucasian male with a left breast and left hip replacement who was admitted with opioid-induced ileus, multiple episodes of nausea and and severe and then developed acute respiratory distress and proceeded into cardiopulmonary arrest.  He was resuscitated with successful return of spontaneous circulation.  He is currently in shock requiring pressors.  HISTORY OF PRESENT ILLNESS:   This is an 83 year old male with a past medical history of atrial fibrillation, hypertension, diabetes mellitus, BPH, and recent left hip replacement on 06/08/2017 who presented to the ED with complaints of abdominal pain, times 1 day, nausea, vomiting and severe constipation.  He was diagnosed with an ileus and admitted to the surgical floor for further management.  This evening, patient became more confused, hypoxic; had a large dark brown emesis and then became unresponsive.  [facility] was called and patient was resuscitated for approximately 10 minutes.  He is now intubated and sedated He remains in cardiogenic shock with mean arterial blood pressures in the 50s despite fluid resuscitation.  His NG tube is draining dark stool like fluid; approximately 1600 cc.  He has also had 2 large soft bowel  movements.  Post resuscitation labs are unremarkable except for an elevated creatinine of 1.6.  There is a small drop in his hemoglobin from 11.8-10.0.  PAST MEDICAL HISTORY :  He  has a past medical history of A-fib (HCC), BPH (benign prostatic hyperplasia), Diabetes mellitus without complication (HCC), and Hypertension.  PAST SURGICAL HISTORY: He  has a past surgical history that includes Toe amputation (Left); Pacemaker insertion; Toe amputation (Left); Hip Arthroplasty (Left,  06/08/2017); and Joint replacement.  Allergies  Allergen Reactions  . Pioglitazone Swelling  . Penicillins Rash  . Zolpidem Other (See Comments)    confusion    No current facility-administered medications on file prior to encounter.    Current Outpatient Medications on File Prior to Encounter  Medication Sig  . atorvastatin (LIPITOR) 40 MG tablet Take 40 mg by mouth daily.  . bisacodyl (DULCOLAX) 5 MG EC tablet Take 15 mg every 3 (three) days by mouth.  . carbamazepine (CARBATROL) 200 MG 12 hr capsule Take 200 mg 2 (two) times daily by mouth.   Nancie Neas XT 180 MG 24 hr capsule Take 1 capsule by mouth daily  . docusate sodium (COLACE) 100 MG capsule Take 1 capsule (100 mg total) 2 (two) times daily by mouth.  . gabapentin (NEURONTIN) 300 MG capsule Take 300 mg 2 (two) times daily by mouth.   Marland Kitchen glipiZIDE (GLUCOTROL) 5 MG tablet Take 5 mg 2 (two) times daily before a meal by mouth.   . magnesium hydroxide (MILK OF MAGNESIA) 400 MG/5ML suspension Take 30 mLs every 4 (four) hours as needed by mouth for mild constipation.  . magnesium oxide (MAG-OX) 400 MG tablet TAKE 1 TABLET BY MOUTH TWICE DAILY  . metFORMIN (GLUCOPHAGE-XR) 500 MG 24 hr tablet Take 500 mg daily by mouth.   . metolazone (ZAROXOLYN) 2.5 MG tablet Take 2.5 mg daily by mouth.   . metoprolol tartrate (LOPRESSOR) 25 MG tablet Take 25 mg 2 (two) times daily by mouth.  . oxyCODONE (OXY IR/ROXICODONE) 5 MG immediate release tablet Take 1 tablet (5 mg total) See admin instructions by mouth. Take 1 tab (5 mg) by mouth  every 4 hours prn for mild pain and take 2 tabs (10 mg) by mouth every 4 hours prn for moderate to severe pain  . polyethylene glycol (MIRALAX / GLYCOLAX) packet Take 17 g daily by mouth.  . potassium chloride SA (K-DUR,KLOR-CON) 20 MEQ tablet Take 1 tablet (20 mEq total) daily by mouth. (Patient taking differently: Take 10 mEq daily by mouth. )  . senna (SENOKOT) 8.6 MG tablet Take 1 tablet 2 (two) times daily by mouth.   . silodosin (RAPAFLO) 8 MG CAPS capsule Take 8 mg daily with breakfast by mouth.  . spironolactone (ALDACTONE) 25 MG tablet Take 25 mg daily by mouth.  . torsemide (DEMADEX) 5 MG tablet Take 1 tablet (5 mg total) daily by mouth. (Patient taking differently: Take 5 mg 3 (three) times daily as needed by mouth. )  . XARELTO 15 MG TABS tablet Take one tablet by mouth daily  . ACCU-CHEK SOFTCLIX LANCETS lancets U UTD BID  . acetaminophen (TYLENOL) 325 MG tablet Take 2 tablets (650 mg total) every 4 (four) hours as needed by mouth for mild pain or fever ((score 1 to 3) or temp > 100.5).  . tamsulosin (FLOMAX) 0.4 MG CAPS capsule Take 1 capsule (0.4 mg total) daily by mouth. (Patient not taking: Reported on 2017-03-05)    FAMILY HISTORY:  His indicated that his mother is deceased. He indicated that his father is deceased. He indicated that the status of his sister is unknown.   SOCIAL HISTORY: He  reports that he has quit smoking. he has never used smokeless tobacco. He reports that he does not drink alcohol or use drugs.  REVIEW OF SYSTEMS:   Unable to obtain as patient is currently intubated and sedated  SUBJECTIVE:   VITAL SIGNS: BP (!) 131/52   Pulse 88   Temp 97.7 F (36.5 C) (Oral)   Resp 20   Ht 6' (1.829 m)   Wt 99.8 kg (220 lb)   SpO2 93%   BMI 29.84 kg/m   HEMODYNAMICS:    VENTILATOR SETTINGS: Vent Mode: PRVC FiO2 (%):  [100 %] 100 % Set Rate:  [18 bmp] 18 bmp Vt Set:  [500 mL] 500 mL PEEP:  [5 cmH20] 5 cmH20 Plateau Pressure:  [16 cmH20] 16 cmH20  INTAKE / OUTPUT: I/O last 3 completed shifts: In: -  Out: 400 [Urine:400]  PHYSICAL EXAMINATION: General: No acute distress Neuro: Unresponsive to voice touch and noxious stimulus, no corneal reflexes, myoclonus HEENT: Pupils pinpoint, sluggish, trachea midline, neck is supple with no JVD Cardiovascular: Apical pulse irregular, tachycardic at 120 bpm, S1-S2, no murmur regurg or gallop, +2 pulses bilaterally, no  edema Lungs: Bilateral breath sounds, diminished in the bases, coarse rhonchi in right lung fields Abdomen: Distended, hypoactive bowel sounds, palpation reveals no organomegaly Musculoskeletal: Left hip incision with dressing intact positive range of motion in upper and lower extremities Skin: Warm and dry, venous stasis discoloration in bilateral lower extremities LABS:  BMET Recent Labs  Lab 06/09/17 0347 06/11/17 0322 2017-02-17 0940  NA 136 133* 135  K 3.3* 4.6 3.5  CL 101 104 95*  CO2 28 22 26   BUN 22* 17 24*  CREATININE 1.26* 1.01 1.00  GLUCOSE 177* 192* 224*    Electrolytes Recent Labs  Lab 06/09/17 0347 06/11/17 0322 2017-02-17 0940  CALCIUM 7.5* 7.4* 8.6*  MG  --  2.0  --     CBC Recent Labs  Lab 06/10/17 0434 06/11/17 0322 2017-02-17 0940  WBC 9.9 8.8 10.3  HGB 10.9* 10.5* 11.8*  HCT 32.1* 30.1* 33.2*  PLT 182 167 351    Coag's Recent Labs  Lab 06/08/17 0456  INR 1.45    Sepsis Markers No results for input(s): LATICACIDVEN, PROCALCITON, O2SATVEN in the last 168 hours.  ABG No results for input(s): PHART, PCO2ART, PO2ART in the last 168 hours.  Liver Enzymes Recent Labs  Lab 06/22/2017 0940  AST 29  ALT 18  ALKPHOS 98  BILITOT 1.6*  ALBUMIN 2.5*    Cardiac Enzymes No results for input(s): TROPONINI, PROBNP in the last 168 hours.  Glucose Recent Labs  Lab 06/10/17 0743 06/10/17 1203 06/10/17 1636 06/10/17 2207 06/11/17 0754 06/11/17 1212  GLUCAP 190* 234* 218* 208* 197* 218*    Imaging Ct Abdomen Pelvis W Contrast  Result Date: 06/18/2017 CLINICAL DATA:  Abdominal pain, distention EXAM: CT ABDOMEN AND PELVIS WITH CONTRAST TECHNIQUE: Multidetector CT imaging of the abdomen and pelvis was performed using the standard protocol following bolus administration of intravenous contrast. CONTRAST:  ISOVUE-300 IOPAMIDOL (ISOVUE-300) INJECTION 61% COMPARISON:  None. FINDINGS: Lower chest: Cardiomegaly. Pacer wires noted in the right  heart. Trace bilateral pleural effusions with bibasilar atelectasis. Hepatobiliary: No focal hepatic abnormality. Gallbladder unremarkable. Pancreas: No focal abnormality or ductal dilatation. Spleen: No focal abnormality.  Normal size. Adrenals/Urinary Tract: No adrenal abnormality. No focal renal abnormality. No stones or hydronephrosis. Urinary bladder is unremarkable. Foley catheter in place within the bladder which is decompressed. Stomach/Bowel: There is fluid and gas distention of the small bowel and right colon and transverse colon. Distal small bowel is decompressed without focal abrupt caliber change Vascular/Lymphatic: Aortic and iliac calcifications. No aneurysm. No adenopathy. Reproductive:  No visible focal abnormality. Other: No free fluid or free air. Small bilateral inguinal hernias containing fat. Musculoskeletal: No acute bony abnormality. Prior left hip replacement. IMPRESSION: Dilated small bowel loops with air-fluid levels. The distal small bowel loop is decompressed without abrupt caliber change. Cannot completely exclude distal small bowel obstruction. However, the right colon and transverse colon are also moderately dilated with fluid and gas. Findings could reflect ileus. Trace bilateral pleural effusions, bibasilar atelectasis. Cardiomegaly. Aortic atherosclerosis. Electronically Signed   By: Charlett Nose M.D.   On: 06/18/2017 13:10   Dg Chest Port 1 View  Result Date: 06/13/2017 CLINICAL DATA:  AFib and hypertension EXAM: PORTABLE CHEST 1 VIEW COMPARISON:  06/08/2017 FINDINGS: 1413 hours. The cardio pericardial silhouette is enlarged. The lungs are clear without focal pneumonia, edema, or pneumothorax. Possible tiny left pleural effusion. Old left clavicle fracture noted. Left permanent pacemaker remains in place. Telemetry leads overlie the chest. IMPRESSION: Cardiomegaly with possible tiny left pleural effusion. Electronically Signed   By: Kennith Center M.D.   On: 06/10/2017 14:43    STUDIES:  2D echo ordered and pending  CULTURES: Blood cultures x2 Urine culture Respiratory cultures  ANTIBIOTICS: Vancomycin Zosyn  SIGNIFICANT EVENTS: 11/12> left hip replacement 11/18> admitted with ileus; developed acute respiratory distress and cardiopulmonary arrest secondary to hypoxemia and possible aspiration pneumonitis  LINES/TUBES: Peripheral IVs Foley catheter ET tube Left IJ  DISCUSSION: 83 year old male presenting with cardiopulmonary arrest secondary to aspiration pneumonia, cardiac shock refractory to fluid resuscitation, and CVA 82 years  ASSESSMENT / PLAN:  PULMONARY A: Acute respiratory failure secondary to cardiac arrest Aspiration pneumonia P:   Full vent support with current settings Post resuscitation ABG and chest x-ray reviewed Weaning trials as tolerated Antibiotics as above Follow-up sputum cultures  CARDIOVASCULAR A:  Cardiogenic shock History of atrial fibrillation  P:  Central venous catheter and arterial catheter placed IV fluids Neo-Synephrine and phenylephrine infusions, titrate to maintain mean arterial blood pressure greater than 65 Hemodynamic monitoring per ICU protocol CVP is every 4 hours EKG 2D echo to evaluate left ventricular ejection fraction Continue Xarelto RENAL A:   AKI P:   Trend creatinine Monitor and replace electrolytes  GASTROINTESTINAL A:   Severe ileus secondary to opioid induced constipation-patient is now having loose stools P:   NG tube intermittent suction Laxatives as needed Protonix for GI prophylaxis  HEMATOLOGIC A:   Anemia-likely secondary to blood loss from surgery and dilution P:  Trend CBC and transfuse as needed per protocol  DVT prophylaxis: Patient is already on full strength anticoagulation  INFECTIOUS A:   Aspiration pneumonitis Questionable intra-abdominal infection secondary to severe constipation P:   Antibiotics as above Follow-up cultures  ENDOCRINE A:   Hyperglycemia History of type 2 diabetes P:   Blood glucose monitoring with sliding scale insulin coverage Monitor and treat hypoglycemia NEUROLOGIC A:   Post resuscitation myoclonus Hypoxic encephalopathy P:   RASS goal: 0 to-1 Fentanyl and Versed for vent discomfort and sedation Monitor neurologic status Patient is not a candidate for targeted temperature management secondary to recent surgery   FAMILY  - Updates: Patient's son and wife updated at bedside.  All questions answered.  Patient is a full code  - Inter-disciplinary family meet or Palliative Care meeting due by: day 7  Total CC time 120 minutes  Jester Klingberg S. [facility] ANP-BC Pulmonary and Critical Care Medicine [facility] Pager [phone] or [phone]  06/06/2017, 11:46 PM

## 2017-06-14 NOTE — ED Triage Notes (Signed)
[facility] with [facility]/o abdominal pain since yesterday.  Pt has not had BM since Friday, pt feels constipated. Per EMS pt was given prune juice and MOM prior to arrival to ED.  Abd is distended. Pt had left hip fracture sx on Monday. Pt has foley catheter in place from discharge from hospital. Pt states he wasn't able to urinate after surgery.

## 2017-06-14 NOTE — ED Notes (Signed)
Pt resting in bed, family at bedside, denies any needs  

## 2017-06-14 NOTE — H&P (Addendum)
[facility] [place] [facility]   PATIENT NAME: Harold PoagClarence Stahlecker    MR#:  [id number]  DATE OF BIRTH:  28-Feb-1933  DATE OF ADMISSION:  April 09, 2017  PRIMARY CARE PHYSICIAN: Lauro RegulusAnderson, Marshall W, MD   REQUESTING/REFERRING PHYSICIAN: Dr Sharyn CreamerMark Quale  CHIEF COMPLAINT:   Chief Complaint  Patient presents with  . Abdominal Pain    HISTORY OF PRESENT ILLNESS:  Harold Cervantes  is a 83 y.o. male with a known history of recent hip fracture.  He had one bowel movement prior to leaving the hospital but did not have another bowel movement at the rehab.  He complains of abdominal pain severe in nature.  Some nausea.  He complains of his abdomen is very distended.  Pain is severe 10 out of 10 intensity.  No radiation just stays in his abdomen.  He stated he started wheezing a little bit after drinking the CT scan contrast.  Still having a lot of pain in his left hip and unable to walk at this point.  In the ER, he had a CT scan of the abdomen that was concerning for ileus.  Hospitalist services were contacted for further evaluation.  PAST MEDICAL HISTORY:   Past Medical History:  Diagnosis Date  . A-fib (HCC)   . BPH (benign prostatic hyperplasia)   . Diabetes mellitus without complication (HCC)   . Hypertension     PAST SURGICAL HISTORY:   Past Surgical History:  Procedure Laterality Date  . ARTHROPLASTY BIPOLAR HIP (HEMIARTHROPLASTY) Left 06/08/2017   Performed by Signa KellPatel, Sunny, MD at [facility]  . JOINT REPLACEMENT    . PACEMAKER INSERTION    . TOE AMPUTATION Left   . TOE AMPUTATION Left     SOCIAL HISTORY:   Social History   Tobacco Use  . Smoking status: Former Games developermoker  . Smokeless tobacco: Never Used  Substance Use Topics  . Alcohol use: No    Frequency: Never    FAMILY HISTORY:   Family History  Problem Relation Age of Onset  . Cancer Mother   . Cancer Father   . Cancer Sister     DRUG ALLERGIES:   Allergies  Allergen Reactions  .  Pioglitazone Swelling  . Penicillins Rash  . Zolpidem Other (See Comments)    confusion    REVIEW OF SYSTEMS:  CONSTITUTIONAL: No fever. positive for weakness. Weight gain 8 lbs in 3 days. EYES: No blurred or double vision.  EARS, NOSE, AND THROAT: No tinnitus or ear pain. No sore throat RESPIRATORY: No cough, shortness of breath, wheezing or hemoptysis.  CARDIOVASCULAR: No chest pain, orthopnea, edema.  GASTROINTESTINAL: Vomited with CT scan contrast.  Positive for distention and abdominal pain. No blood in bowel movements.  Positive for constipation GENITOURINARY: Foley catheter placed on last hospital course ENDOCRINE: No polyuria, nocturia,  HEMATOLOGY: No anemia, easy bruising or bleeding SKIN: No rash or lesion. MUSCULOSKELETAL: Left hip pain NEUROLOGIC: No tingling, numbness, weakness.  PSYCHIATRY: No anxiety or depression.   MEDICATIONS AT HOME:   Prior to Admission medications   Medication Sig Start Date End Date Taking? Authorizing Provider  atorvastatin (LIPITOR) 40 MG tablet Take 40 mg by mouth daily. 12/04/14  Yes [provider]  bisacodyl (DULCOLAX) 5 MG EC tablet Take 15 mg every 3 (three) days by mouth.   Yes [provider]  carbamazepine (CARBATROL) 200 MG 12 hr capsule Take 200 mg 2 (two) times daily by mouth.  04/21/16  Yes [provider]  CARTIA XT 180 MG 24 hr capsule Take 1 capsule by mouth daily 08/08/16  Yes [provider]  docusate sodium (COLACE) 100 MG capsule Take 1 capsule (100 mg total) 2 (two) times daily by mouth. 06/11/17  Yes Altamese Dilling, MD  gabapentin (NEURONTIN) 300 MG capsule Take 300 mg 2 (two) times daily by mouth.  07/12/12  Yes [provider]  glipiZIDE (GLUCOTROL) 5 MG tablet Take 5 mg 2 (two) times daily before a meal by mouth.  04/24/16  Yes [provider]  magnesium hydroxide (MILK OF MAGNESIA) 400 MG/5ML suspension Take 30 mLs every 4 (four) hours as needed by mouth for  mild constipation.   Yes [provider]  magnesium oxide (MAG-OX) 400 MG tablet TAKE 1 TABLET BY MOUTH TWICE DAILY 05/16/16  Yes [provider]  metFORMIN (GLUCOPHAGE-XR) 500 MG 24 hr tablet Take 500 mg daily by mouth.  05/08/16  Yes [provider]  metolazone (ZAROXOLYN) 2.5 MG tablet Take 2.5 mg daily by mouth.  05/19/16  Yes [provider]  metoprolol tartrate (LOPRESSOR) 25 MG tablet Take 25 mg 2 (two) times daily by mouth.   Yes [provider]  oxyCODONE (OXY IR/ROXICODONE) 5 MG immediate release tablet Take 1 tablet (5 mg total) See admin instructions by mouth. Take 1 tab (5 mg) by mouth every 4 hours prn for mild pain and take 2 tabs (10 mg) by mouth every 4 hours prn for moderate to severe pain 06/12/17  Yes Lorenso Quarry, NP  polyethylene glycol (MIRALAX / GLYCOLAX) packet Take 17 g daily by mouth. 06/12/17  Yes Altamese Dilling, MD  potassium chloride SA (K-DUR,KLOR-CON) 20 MEQ tablet Take 1 tablet (20 mEq total) daily by mouth. Patient taking differently: Take 10 mEq daily by mouth.  06/11/17  Yes Altamese Dilling, MD  senna (SENOKOT) 8.6 MG tablet Take 1 tablet 2 (two) times daily by mouth.   Yes [provider]  silodosin (RAPAFLO) 8 MG CAPS capsule Take 8 mg daily with breakfast by mouth.   Yes [provider]  spironolactone (ALDACTONE) 25 MG tablet Take 25 mg daily by mouth.   Yes [provider]  torsemide (DEMADEX) 5 MG tablet Take 1 tablet (5 mg total) daily by mouth. Patient taking differently: Take 5 mg 3 (three) times daily as needed by mouth.  06/11/17  Yes Altamese Dilling, MD  XARELTO 15 MG TABS tablet Take one tablet by mouth daily 04/24/16  Yes [provider]  ACCU-CHEK SOFTCLIX LANCETS lancets U UTD BID 04/01/17   [provider]  acetaminophen (TYLENOL) 325 MG tablet Take 2 tablets (650 mg total) every 4 (four) hours as needed by mouth for mild pain or fever  ((score 1 to 3) or temp > 100.5). 06/11/17   Altamese Dilling, MD  tamsulosin (FLOMAX) 0.4 MG CAPS capsule Take 1 capsule (0.4 mg total) daily by mouth. Patient not taking: Reported on 07-13-17 06/11/17   Altamese Dilling, MD      VITAL SIGNS:  Blood pressure 130/74, pulse 89, temperature (!) 97.3 F (36.3 C), temperature source Oral, resp. rate 20, height 6' (1.829 m), weight 99.8 kg (220 lb), SpO2 95 %.  PHYSICAL EXAMINATION:  GENERAL:  83 y.o.-year-old patient lying in the bed with no acute distress.  EYES: Pupils equal, round, reactive to light and accommodation. No scleral icterus. Extraocular muscles intact.  HEENT: Head atraumatic, normocephalic. Oropharynx and nasopharynx clear.  NECK:  Supple, no jugular venous distention. No thyroid enlargement, no tenderness.  LUNGS: Decreased breath sounds bilaterally, positive wheezing.  No rales,rhonchi or crepitation. No use of accessory muscles of respiration.  CARDIOVASCULAR: S1, S2 irregularly regular.  3 out of 6 systolic murmurs.  ABDOMEN: Soft, generalized tenderness, distended. Bowel sounds diminished. No organomegaly or mass.  EXTREMITIES: 2+ edema, no cyanosis, or clubbing.  NEUROLOGIC: Cranial nerves II through XII are intact.  Patient unable to straight leg raise with the left leg. Sensation intact. Gait not checked.  PSYCHIATRIC: The patient is alert and oriented x 3.  SKIN: Right leg covered with Unna boot.  Left leg chronic lower extremity discoloration  LABORATORY PANEL:   CBC Recent Labs  Lab 06-24-17 0940  WBC 10.3  HGB 11.8*  HCT 33.2*  PLT 351   ------------------------------------------------------------------------------------------------------------------  Chemistries  Recent Labs  Lab 06/11/17 0322 06-24-2017 0940  NA 133* 135  K 4.6 3.5  CL 104 95*  CO2 22 26  GLUCOSE 192* 224*  BUN 17 24*  CREATININE 1.01 1.00  CALCIUM 7.4* 8.6*  MG 2.0  --   AST  --  29  ALT  --  18  ALKPHOS  --   98  BILITOT  --  1.6*   ------------------------------------------------------------------------------------------------------------------   RADIOLOGY:  Ct Abdomen Pelvis W Contrast  Result Date: Jun 24, 2017 CLINICAL DATA:  Abdominal pain, distention EXAM: CT ABDOMEN AND PELVIS WITH CONTRAST TECHNIQUE: Multidetector CT imaging of the abdomen and pelvis was performed using the standard protocol following bolus administration of intravenous contrast. CONTRAST:  ISOVUE-300 IOPAMIDOL (ISOVUE-300) INJECTION 61% COMPARISON:  None. FINDINGS: Lower chest: Cardiomegaly. Pacer wires noted in the right heart. Trace bilateral pleural effusions with bibasilar atelectasis. Hepatobiliary: No focal hepatic abnormality. Gallbladder unremarkable. Pancreas: No focal abnormality or ductal dilatation. Spleen: No focal abnormality.  Normal size. Adrenals/Urinary Tract: No adrenal abnormality. No focal renal abnormality. No stones or hydronephrosis. Urinary bladder is unremarkable. Foley catheter in place within the bladder which is decompressed. Stomach/Bowel: There is fluid and gas distention of the small bowel and right colon and transverse colon. Distal small bowel is decompressed without focal abrupt caliber change Vascular/Lymphatic: Aortic and iliac calcifications. No aneurysm. No adenopathy. Reproductive:  No visible focal abnormality. Other: No free fluid or free air. Small bilateral inguinal hernias containing fat. Musculoskeletal: No acute bony abnormality. Prior left hip replacement. IMPRESSION: Dilated small bowel loops with air-fluid levels. The distal small bowel loop is decompressed without abrupt caliber change. Cannot completely exclude distal small bowel obstruction. However, the right colon and transverse colon are also moderately dilated with fluid and gas. Findings could reflect ileus. Trace bilateral pleural effusions, bibasilar atelectasis. Cardiomegaly. Aortic atherosclerosis. Electronically Signed    By: Charlett Nose M.D.   On: 06-24-2017 13:10    EKG:   Ordered by me  IMPRESSION AND PLAN:   1.  Ileus related to pain medications, abdominal pain generalized with distention.  Patient had a recent hip surgery.  Hopefully can convert over to Tylenol to quickly as possible.  Case discussed with pharmacy staff about Relistar for postoperative constipation and he is a candidate for this.  We will also give Dulcolax suppository and Fleet enema daily.  ER physician spoke with surgery.  Clear liquid diet. 2.  Wheeze.  Chest x-ray ordered stat.  Budesonide and DuoNeb nebulizer solution ordered.  Patient stated the wheeze happened after he drank the CT scan contrast.  Potentially the patient may need systemic steroids or Lasix depending on chest x-ray results. 3.  BPH with urinary obstruction with Foley catheter  in since surgery.  Patient is on BPH medication.  I will not take out the Foley catheter for voiding trial until he has more bowel movements. 4.  Type 2 diabetes with neuropathy.  Put on sliding scale and hold oral medications 5.  Atrial fibrillation.  Anticoagulated with Xarelto and on Cardizem and metoprolol for rate control. 6.  Patient has Unna boot on his right leg  All the records are reviewed and case discussed with ED provider. Management plans discussed with the patient, family and they are in agreement.  CODE STATUS: Full code  TOTAL TIME TAKING CARE OF THIS PATIENT: 50 minutes.    Alford Highlandichard Kael Keetch M.D on 07-01-17 at 2:22 PM  Between 7am to 6pm - Pager - [phone]  After 6pm call admission pager [phone]  [facility] Office  [phone]  CC: Primary care physician; Lauro RegulusAnderson, Marshall W, MD

## 2017-06-14 NOTE — ED Provider Notes (Signed)
[facility] Emergency Department Provider Note   ____________________________________________   First MD Initiated Contact with Patient 07-04-17 1017     (approximate)  I have reviewed the triage vital signs and the nursing notes.   HISTORY  Chief Complaint Abdominal Pain   HPI Harold Cervantes is a 83 y.o. male here for evaluation of abdominal pain and "constipation"  Patient reports that he broke his left hip and had surgery about a week ago.  He left the hospital and was having some abdominal bloating and feeling constipated, however over the last couple of days he is continued to feel very "constipated".  He reports his abdomen seems bloated.  He has had a couple very small loose bowel movements but has not had any large bowel movement and feels like his abdomen continues to get more swollen.  Describes a feeling of discomfort and pain she described as moderate to severe in intensity and feels like "constipation".  He does not however wish for any pain medication as he reports he thinks that it is the pain medicine that is leading to this problem.  He is tried MiraLAX, various stool softeners but not had an enema.  He does not feel as though he has a stool ball impacting in the rectum  No chest pain or abdominal pain.  No vomiting.  No fevers or chills.  No trouble or feeling like his bladder is blocked, reports Foley catheter is been draining normally.  Past Medical History:  Diagnosis Date  . A-fib (HCC)   . Diabetes mellitus without complication (HCC)   . Hypertension     Patient Active Problem List   Diagnosis Date Noted  . Ileus (HCC) Apr 10, 2017  . Hip fracture (HCC) 06/08/2017  . Skin ulcer of right calf, limited to breakdown of skin (HCC) 05/18/2017  . Diabetes (HCC) 05/30/2016  . Essential hypertension 05/30/2016  . Hyperlipidemia 05/30/2016  . Swelling of limb 05/30/2016  . Lymphedema 05/30/2016  . PVD (peripheral vascular disease)  (HCC) 05/30/2016    Past Surgical History:  Procedure Laterality Date  . ARTHROPLASTY BIPOLAR HIP (HEMIARTHROPLASTY) Left 06/08/2017   Performed by Signa KellPatel, Sunny, [facility]  . JOINT REPLACEMENT    . PACEMAKER INSERTION    . TOE AMPUTATION Left   . TOE AMPUTATION Left     Prior to Admission medications   Medication Sig Start Date End Date Taking? Authorizing Provider  atorvastatin (LIPITOR) 40 MG tablet Take 40 mg by mouth daily. 12/04/14  Yes [provider]  bisacodyl (DULCOLAX) 5 MG EC tablet Take 15 mg every 3 (three) days by mouth.   Yes [provider]  carbamazepine (CARBATROL) 200 MG 12 hr capsule Take 200 mg 2 (two) times daily by mouth.  04/21/16  Yes [provider]  CARTIA XT 180 MG 24 hr capsule Take 1 capsule by mouth daily 08/08/16  Yes [provider]  docusate sodium (COLACE) 100 MG capsule Take 1 capsule (100 mg total) 2 (two) times daily by mouth. 06/11/17  Yes Altamese DillingVachhani, Vaibhavkumar, MD  gabapentin (NEURONTIN) 300 MG capsule Take 300 mg 2 (two) times daily by mouth.  07/12/12  Yes [provider]  glipiZIDE (GLUCOTROL) 5 MG tablet Take 5 mg 2 (two) times daily before a meal by mouth.  04/24/16  Yes [provider]  magnesium hydroxide (MILK OF MAGNESIA) 400 MG/5ML suspension Take 30 mLs every 4 (four) hours as needed by mouth for mild constipation.   Yes  [provider]  magnesium oxide (MAG-OX) 400 MG tablet TAKE 1 TABLET BY MOUTH TWICE DAILY 05/16/16  Yes [provider]  metFORMIN (GLUCOPHAGE-XR) 500 MG 24 hr tablet Take 500 mg daily by mouth.  05/08/16  Yes [provider]  metolazone (ZAROXOLYN) 2.5 MG tablet Take 2.5 mg daily by mouth.  05/19/16  Yes [provider]  metoprolol tartrate (LOPRESSOR) 25 MG tablet Take 25 mg 2 (two) times daily by mouth.   Yes [provider]  oxyCODONE (OXY IR/ROXICODONE) 5 MG immediate release tablet Take 1 tablet (5 mg total) See admin  instructions by mouth. Take 1 tab (5 mg) by mouth every 4 hours prn for mild pain and take 2 tabs (10 mg) by mouth every 4 hours prn for moderate to severe pain 06/12/17  Yes Lorenso Quarry, NP  polyethylene glycol (MIRALAX / GLYCOLAX) packet Take 17 g daily by mouth. 06/12/17  Yes Altamese Dilling, MD  potassium chloride SA (K-DUR,KLOR-CON) 20 MEQ tablet Take 1 tablet (20 mEq total) daily by mouth. Patient taking differently: Take 10 mEq daily by mouth.  06/11/17  Yes Altamese Dilling, MD  senna (SENOKOT) 8.6 MG tablet Take 1 tablet 2 (two) times daily by mouth.   Yes [provider]  silodosin (RAPAFLO) 8 MG CAPS capsule Take 8 mg daily with breakfast by mouth.   Yes [provider]  spironolactone (ALDACTONE) 25 MG tablet Take 25 mg daily by mouth.   Yes [provider]  torsemide (DEMADEX) 5 MG tablet Take 1 tablet (5 mg total) daily by mouth. Patient taking differently: Take 5 mg 3 (three) times daily as needed by mouth.  06/11/17  Yes Altamese Dilling, MD  XARELTO 15 MG TABS tablet Take one tablet by mouth daily 04/24/16  Yes [provider]  ACCU-CHEK SOFTCLIX LANCETS lancets U UTD BID 04/01/17   [provider]  acetaminophen (TYLENOL) 325 MG tablet Take 2 tablets (650 mg total) every 4 (four) hours as needed by mouth for mild pain or fever ((score 1 to 3) or temp > 100.5). 06/11/17   Altamese Dilling, MD  tamsulosin (FLOMAX) 0.4 MG CAPS capsule Take 1 capsule (0.4 mg total) daily by mouth. Patient not taking: Reported on 07/03/17 06/11/17   Altamese Dilling, MD    Allergies Pioglitazone; Penicillins; and Zolpidem  Family History  Problem Relation Age of Onset  . Cancer Mother   . Cancer Father   . Cancer Sister     Social History Social History   Tobacco Use  . Smoking status: Former Games developer  . Smokeless tobacco: Never Used  Substance Use Topics  . Alcohol use: No    Frequency: Never  . Drug use: No      Review of Systems Constitutional: No fever/chills Eyes: No visual changes. ENT: No sore throat. Cardiovascular: Denies chest pain. Respiratory: Denies shortness of breath. Gastrointestinal: No vomiting.  No diarrhea except when he does have small bowel movements are brown and loose.  No black or bloody stool. Genitourinary: Negative for dysuria. Musculoskeletal: Negative for back pain.  Left hip is sore, but he reports it seems to be healing well. Skin: Negative for rash. Neurological: Negative for headaches, focal weakness or numbness.    ____________________________________________   PHYSICAL EXAM:  VITAL SIGNS: ED Triage Vitals  Enc Vitals Group     BP 2017-07-03 0927 139/69     Pulse Rate 07/03/2017 0927 98     Resp Jul 03, 2017 0927 (!) 24     Temp  06/04/2017 0927 (!) 97.3 F (36.3 C)     Temp Source 05/30/2017 0927 Oral     SpO2 05/30/2017 0927 95 %     Weight 06/05/2017 0928 220 lb (99.8 kg)     Height 06/03/2017 0928 6' (1.829 m)     Head Circumference --      Peak Flow --      Pain Score 06/06/2017 0927 10     Pain Loc --      Pain Edu? --      Excl. in GC? --     Constitutional: Alert and oriented. Well appearing and in no acute distress. Eyes: Conjunctivae are normal. Head: Atraumatic. Nose: No congestion/rhinnorhea. Mouth/Throat: Mucous membranes are moist. Neck: No stridor.   Cardiovascular: Normal rate, regular rhythm. Grossly normal heart sounds.  Good peripheral circulation. Respiratory: Normal respiratory effort.  No retractions. Lungs CTAB. Gastrointestinal: Soft and moderately tender and notably distended throughout with decreased bowel sounds.  No focal abdominal pain is elicited.  Foley catheter in place, draining clear yellow urine. Musculoskeletal: Bilateral lower extremity venous stasis appearance.  Left hip bandage and dressing demonstrates clean dry and intact surgical scar. Rectal: The patient had a small somewhat loose brown bowel movement just prior to  rectal exam.  On exam there is no blood.  There is no rectal impaction. Neurologic:  Normal speech and language. No gross focal neurologic deficits are appreciated.  Skin:  Skin is warm, dry and intact. No rash noted. Psychiatric: Mood and affect are normal. Speech and behavior are normal.  ____________________________________________   LABS (all labs ordered are listed, but only abnormal results are displayed)  Labs Reviewed  LIPASE, BLOOD - Abnormal; Notable for the following components:      Result Value   Lipase 83 (*)    All other components within normal limits  COMPREHENSIVE METABOLIC PANEL - Abnormal; Notable for the following components:   Chloride 95 (*)    Glucose, Bld 224 (*)    BUN 24 (*)    Calcium 8.6 (*)    Total Protein 6.0 (*)    Albumin 2.5 (*)    Total Bilirubin 1.6 (*)    All other components within normal limits  CBC - Abnormal; Notable for the following components:   RBC 3.66 (*)    Hemoglobin 11.8 (*)    HCT 33.2 (*)    All other components within normal limits  URINALYSIS, COMPLETE (UACMP) WITH MICROSCOPIC - Abnormal; Notable for the following components:   Color, Urine AMBER (*)    APPearance HAZY (*)    Hgb urine dipstick MODERATE (*)    Ketones, ur 5 (*)    Protein, ur 30 (*)    Bacteria, UA RARE (*)    All other components within normal limits  C DIFFICILE QUICK SCREEN W PCR REFLEX   ____________________________________________  EKG   ____________________________________________  RADIOLOGY  Ct Abdomen Pelvis W Contrast  Result Date: 06/08/2017 CLINICAL DATA:  Abdominal pain, distention EXAM: CT ABDOMEN AND PELVIS WITH CONTRAST TECHNIQUE: Multidetector CT imaging of the abdomen and pelvis was performed using the standard protocol following bolus administration of intravenous contrast. CONTRAST:  100mL ISOVUE-300 IOPAMIDOL (ISOVUE-300) INJECTION 61% COMPARISON:  None. FINDINGS: Lower chest: Cardiomegaly. Pacer wires noted in the right  heart. Trace bilateral pleural effusions with bibasilar atelectasis. Hepatobiliary: No focal hepatic abnormality. Gallbladder unremarkable. Pancreas: No focal abnormality or ductal dilatation. Spleen: No focal abnormality.  Normal size. Adrenals/Urinary Tract: No adrenal abnormality. No focal renal abnormality.  No stones or hydronephrosis. Urinary bladder is unremarkable. Foley catheter in place within the bladder which is decompressed. Stomach/Bowel: There is fluid and gas distention of the small bowel and right colon and transverse colon. Distal small bowel is decompressed without focal abrupt caliber change Vascular/Lymphatic: Aortic and iliac calcifications. No aneurysm. No adenopathy. Reproductive:  No visible focal abnormality. Other: No free fluid or free air. Small bilateral inguinal hernias containing fat. Musculoskeletal: No acute bony abnormality. Prior left hip replacement. IMPRESSION: Dilated small bowel loops with air-fluid levels. The distal small bowel loop is decompressed without abrupt caliber change. Cannot completely exclude distal small bowel obstruction. However, the right colon and transverse colon are also moderately dilated with fluid and gas. Findings could reflect ileus. Trace bilateral pleural effusions, bibasilar atelectasis. Cardiomegaly. Aortic atherosclerosis. Electronically Signed   By: Charlett Nose M.D.   On: 06/07/2017 13:10  CT results reviewed, ileus versus possible small bowel obstruction  Clinically, I suspect patient is suffering from an ileus less likely SBO.  General surgery will consult, medical admission plan  ____________________________________________   PROCEDURES  Procedure(s) performed: None  Procedures  Critical Care performed: No  ____________________________________________   INITIAL IMPRESSION / ASSESSMENT AND PLAN / ED COURSE  Pertinent labs & imaging results that were available during my care of the patient were reviewed by me and considered  in my medical decision making (see chart for details).  Differential diagnosis includes but is not limited to, abdominal perforation, aortic dissection, cholecystitis, appendicitis, diverticulitis, colitis, esophagitis/gastritis, kidney stone, pyelonephritis, urinary tract infection, aortic aneurysm. All are considered in decision and treatment plan. Based upon the patient's presentation and risk factors, we will proceed with CT abdomen and pelvis to further evaluate for patient I suspect has either severe constipation, possible ileus, and feel less likely small bowel obstruction.  He denies any infectious symptoms, does not have peritonitis on exam, and his clinical history seems highly suggestive that he may have slow transit due to the narcotics he is on.   Clinical Course as of Jun 14 1354  Wynelle Link Jun 14, 2017  1343 History and CT results reviewed with Dr. Aleen Campi re: illeus vs SBO. Advises medical admission and surgery happy to provide consultation, Dr. Aleen Campi will see in consult with plan for medical service admission.   [MQ]    Clinical Course User Index [MQ] Sharyn Creamer, MD     ____________________________________________   FINAL CLINICAL IMPRESSION(S) / ED DIAGNOSES  Final diagnoses:  Ileus (HCC)      NEW MEDICATIONS STARTED DURING THIS VISIT:  This SmartLink is deprecated. Use AVSMEDLIST instead to display the medication list for a patient.   Note:  This document was prepared using Dragon voice recognition software and may include unintentional dictation errors.     Sharyn Creamer, MD 06/08/2017 1355

## 2017-06-14 NOTE — Progress Notes (Signed)
Called by nursing that patient was confused, and became acutely hypoxic.  Patient initially admitted for ileus s/p hip surgery several days ago.  Placed on non rebreather at 100% O2 and sats were in the mid 80s, slowly came up to 90s.  Had an episode of vomiting, suspect aspiration.  Stat CXR and ABG ordered.  Before Xray could be done patient had another epidose of vomiting and then respiratory code.  Initially had pulse, and preparations made for intubation.  Patient then went pulseless.  CPR initiated and ACLS protocol followed.  Patient received 3 rounds of epinephrine and ROSC was achieved.  HR 150s, BP 170s systolic, O2 sats post intubation by bagging was high 90s.  Transferred to ICU with abx for asp pneumonia started and CXR ordered, as well as OG tube.  Kristeen MissWILLIS, Tonia Avino FIELDING [facility] 06-15-2017, 11:06 PM

## 2017-06-14 NOTE — ED Provider Notes (Signed)
[facility]  Department of Emergency Medicine   Code Blue CONSULT NOTE  Chief Complaint: Cardiac arrest/unresponsive   Level V Caveat: Unresponsive  History of present illness: I was contacted by the hospital for a CODE BLUE cardiac arrest upstairs and presented to the patient's bedside.  On arrival ACLS is undergoing with Dr. Anne HahnWillis at bedside  Briefly, patient is recently postop, had episodes of vomiting and suspected aspiration with respiratory distress recently but subsequently went into arrest before further care to be provided.  Dr. Anne HahnWillis wa't able to obtain return of spontaneous circulation prior to my arrival, but patient remained unresponsive.  ROS: Unable to obtain, Level V caveat  Scheduled Meds: . atorvastatin  40 mg Oral q1800  . bisacodyl  10 mg Rectal Daily  . budesonide (PULMICORT) nebulizer solution  0.5 mg Nebulization BID  . carbamazepine  200 mg Oral BID  . [START ON 06/15/2017] diltiazem  180 mg Oral Daily  . docusate sodium  100 mg Oral BID  . gabapentin  300 mg Oral BID  . ipratropium-albuterol  3 mL Nebulization Q6H  . magnesium oxide  400 mg Oral BID  . metoprolol tartrate  25 mg Oral BID  . [START ON 06/15/2017] potassium chloride SA  10 mEq Oral Daily  . [START ON 06/15/2017] Rivaroxaban  15 mg Oral Daily  . sodium phosphate  1 enema Rectal QHS  . [START ON 06/15/2017] spironolactone  25 mg Oral Daily  . [START ON 06/15/2017] tamsulosin  0.4 mg Oral QPC supper   Continuous Infusions: PRN Meds:.acetaminophen, magnesium hydroxide, ondansetron **OR** ondansetron (ZOFRAN) IV, oxyCODONE Past Medical History:  Diagnosis Date  . A-fib (HCC)   . BPH (benign prostatic hyperplasia)   . Diabetes mellitus without complication (HCC)   . Hypertension    Past Surgical History:  Procedure Laterality Date  . ARTHROPLASTY BIPOLAR HIP (HEMIARTHROPLASTY) Left 06/08/2017   Performed by Signa KellPatel, Sunny, MD at [facility] ORS  . JOINT REPLACEMENT    .  PACEMAKER INSERTION    . TOE AMPUTATION Left   . TOE AMPUTATION Left    Social History   Socioeconomic History  . Marital status: Married    Spouse name: Not on file  . Number of children: Not on file  . Years of education: Not on file  . Highest education level: Not on file  Social Needs  . Financial resource strain: Not on file  . Food insecurity - worry: Not on file  . Food insecurity - inability: Not on file  . Transportation needs - medical: Not on file  . Transportation needs - non-medical: Not on file  Occupational History  . Occupation: retired  Tobacco Use  . Smoking status: Former Games developermoker  . Smokeless tobacco: Never Used  Substance and Sexual Activity  . Alcohol use: No    Frequency: Never  . Drug use: No  . Sexual activity: No  Other Topics Concern  . Not on file  Social History Narrative  . Not on file   Allergies  Allergen Reactions  . Pioglitazone Swelling  . Penicillins Rash  . Zolpidem Other (See Comments)    confusion    Last set of Vital Signs (not current) Vitals:   06/12/2017 1502 06/18/2017 1610  BP: (!) 131/52   Pulse: 88   Resp: 20   Temp: 97.7 F (36.5 C)   SpO2: 94% 93%      Physical Exam  Gen: unresponsive Cardiovascular: Regular rate rhythm, rate of 60 Resp: apneic.  Breath sounds equal bilaterally with bagging  Abd: distended Neuro: GCS 3, unresponsive to pain  HEENT: No blood in posterior pharynx, gag reflex absent . Pharynx clear Neck: No crepitus  Musculoskeletal: No deformity  Skin: warm  Procedures  INTUBATION Performed by: Scotty CourtSTAFFORD, Asani Deniston Required items: required blood products, implants, devices, and special equipment available Patient identity confirmed: provided demographic data and hospital-assigned identification number Time out: Immediately prior to procedure a "time out" was called to verify the correct patient, procedure, equipment, support staff and site/side marked as required. Indications: Respiratory  failure, airway protection  Intubation method: Direct laryngoscopy Preoxygenation: BVM Sedatives: None  Paralytic: None  Tube Size: 8-0 cuffed Post-procedure assessment: chest rise and ETCO2 monitor Breath sounds: equal and absent over the epigastrium Tube secured by Respiratory Therapy Patient tolerated the procedure well with no immediate complications.   Medical Decision making  Respiratory failure secondary to aspiration   Assessment and Plan  Around to the patient's bedside, patient had circulation, was being bagged with good chest rise. However, in the process of assessment in preparing for intubation for airway protection, was noted that blood pressure cuff was unable to obtain a pressure and pulse ox was not returning any results. Repeat pulse check found that the patient had lost pulses. This was within just a few minutes of initial assessment when he was found to have a pulse and clear airway. I then used a laryngoscope to evaluate the patient's airway and found that a puddle of emesis was occluding the airway. CPR was resumed, ACLS per code sheet, intubation was prioritized with 80 tube by direct laryngoscopy and extensive suctioning of emesis from the airway and posterior pharynx. After intubation, return of spontaneous circulation was again obtained and oxygenation on the pulse ox increased to the 90s. Tube was secured by respiratory therapist. Symmetric breath sounds bilaterally, no breath sounds over the stomach. Further care by ICU team.    Sharman CheekStafford, Jaythen Hamme, MD 06/16/2017 2329

## 2017-06-14 NOTE — Consult Note (Signed)
Date of Consultation:  August 03, 2016  Requesting Physician:  Sharyn CreamerMark Quale, MD  Reason for Consultation:  Abdominal distention  History of Present Illness: Harold Cervantes is a 83 y.o. male s/p left hip surgery on 11/12 with Dr. Allena KatzPatel, who presents with constipation and abdominal distention.  He has been feeling bloated over the last few days and constipated.  He tried Miralax and stool softners at home but these have not helped.  He has been taking pain medication for his hip surgery.  Denies any nausea or vomiting at home, but he did have emesis in the ED while drinking contrast for CT scan.  Currently feels like he has hiccups.  Denies any abdominal pain, fevers, chills, chest pain, or shortness of breath, but now in ED has been having some wheezing.  Of note, the patient has not had any abdominal surgeries.  Past Medical History: Past Medical History:  Diagnosis Date  . A-fib (HCC)   . BPH (benign prostatic hyperplasia)   . Diabetes mellitus without complication (HCC)   . Hypertension      Past Surgical History: Past Surgical History:  Procedure Laterality Date  . ARTHROPLASTY BIPOLAR HIP (HEMIARTHROPLASTY) Left 06/08/2017   Performed by Signa KellPatel, Sunny, MD at [facility]  . JOINT REPLACEMENT    . PACEMAKER INSERTION    . TOE AMPUTATION Left   . TOE AMPUTATION Left     Home Medications: Prior to Admission medications   Medication Sig Start Date End Date Taking? Authorizing Provider  atorvastatin (LIPITOR) 40 MG tablet Take 40 mg by mouth daily. 12/04/14  Yes [provider]  bisacodyl (DULCOLAX) 5 MG EC tablet Take 15 mg every 3 (three) days by mouth.   Yes [provider]  carbamazepine (CARBATROL) 200 MG 12 hr capsule Take 200 mg 2 (two) times daily by mouth.  04/21/16  Yes [provider]  CARTIA XT 180 MG 24 hr capsule Take 1 capsule by mouth daily 08/08/16  Yes [provider]  docusate sodium (COLACE) 100 MG capsule Take 1 capsule (100 mg  total) 2 (two) times daily by mouth. 06/11/17  Yes Altamese DillingVachhani, Vaibhavkumar, MD  gabapentin (NEURONTIN) 300 MG capsule Take 300 mg 2 (two) times daily by mouth.  07/12/12  Yes [provider]  glipiZIDE (GLUCOTROL) 5 MG tablet Take 5 mg 2 (two) times daily before a meal by mouth.  04/24/16  Yes [provider]  magnesium hydroxide (MILK OF MAGNESIA) 400 MG/5ML suspension Take 30 mLs every 4 (four) hours as needed by mouth for mild constipation.   Yes [provider]  magnesium oxide (MAG-OX) 400 MG tablet TAKE 1 TABLET BY MOUTH TWICE DAILY 05/16/16  Yes [provider]  metFORMIN (GLUCOPHAGE-XR) 500 MG 24 hr tablet Take 500 mg daily by mouth.  05/08/16  Yes [provider]  metolazone (ZAROXOLYN) 2.5 MG tablet Take 2.5 mg daily by mouth.  05/19/16  Yes [provider]  metoprolol tartrate (LOPRESSOR) 25 MG tablet Take 25 mg 2 (two) times daily by mouth.   Yes [provider]  oxyCODONE (OXY IR/ROXICODONE) 5 MG immediate release tablet Take 1 tablet (5 mg total) See admin instructions by mouth. Take 1 tab (5 mg) by mouth every 4 hours prn for mild pain and take 2 tabs (10 mg) by mouth every 4 hours prn for moderate to severe pain 06/12/17  Yes Lorenso QuarryLeach, Shannon, NP  polyethylene glycol [facility](MIRALAX / GLYCOLAX) packet Take 17 g daily by mouth. 06/12/17  Yes Elisabeth PigeonVachhani,  Heath Gold, MD  potassium chloride SA (K-DUR,KLOR-CON) 20 MEQ tablet Take 1 tablet (20 mEq total) daily by mouth. Patient taking differently: Take 10 mEq daily by mouth.  06/11/17  Yes Altamese Dilling, MD  senna (SENOKOT) 8.6 MG tablet Take 1 tablet 2 (two) times daily by mouth.   Yes [provider]  silodosin (RAPAFLO) 8 MG CAPS capsule Take 8 mg daily with breakfast by mouth.   Yes [provider]  spironolactone (ALDACTONE) 25 MG tablet Take 25 mg daily by mouth.   Yes [provider]  torsemide (DEMADEX) 5 MG tablet Take 1 tablet (5 mg total) daily  by mouth. Patient taking differently: Take 5 mg 3 (three) times daily as needed by mouth.  06/11/17  Yes Altamese Dilling, MD  XARELTO 15 MG TABS tablet Take one tablet by mouth daily 04/24/16  Yes [provider]  ACCU-CHEK SOFTCLIX LANCETS lancets U UTD BID 04/01/17   [provider]  acetaminophen (TYLENOL) 325 MG tablet Take 2 tablets (650 mg total) every 4 (four) hours as needed by mouth for mild pain or fever ((score 1 to 3) or temp > 100.5). 06/11/17   Altamese Dilling, MD  tamsulosin (FLOMAX) 0.4 MG CAPS capsule Take 1 capsule (0.4 mg total) daily by mouth. Patient not taking: Reported on 2017-07-14 06/11/17   Altamese Dilling, MD    Allergies: Allergies  Allergen Reactions  . Pioglitazone Swelling  . Penicillins Rash  . Zolpidem Other (See Comments)    confusion    Social History:  reports that he has quit smoking. he has never used smokeless tobacco. He reports that he does not drink alcohol or use drugs.   Family History: Family History  Problem Relation Age of Onset  . Cancer Mother   . Cancer Father   . Cancer Sister     Review of Systems: Review of Systems  Constitutional: Negative for chills and fever.  HENT: Negative for hearing loss.   Respiratory: Positive for wheezing. Negative for shortness of breath.   Cardiovascular: Negative for chest pain.  Gastrointestinal: Positive for constipation. Negative for abdominal pain, nausea and vomiting.  Genitourinary: Negative for dysuria.  Musculoskeletal: Negative for myalgias.  Skin: Negative for rash.  Neurological: Negative for dizziness.  Psychiatric/Behavioral: Negative for depression.  All other systems reviewed and are negative.   Physical Exam BP 140/85   Pulse 89   Temp (!) 97.3 F (36.3 C) (Oral)   Resp 20   Ht 6' (1.829 m)   Wt 99.8 kg (220 lb)   SpO2 95%   BMI 29.84 kg/m  CONSTITUTIONAL: No acute distress HEENT:  Normocephalic, atraumatic, extraocular motion  intact. NECK: Trachea is midline, and there is no jugular venous distension.  RESPIRATORY:  Lungs are clear, and breath sounds are equal bilaterally, except for upper airway wheezing. CARDIOVASCULAR: Heart is regular without murmurs, gallops, or rubs. GI: The abdomen is soft but distended, with no tenderness to palpation. There were no palpable masses.  MUSCULOSKELETAL:  Normal muscle strength and tone in all four extremities.  No peripheral edema or cyanosis. SKIN: Skin turgor is normal. There are no pathologic skin lesions.  NEUROLOGIC:  Motor and sensation is grossly normal.  Cranial nerves are grossly intact. PSYCH:  Alert and oriented to person, place and time. Affect is normal.  Laboratory Analysis: Results for orders placed or performed during the hospital encounter of 14-Jul-2017 (from the past 24 hour(s))  Urinalysis, Complete w Microscopic     Status: Abnormal  Collection Time: 08-21-16  9:30 AM  Result Value Ref Range   Color, Urine AMBER (A) YELLOW   APPearance HAZY (A) CLEAR   Specific Gravity, Urine 1.021 1.005 - 1.030   pH 5.0 5.0 - 8.0   Glucose, UA NEGATIVE NEGATIVE mg/dL   Hgb urine dipstick MODERATE (A) NEGATIVE   Bilirubin Urine NEGATIVE NEGATIVE   Ketones, ur 5 (A) NEGATIVE mg/dL   Protein, ur 30 (A) NEGATIVE mg/dL   Nitrite NEGATIVE NEGATIVE   Leukocytes, UA NEGATIVE NEGATIVE   RBC / HPF 6-30 0 - 5 RBC/hpf   WBC, UA 0-5 0 - 5 WBC/hpf   Bacteria, UA RARE (A) NONE SEEN   Squamous Epithelial / LPF NONE SEEN NONE SEEN   Mucus PRESENT    Hyaline Casts, UA PRESENT   Lipase, blood     Status: Abnormal   Collection Time: 08-21-16  9:40 AM  Result Value Ref Range   Lipase 83 (H) 11 - 51 U/L  Comprehensive metabolic panel     Status: Abnormal   Collection Time: 08-21-16  9:40 AM  Result Value Ref Range   Sodium 135 135 - 145 mmol/L   Potassium 3.5 3.5 - 5.1 mmol/L   Chloride 95 (L) 101 - 111 mmol/L   CO2 26 22 - 32 mmol/L   Glucose, Bld 224 (H) 65 - 99 mg/dL    BUN 24 (H) 6 - 20 mg/dL   Creatinine, Ser 1.611.00 0.61 - 1.24 mg/dL   Calcium 8.6 (L) 8.9 - 10.3 mg/dL   Total Protein 6.0 (L) 6.5 - 8.1 g/dL   Albumin 2.5 (L) 3.5 - 5.0 g/dL   AST 29 15 - 41 U/L   ALT 18 17 - 63 U/L   Alkaline Phosphatase 98 38 - 126 U/L   Total Bilirubin 1.6 (H) 0.3 - 1.2 mg/dL   GFR calc non Af Amer >60 >60 mL/min   GFR calc Af Amer >60 >60 mL/min   Anion gap 14 5 - 15  CBC     Status: Abnormal   Collection Time: 08-21-16  9:40 AM  Result Value Ref Range   WBC 10.3 3.8 - 10.6 K/uL   RBC 3.66 (L) 4.40 - 5.90 MIL/uL   Hemoglobin 11.8 (L) 13.0 - 18.0 g/dL   HCT 09.633.2 (L) 04.540.0 - 40.952.0 %   MCV 90.7 80.0 - 100.0 fL   MCH 32.1 26.0 - 34.0 pg   MCHC 35.4 32.0 - 36.0 g/dL   RDW 81.114.2 91.411.5 - 78.214.5 %   Platelets 351 150 - 440 K/uL  C difficile quick scan w PCR reflex     Status: None   Collection Time: 08-21-16 11:05 AM  Result Value Ref Range   C Diff antigen NEGATIVE NEGATIVE   C Diff toxin NEGATIVE NEGATIVE   C Diff interpretation No C. difficile detected.     Imaging: Ct Abdomen Pelvis W Contrast  Result Date: 26-Jan-2017 CLINICAL DATA:  Abdominal pain, distention EXAM: CT ABDOMEN AND PELVIS WITH CONTRAST TECHNIQUE: Multidetector CT imaging of the abdomen and pelvis was performed using the standard protocol following bolus administration of intravenous contrast. CONTRAST:  100mL ISOVUE-300 IOPAMIDOL (ISOVUE-300) INJECTION 61% COMPARISON:  None. FINDINGS: Lower chest: Cardiomegaly. Pacer wires noted in the right heart. Trace bilateral pleural effusions with bibasilar atelectasis. Hepatobiliary: No focal hepatic abnormality. Gallbladder unremarkable. Pancreas: No focal abnormality or ductal dilatation. Spleen: No focal abnormality.  Normal size. Adrenals/Urinary Tract: No adrenal abnormality. No focal renal abnormality. No stones or hydronephrosis. Urinary  bladder is unremarkable. Foley catheter in place within the bladder which is decompressed. Stomach/Bowel: There is fluid  and gas distention of the small bowel and right colon and transverse colon. Distal small bowel is decompressed without focal abrupt caliber change Vascular/Lymphatic: Aortic and iliac calcifications. No aneurysm. No adenopathy. Reproductive:  No visible focal abnormality. Other: No free fluid or free air. Small bilateral inguinal hernias containing fat. Musculoskeletal: No acute bony abnormality. Prior left hip replacement. IMPRESSION: Dilated small bowel loops with air-fluid levels. The distal small bowel loop is decompressed without abrupt caliber change. Cannot completely exclude distal small bowel obstruction. However, the right colon and transverse colon are also moderately dilated with fluid and gas. Findings could reflect ileus. Trace bilateral pleural effusions, bibasilar atelectasis. Cardiomegaly. Aortic atherosclerosis. Electronically Signed   By: Charlett Nose M.D.   On: 06/20/2017 13:10   Dg Chest Port 1 View  Result Date: 06/15/2017 CLINICAL DATA:  AFib and hypertension EXAM: PORTABLE CHEST 1 VIEW COMPARISON:  06/08/2017 FINDINGS: 1413 hours. The cardio pericardial silhouette is enlarged. The lungs are clear without focal pneumonia, edema, or pneumothorax. Possible tiny left pleural effusion. Old left clavicle fracture noted. Left permanent pacemaker remains in place. Telemetry leads overlie the chest. IMPRESSION: Cardiomegaly with possible tiny left pleural effusion. Electronically Signed   By: Kennith Center M.D.   On: 06/11/2017 14:43    Assessment and Plan: This is a 83 y.o. male who presents with abdominal distention and ileus.  I have independently viewed the patient's imaging studies and reviewed his laboratory studies.  Overall, his CT shows dilated loops of small bowel throughout except at the very distal portion of the terminal ileum.  Also, his colon is mildly dilated throughout.  This correlates better with narcotic induced ileus rather than small bowel obstruction.  Discussed with  the patient that currently there are no acute surgical needs, as ileus is not an indication for surgery.  Would recommend bowel regimen first in the form of enemas and suppositories and as that has some effect can also start po regimen.  His stomach is distended on CT scan so he could become nauseous and have further emesis.  Discussed with patient that if that happens, he would benefit from an NG tube to help decompress and for comfort.  Patient understands this plan and all of his questions have been answered.  Face-to-face time spent with the patient and care providers was 55 minutes, with more than 50% of the time spent counseling, educating, and coordinating care of the patient.     Howie Ill, MD [facility]

## 2017-06-14 NOTE — ED Notes (Signed)
Patient transported to CT at this time. 

## 2017-06-14 NOTE — ED Notes (Signed)
Pt resting in bed, family at bedside, denies any needs, aware of pending admission  

## 2017-06-14 NOTE — Clinical Social Work Note (Signed)
CSW received consult for SNF placement. CSW will follow pending PT recommendations.  Evamarie Raetz Martha Oshen Wlodarczyk, MSW, LCSWA [phone] 

## 2017-06-15 ENCOUNTER — Inpatient Hospital Stay: Payer: Medicare Other

## 2017-06-15 ENCOUNTER — Inpatient Hospital Stay: Admit: 2017-06-15 | Payer: Medicare Other

## 2017-06-15 DIAGNOSIS — J690 Pneumonitis due to inhalation of food and vomit: Secondary | ICD-10-CM

## 2017-06-15 DIAGNOSIS — N179 Acute kidney failure, unspecified: Secondary | ICD-10-CM

## 2017-06-15 DIAGNOSIS — R570 Cardiogenic shock: Secondary | ICD-10-CM

## 2017-06-15 DIAGNOSIS — J9600 Acute respiratory failure, unspecified whether with hypoxia or hypercapnia: Secondary | ICD-10-CM

## 2017-06-15 DIAGNOSIS — A419 Sepsis, unspecified organism: Secondary | ICD-10-CM

## 2017-06-15 DIAGNOSIS — I469 Cardiac arrest, cause unspecified: Secondary | ICD-10-CM

## 2017-06-15 DIAGNOSIS — R6521 Severe sepsis with septic shock: Secondary | ICD-10-CM

## 2017-06-15 LAB — BLOOD GAS, ARTERIAL
Acid-Base Excess: 0.7 mmol/L (ref 0.0–2.0)
BICARBONATE: 25.4 mmol/L (ref 20.0–28.0)
FIO2: 1
MECHVT: 500 mL
O2 SAT: 99.7 %
PATIENT TEMPERATURE: 39.1
PCO2 ART: 40 mmHg (ref 32.0–48.0)
PEEP/CPAP: 5 cmH2O
PH ART: 7.38 (ref 7.350–7.450)
PO2 ART: 212 mmHg — AB (ref 83.0–108.0)
RATE: 18 resp/min

## 2017-06-15 LAB — BLOOD CULTURE ID PANEL (REFLEXED)
ACINETOBACTER BAUMANNII: NOT DETECTED
CANDIDA ALBICANS: NOT DETECTED
CANDIDA GLABRATA: NOT DETECTED
CANDIDA KRUSEI: NOT DETECTED
CANDIDA TROPICALIS: NOT DETECTED
Candida parapsilosis: NOT DETECTED
Carbapenem resistance: NOT DETECTED
ESCHERICHIA COLI: DETECTED — AB
Enterobacter cloacae complex: NOT DETECTED
Enterobacteriaceae species: DETECTED — AB
Enterococcus species: NOT DETECTED
Haemophilus influenzae: NOT DETECTED
KLEBSIELLA OXYTOCA: NOT DETECTED
Klebsiella pneumoniae: NOT DETECTED
Listeria monocytogenes: NOT DETECTED
Neisseria meningitidis: NOT DETECTED
PROTEUS SPECIES: NOT DETECTED
Pseudomonas aeruginosa: NOT DETECTED
SERRATIA MARCESCENS: NOT DETECTED
STAPHYLOCOCCUS SPECIES: NOT DETECTED
STREPTOCOCCUS PNEUMONIAE: NOT DETECTED
Staphylococcus aureus (BCID): NOT DETECTED
Streptococcus agalactiae: NOT DETECTED
Streptococcus pyogenes: NOT DETECTED
Streptococcus species: NOT DETECTED

## 2017-06-15 LAB — COMPREHENSIVE METABOLIC PANEL
ALK PHOS: 79 U/L (ref 38–126)
ALT: 20 U/L (ref 17–63)
AST: 52 U/L — AB (ref 15–41)
Albumin: 2.1 g/dL — ABNORMAL LOW (ref 3.5–5.0)
Anion gap: 19 — ABNORMAL HIGH (ref 5–15)
BILIRUBIN TOTAL: 1.2 mg/dL (ref 0.3–1.2)
BUN: 29 mg/dL — ABNORMAL HIGH (ref 6–20)
CALCIUM: 8.1 mg/dL — AB (ref 8.9–10.3)
CO2: 23 mmol/L (ref 22–32)
Chloride: 94 mmol/L — ABNORMAL LOW (ref 101–111)
Creatinine, Ser: 1.59 mg/dL — ABNORMAL HIGH (ref 0.61–1.24)
GFR calc Af Amer: 45 mL/min — ABNORMAL LOW (ref >60)
GFR, EST NON AFRICAN AMERICAN: 39 mL/min — AB (ref >60)
GLUCOSE: 284 mg/dL — AB (ref 65–99)
Potassium: 3.5 mmol/L (ref 3.5–5.1)
Sodium: 136 mmol/L (ref 135–145)
Total Protein: 5.4 g/dL — ABNORMAL LOW (ref 6.5–8.1)

## 2017-06-15 LAB — PHOSPHORUS: Phosphorus: 7.2 mg/dL — ABNORMAL HIGH (ref 2.5–4.6)

## 2017-06-15 LAB — GLUCOSE, CAPILLARY
GLUCOSE-CAPILLARY: 153 mg/dL — AB (ref 65–99)
GLUCOSE-CAPILLARY: 139 mg/dL — AB (ref 65–99)
Glucose-Capillary: 278 mg/dL — ABNORMAL HIGH (ref 65–99)
Glucose-Capillary: 148 mg/dL — ABNORMAL HIGH (ref 65–99)
Glucose-Capillary: 156 mg/dL — ABNORMAL HIGH (ref 65–99)
Glucose-Capillary: 128 mg/dL — ABNORMAL HIGH (ref 65–99)

## 2017-06-15 LAB — CBC
HEMATOCRIT: 29 % — AB (ref 40.0–52.0)
HEMATOCRIT: 31.2 % — AB (ref 40.0–52.0)
HEMOGLOBIN: 10 g/dL — AB (ref 13.0–18.0)
HEMOGLOBIN: 10.6 g/dL — AB (ref 13.0–18.0)
MCH: 31.8 pg (ref 26.0–34.0)
MCH: 32 pg (ref 26.0–34.0)
MCHC: 34 g/dL (ref 32.0–36.0)
MCHC: 34.5 g/dL (ref 32.0–36.0)
MCV: 92.8 fL (ref 80.0–100.0)
MCV: 93.4 fL (ref 80.0–100.0)
Platelets: 362 10*3/uL (ref 150–440)
Platelets: 381 10*3/uL (ref 150–440)
RBC: 3.13 MIL/uL — AB (ref 4.40–5.90)
RBC: 3.33 MIL/uL — AB (ref 4.40–5.90)
RDW: 14.2 % (ref 11.5–14.5)
RDW: 14.6 % — AB (ref 11.5–14.5)
WBC: 2.4 10*3/uL — ABNORMAL LOW (ref 3.8–10.6)
WBC: 9.1 10*3/uL (ref 3.8–10.6)

## 2017-06-15 LAB — BASIC METABOLIC PANEL
Anion gap: 11 (ref 5–15)
Anion gap: 13 (ref 5–15)
BUN: 31 mg/dL — ABNORMAL HIGH (ref 6–20)
BUN: 37 mg/dL — AB (ref 6–20)
CALCIUM: 6.8 mg/dL — AB (ref 8.9–10.3)
CHLORIDE: 103 mmol/L (ref 101–111)
CO2: 23 mmol/L (ref 22–32)
CO2: 22 mmol/L (ref 22–32)
CREATININE: 2.27 mg/dL — AB (ref 0.61–1.24)
Calcium: 6.6 mg/dL — ABNORMAL LOW (ref 8.9–10.3)
Chloride: 103 mmol/L (ref 101–111)
Creatinine, Ser: 1.74 mg/dL — ABNORMAL HIGH (ref 0.61–1.24)
GFR calc Af Amer: 29 mL/min — ABNORMAL LOW (ref >60)
GFR calc non Af Amer: 35 mL/min — ABNORMAL LOW (ref >60)
GFR, EST AFRICAN AMERICAN: 40 mL/min — AB (ref >60)
GFR, EST NON AFRICAN AMERICAN: 25 mL/min — AB (ref >60)
GLUCOSE: 161 mg/dL — AB (ref 65–99)
Glucose, Bld: 140 mg/dL — ABNORMAL HIGH (ref 65–99)
POTASSIUM: 2.8 mmol/L — AB (ref 3.5–5.1)
Potassium: 4 mmol/L (ref 3.5–5.1)
SODIUM: 137 mmol/L (ref 135–145)
Sodium: 138 mmol/L (ref 135–145)

## 2017-06-15 LAB — BRAIN NATRIURETIC PEPTIDE: B Natriuretic Peptide: 481 pg/mL — ABNORMAL HIGH (ref 0.0–100.0)

## 2017-06-15 LAB — LACTIC ACID, PLASMA
LACTIC ACID, VENOUS: 7.8 mmol/L — AB (ref 0.5–1.9)
Lactic Acid, Venous: 9 mmol/L (ref 0.5–1.9)

## 2017-06-15 LAB — AMMONIA: AMMONIA: 20 umol/L (ref 9–35)

## 2017-06-15 LAB — TROPONIN I
TROPONIN I: 0.39 ng/mL — AB (ref <0.03)
TROPONIN I: 0.31 ng/mL — AB (ref <0.03)
Troponin I: 0.03 ng/mL (ref <0.03)

## 2017-06-15 LAB — MAGNESIUM: Magnesium: 2.4 mg/dL (ref 1.7–2.4)

## 2017-06-15 LAB — PROCALCITONIN
PROCALCITONIN: 28.82 ng/mL
Procalcitonin: 0.47 ng/mL

## 2017-06-15 MED ORDER — VASOPRESSIN 20 UNIT/ML IV SOLN
0.0300 [IU]/min | INTRAVENOUS | Status: DC
  Administered 2017-06-15 – 2017-06-17 (×3): 0.03 [IU]/min via INTRAVENOUS
  Filled 2017-06-15 (×3): qty 2

## 2017-06-15 MED ORDER — AMIODARONE IV BOLUS ONLY 150 MG/100ML
INTRAVENOUS | Status: AC
  Administered 2017-06-15: 150 mg
  Filled 2017-06-15: qty 100

## 2017-06-15 MED ORDER — ARTIFICIAL TEARS OPHTHALMIC OINT
TOPICAL_OINTMENT | Freq: Three times a day (TID) | OPHTHALMIC | Status: DC
  Administered 2017-06-15 (×2): via OPHTHALMIC
  Administered 2017-06-16 (×2): 1 via OPHTHALMIC
  Administered 2017-06-16 – 2017-06-17 (×2): via OPHTHALMIC
  Filled 2017-06-15: qty 3.5

## 2017-06-15 MED ORDER — STERILE WATER FOR INJECTION IJ SOLN
INTRAMUSCULAR | Status: AC
  Filled 2017-06-15: qty 10

## 2017-06-15 MED ORDER — SODIUM CHLORIDE 0.9 % IV BOLUS (SEPSIS)
1000.0000 mL | INTRAVENOUS | Status: AC
  Administered 2017-06-15 (×2): 1000 mL via INTRAVENOUS

## 2017-06-15 MED ORDER — AMIODARONE LOAD VIA INFUSION
150.0000 mg | Freq: Once | INTRAVENOUS | Status: DC
  Filled 2017-06-15: qty 83.34

## 2017-06-15 MED ORDER — SODIUM CHLORIDE 0.9 % IV SOLN
0.0000 ug/min | INTRAVENOUS | Status: DC
  Administered 2017-06-15 (×2): 400 ug/min via INTRAVENOUS
  Administered 2017-06-15: 350 ug/min via INTRAVENOUS
  Administered 2017-06-15: 290 ug/min via INTRAVENOUS
  Administered 2017-06-15: 330 ug/min via INTRAVENOUS
  Administered 2017-06-15 (×5): 400 ug/min via INTRAVENOUS
  Administered 2017-06-16: 250 ug/min via INTRAVENOUS
  Administered 2017-06-16: 225 ug/min via INTRAVENOUS
  Administered 2017-06-16: 275 ug/min via INTRAVENOUS
  Administered 2017-06-16: 400 ug/min via INTRAVENOUS
  Administered 2017-06-16: 300 ug/min via INTRAVENOUS
  Administered 2017-06-16 (×2): 250 ug/min via INTRAVENOUS
  Administered 2017-06-16: 300 ug/min via INTRAVENOUS
  Administered 2017-06-16: 225 ug/min via INTRAVENOUS
  Administered 2017-06-16: 400 ug/min via INTRAVENOUS
  Administered 2017-06-16: 300 ug/min via INTRAVENOUS
  Administered 2017-06-16: 150 ug/min via INTRAVENOUS
  Administered 2017-06-16 (×2): 400 ug/min via INTRAVENOUS
  Administered 2017-06-16 – 2017-06-17 (×3): 300 ug/min via INTRAVENOUS
  Filled 2017-06-15 (×2): qty 40
  Filled 2017-06-15: qty 4
  Filled 2017-06-15: qty 40
  Filled 2017-06-15: qty 4
  Filled 2017-06-15 (×2): qty 40
  Filled 2017-06-15: qty 4
  Filled 2017-06-15: qty 40
  Filled 2017-06-15 (×4): qty 4
  Filled 2017-06-15: qty 40
  Filled 2017-06-15: qty 4
  Filled 2017-06-15: qty 40
  Filled 2017-06-15 (×2): qty 4
  Filled 2017-06-15 (×6): qty 40

## 2017-06-15 MED ORDER — HYDROCORTISONE NA SUCCINATE PF 250 MG IJ SOLR: 200.0000 mg | Freq: Once | INTRAMUSCULAR | Status: DC

## 2017-06-15 MED ORDER — PHENYLEPHRINE HCL 10 MG/ML IJ SOLN
0.0000 ug/min | INTRAMUSCULAR | Status: DC
  Administered 2017-06-15: 40 ug/min via INTRAVENOUS
  Administered 2017-06-15: 400 ug/min via INTRAVENOUS
  Filled 2017-06-15 (×2): qty 1
  Filled 2017-06-15: qty 10
  Filled 2017-06-15 (×4): qty 1

## 2017-06-15 MED ORDER — AMIODARONE IV BOLUS ONLY 150 MG/100ML
150.0000 mg | Freq: Once | INTRAVENOUS | Status: AC
  Administered 2017-06-15: 150 mg via INTRAVENOUS
  Filled 2017-06-15: qty 100

## 2017-06-15 MED ORDER — ACETAMINOPHEN 160 MG/5ML PO SOLN
650.0000 mg | Freq: Four times a day (QID) | ORAL | Status: DC | PRN
Start: 2017-06-15 — End: 2017-06-17

## 2017-06-15 MED ORDER — HYDROCORTISONE SOD SUCCINATE 100 MG IJ SOLR
200.0000 mg | Freq: Once | INTRAMUSCULAR | Status: AC
  Administered 2017-06-15: 200 mg via INTRAVENOUS
  Filled 2017-06-15 (×2): qty 4

## 2017-06-15 MED ORDER — AMIODARONE HCL IN DEXTROSE 360-4.14 MG/200ML-% IV SOLN
60.0000 mg/h | INTRAVENOUS | Status: AC
  Administered 2017-06-15 (×2): 60 mg/h via INTRAVENOUS
  Filled 2017-06-15 (×2): qty 200

## 2017-06-15 MED ORDER — ATORVASTATIN CALCIUM 20 MG PO TABS: 40.0000 mg | ORAL_TABLET | Freq: Every day | ORAL | Status: DC

## 2017-06-15 MED ORDER — ONDANSETRON HCL 4 MG PO TABS: 4.0000 mg | ORAL_TABLET | Freq: Four times a day (QID) | ORAL | Status: DC | PRN

## 2017-06-15 MED ORDER — MIDAZOLAM HCL 2 MG/2ML IJ SOLN
INTRAMUSCULAR | Status: AC
Start: 2017-06-15 — End: 2017-06-15
  Administered 2017-06-15: 2 mg
  Filled 2017-06-15: qty 2

## 2017-06-15 MED ORDER — RIVAROXABAN 15 MG PO TABS: 15.0000 mg | ORAL_TABLET | Freq: Every day | ORAL | Status: DC

## 2017-06-15 MED ORDER — VECURONIUM BROMIDE 10 MG IV SOLR
INTRAVENOUS | Status: AC
  Filled 2017-06-15: qty 10

## 2017-06-15 MED ORDER — NOREPINEPHRINE BITARTRATE 1 MG/ML IV SOLN
0.0000 ug/min | INTRAVENOUS | Status: DC
  Administered 2017-06-15 – 2017-06-17 (×6): 40 ug/min via INTRAVENOUS
  Filled 2017-06-15 (×8): qty 16

## 2017-06-15 MED ORDER — NOREPINEPHRINE BITARTRATE 1 MG/ML IV SOLN
0.0000 ug/min | INTRAVENOUS | Status: DC
  Administered 2017-06-15: 30 ug/min via INTRAVENOUS
  Filled 2017-06-15: qty 4

## 2017-06-15 MED ORDER — ACETAMINOPHEN 650 MG RE SUPP
650.0000 mg | Freq: Four times a day (QID) | RECTAL | Status: DC | PRN
  Administered 2017-06-15: 650 mg via RECTAL
  Filled 2017-06-15 (×3): qty 1

## 2017-06-15 MED ORDER — VALPROATE SODIUM 500 MG/5ML IV SOLN
500.0000 mg | Freq: Two times a day (BID) | INTRAVENOUS | Status: DC
  Administered 2017-06-15 (×2): 500 mg via INTRAVENOUS
  Filled 2017-06-15 (×4): qty 5

## 2017-06-15 MED ORDER — PHENYLEPHRINE HCL 10 MG/ML IJ SOLN
0.0000 ug/min | Freq: Once | INTRAMUSCULAR | Status: DC
  Filled 2017-06-15: qty 1

## 2017-06-15 MED ORDER — HEPARIN SODIUM (PORCINE) 5000 UNIT/ML IJ SOLN
5000.0000 [IU] | Freq: Three times a day (TID) | INTRAMUSCULAR | Status: DC
  Administered 2017-06-15 – 2017-06-17 (×7): 5000 [IU] via SUBCUTANEOUS
  Filled 2017-06-15 (×7): qty 1

## 2017-06-15 MED ORDER — SODIUM CHLORIDE 0.9 % IV SOLN
1.0000 g | Freq: Three times a day (TID) | INTRAVENOUS | Status: DC
  Administered 2017-06-15 – 2017-06-16 (×3): 1 g via INTRAVENOUS
  Filled 2017-06-15 (×6): qty 1

## 2017-06-15 MED ORDER — ONDANSETRON HCL 4 MG/2ML IJ SOLN: 4.0000 mg | Freq: Four times a day (QID) | INTRAMUSCULAR | Status: DC | PRN

## 2017-06-15 MED ORDER — SODIUM CHLORIDE 0.9 % IV BOLUS (SEPSIS)
1000.0000 mL | Freq: Once | INTRAVENOUS | Status: AC
  Administered 2017-06-15: 1000 mL via INTRAVENOUS

## 2017-06-15 MED ORDER — LORAZEPAM 2 MG/ML IJ SOLN
2.0000 mg | INTRAMUSCULAR | Status: DC | PRN
  Administered 2017-06-15 (×2): 2 mg via INTRAVENOUS
  Filled 2017-06-15 (×2): qty 1

## 2017-06-15 MED ORDER — METOPROLOL TARTRATE 25 MG PO TABS: 25.0000 mg | ORAL_TABLET | Freq: Two times a day (BID) | ORAL | Status: DC

## 2017-06-15 MED ORDER — PHENYLEPHRINE HCL 10 MG/ML IJ SOLN
0.0000 ug/min | Freq: Once | INTRAMUSCULAR | Status: AC
  Administered 2017-06-15: 30 ug/min via INTRAVENOUS
  Filled 2017-06-15: qty 10

## 2017-06-15 MED ORDER — VANCOMYCIN HCL IN DEXTROSE 1-5 GM/200ML-% IV SOLN
1000.0000 mg | Freq: Two times a day (BID) | INTRAVENOUS | Status: DC
  Administered 2017-06-15: 1000 mg via INTRAVENOUS
  Filled 2017-06-15 (×3): qty 200

## 2017-06-15 MED ORDER — AMIODARONE HCL IN DEXTROSE 360-4.14 MG/200ML-% IV SOLN
30.0000 mg/h | INTRAVENOUS | Status: DC
  Administered 2017-06-16: 30 mg/h via INTRAVENOUS
  Administered 2017-06-16 (×2): 60 mg/h via INTRAVENOUS
  Administered 2017-06-17: 30 mg/h via INTRAVENOUS
  Filled 2017-06-15 (×3): qty 200

## 2017-06-15 MED ORDER — MORPHINE SULFATE (PF) 2 MG/ML IV SOLN: 2.0000 mg | INTRAVENOUS | Status: DC | PRN

## 2017-06-15 MED ORDER — POTASSIUM CHLORIDE 10 MEQ/50ML IV SOLN
10.0000 meq | INTRAVENOUS | Status: AC
  Administered 2017-06-15 (×6): 10 meq via INTRAVENOUS
  Filled 2017-06-15 (×6): qty 50

## 2017-06-15 NOTE — Progress Notes (Signed)
RN made Dr. Sung AmabileSimonds aware that patient's heart rate has been staying in 130's more consistently and fluctuating into 140-150's afib.  MD gave order for amio bolus.

## 2017-06-15 NOTE — Procedures (Signed)
Arterial Catheter Insertion Procedure Note Harold EisenmengerClarence F Cervantes [id number] [birth year]  Procedure: Insertion of Arterial Catheter  Indications: Blood pressure monitoring and Frequent blood sampling  Procedure Details Consent: Unable to obtain consent because of emergent medical necessity. Time Out: Verified patient identification, verified procedure, site/side was marked, verified correct patient position, special equipment/implants available, medications/allergies/relevent history reviewed, required imaging and test results available.  Performed  Maximum sterile technique was used including antiseptics, cap, gloves, gown, hand hygiene, mask and sheet. Skin prep: Chlorhexidine; local anesthetic administered 20 gauge catheter was inserted into right femoral artery using the Seldinger technique.  Evaluation Blood flow good; BP tracing good. Complications: No apparent complications.  Procedure performed under direct supervision of Dr.Simonds. Ultrasound utilized for realtime vessel cannulation  Harold Cervantes ANP-BC Pulmonary and Critical Care Medicine [facility] Pager [phone] or [phone] 06/15/2017

## 2017-06-15 NOTE — Progress Notes (Signed)
Chaplain responded to an overhead page for code blue for pt in Rm211. Medical team [facility]. [facility] un[facility], active CPR in progress, pt was resuscitated and transferred to ICU10. No family member was present at the time. Nurse called pt's wife, who said she was on the way to hospital.  When pt's wife  arrived, [facility] escorted her to ICU, staying with her in family waiting Rm until lab and other groups of caregiver were out of the Rm, then escorted her to pt's Rm. CH provided coffee and pastoral support and presence to wife.   06/15/17 0000  Clinical Encounter Type  Visited With Patient;Patient and family together;Health care provider  Visit Type Initial;Follow-up;Code;Other (Comment)  Referral From Nurse  Consult/Referral To Chaplain  Spiritual Encounters  Spiritual Needs Emotional;Other (Comment)

## 2017-06-15 NOTE — Progress Notes (Addendum)
Dr Anne HahnWillis notified Pt confused, O2 sats low 80's, pt placed on non rebreather, sats88-90's. Nursing supervisor in to see pt.Orders given.

## 2017-06-15 NOTE — Progress Notes (Signed)
Pt vomited large amount brown , black fluid tthrough mouth and nose, became unresposive, code blue called.See code blue sheet.

## 2017-06-15 NOTE — Progress Notes (Signed)
PULMONARY / CRITICAL CARE MEDICINE   Name: Harold EisenmengerClarence F Mullally MRN: [id number] DOB: [birth year]    ADMISSION DATE:  08-01-16  PT PROFILE:   [age] M with recent L THR admitted 11/18 with abdominal pain due to opioid induced obstipation/ileus. Initially admitted to med-surg floor with cardiac monitoring. Developed N/V, then progressive hypoxemia and cardiac arrest. Underwent 10-15 mins ACLS before ROSC. Intubated and transferred to ICU  MAJOR EVENTS/TEST RESULTS: 11/18 admission as above 11/18 CTAP: Dilated small bowel loops with air-fluid levels. The distal small bowel loop is decompressed without abrupt caliber change. Cannot completely exclude distal small bowel obstruction. However, the right colon and transverse colon are also moderately dilated with fluid and gas. Findings could reflect ileus 11/18 Gen Surgery consultation: No indication for surgery.  Bowel regimen recommended including enemas and suppositories. 11/18 N/V, likely aspiration event, cardiac arrest, ACLS X 10-15 minutes, intubated and transferred to ICU requiring vasopressors 11/19 Moderate myoclonus > attenuated with valproic acid. Max dose vasopressors. Oliguria. Goals of care discussed with family.  Likely poor prognosis conveyed.  DNR established.  Full aggressive support short of ACLS to continue.  No dialysis to be initiated unless improving on all other fronts.   INDWELLING DEVICES:: ETT 11/18 >>  L IJ CVL 11/19 >>  R femoral A-line 11/19 >>  MICRO DATA: C diff 11/18 >> NEG Urine 11/19 >>  Resp 11/19 >>  Blood 11/19 >> E. coli  ANTIMICROBIALS:  Vancomycin 11/18 >> 11/19 Pip-tazo 11/18 >>    SUBJECTIVE:  Intubated. Fully unresponsive off of all sedation.  Mild clonus earlier today - Improved after valproic acid. Maximum dose of vasopressors  VITAL SIGNS: BP (!) 79/40   Pulse (!) 160   Temp (!) 103.1 F (39.5 C) (Axillary)   Resp (!) 28   Ht 6' (1.829 m)   Wt 99.8 kg (220 lb)   SpO2 95%   BMI 29.84  kg/m    HEMODYNAMICS: CVP:  [12 mmHg-19 mmHg] 17 mmHg  VENTILATOR SETTINGS: Vent Mode: PRVC FiO2 (%):  [40 %-100 %] 40 % Set Rate:  [18 bmp] 18 bmp Vt Set:  [500 mL] 500 mL PEEP:  [5 cmH20] 5 cmH20 Plateau Pressure:  [15 cmH20-16 cmH20] 15 cmH20  INTAKE / OUTPUT: I/O last 3 completed shifts: In: 8659.8 [I.V.:7959.8; IV Piggyback:700] Out: 1555 [Urine:480; Emesis/NG output:1075]  PHYSICAL EXAMINATION: General: intubated, unresponsive Neuro: comatose, eyes deviated upward, no withdrawal HEENT: NCAT, sclerae white Cardiovascular: IRIR, tachy, no M noted Lungs: no wheezes Abdomen: Distended, tympanitic, diminished BS Ext: BLE wrapped, chronic stasis changes  LABS:  BMET Recent Labs  Lab 06/15/17 0001 06/15/17 0431 06/15/17 1640  NA 136 137 138  K 3.5 2.8* 4.0  CL 94* 103 103  CO2 23 23 22   BUN 29* 31* 37*  CREATININE 1.59* 1.74* 2.27*  GLUCOSE 284* 140* 161*    Electrolytes Recent Labs  Lab 06/11/17 0322  06/15/17 0001 06/15/17 0003 06/15/17 0431 06/15/17 1640  CALCIUM 7.4*   < > 8.1*  --  6.6* 6.8*  MG 2.0  --   --  2.4  --   --   PHOS  --   --   --  7.2*  --   --    < > = values in this interval not displayed.    CBC Recent Labs  Lab 04-09-17 0940 06/15/17 0001 06/15/17 0431  WBC 10.3 9.1 2.4*  HGB 11.8* 10.6* 10.0*  HCT 33.2* 31.2* 29.0*  PLT 351 381 362  Coag's No results for input(s): APTT, INR in the last 168 hours.  Sepsis Markers Recent Labs  Lab 06/15/17 0001 06/15/17 0003 06/15/17 0431  LATICACIDVEN 9.0*  --  7.8*  PROCALCITON  --  0.47 28.82    ABG Recent Labs  Lab 06/15/17 0015  PHART 7.38  PCO2ART 40  PO2ART 212*    Liver Enzymes Recent Labs  Lab March 14, 2017 0940 06/15/17 0001  AST 29 52*  ALT 18 20  ALKPHOS 98 79  BILITOT 1.6* 1.2  ALBUMIN 2.5* 2.1*    Cardiac Enzymes Recent Labs  Lab 06/15/17 0001 06/15/17 0431 06/15/17 1142  TROPONINI 0.03* 0.39* 0.31*    Glucose Recent Labs  Lab  06/11/17 0754 06/11/17 1212 March 14, 2017 2306 06/15/17 0751 06/15/17 1154 06/15/17 1655  GLUCAP 197* 218* 278* 148* 156* 153*    CXR: Cardiomegaly, bilateral basilar atelectasis/infiltrate    ASSESSMENT / PLAN:  PULMONARY A: Acute respiratory failure with hypoxemia Aspiration pneumonia/pneumonitis P:   Cont full vent support - settings reviewed and/or adjusted Cont vent bundle Daily SBT if/when meets criteria   CARDIOVASCULAR A:  Chronic atrial fibrillation with RVR S/P cardiac arrest - Likely due to aspiration event Cardiogenic shock Septic shock Presently on 3 vasopressors P:  Continue vasopressors with MAP goal > 65 mmHg Amiodarone bolus and infusion initiated 11/19  RENAL A:   Oliguria Likely AKI/ATN Not presently a candidate for CRRT P:   Monitor BMET intermittently Monitor I/Os Correct electrolytes as indicated  If he is improving on all other fronts, we can consider nephrology consultation for CRRT 11/20  GASTROINTESTINAL A:   Opioid induced ileus Nausea/vomiting Abdominal distention P:   SUP: IV pantoprazole No nutrition for now - Reassess daily  HEMATOLOGIC A:   Leukopenia - Likely due to severe sepsis P:  DVT px: SQ heparin.  Monitor CBC intermittently Transfuse per usual guidelines  Holding full anticoagulation for now (was on rivaroxaban)  INFECTIOUS A:   Severe sepsis Aspiration pneumonia E. coli bacteremia P:   Monitor temp, WBC count Micro and abx as above   ENDOCRINE A:   DM II, controlled P:   Continue SSI protocol, moderate scale  NEUROLOGIC A:   Post anoxic encephalopathy, appears severe Severe myoclonus -improved after valproic acid P:   RASS goal: -1, -2 Minimize sedation Consider further evaluation with CTH, EEG as indicated Consider neurology consultation as indicated    FAMILY: Multiple family members including wife, sons, daughter were updated on several occasions throughout the day.  Poor prognosis was  conveyed to them although it was acknowledged that it is still early to be certain about prognostication.  We agreed to continued aggressive support but no further ACLS.  We agreed to reassess daily regarding appropriateness of dialysis if indicated.  With they do not wish to proceed with dialysis unless he is improving on multiple other fronts.    CCM time: 75 mins The above time includes time spent in consultation with patient and/or family members and reviewing care plan on multidisciplinary rounds  Billy Fischeravid Serah Nicoletti, MD PCCM service Mobile [phone] Pager [phone]    06/15/2017, 7:38 PM

## 2017-06-15 NOTE — Progress Notes (Signed)
PT Cancellation Note  Patient Details Name: Harold Cervantes MRN: [id number] DOB: [birth year]   Cancelled Treatment:    Reason Eval/Treat Not Completed: Patient not medically ready.  Pt transferred to CCU 06/25/2017 (d/t later in evening code blue called; CPR performed and pt intubated).  D/t pt transferring to higher level of care, per PT protocol require new PT consult in order to continue therapy (will discontinue current PT order d/t this).  Please re-consult PT when pt is medically appropriate to participate in PT.  Hendricks LimesEmily Khalifa Knecht, PT 06/15/17, 8:22 AM [phone]

## 2017-06-15 NOTE — Progress Notes (Signed)
PHARMACY - PHYSICIAN COMMUNICATION CRITICAL VALUE ALERT - BLOOD CULTURE IDENTIFICATION (BCID)  Results for orders placed or performed during the hospital encounter of 06/01/2017  Blood Culture ID Panel (Reflexed) (Collected: 06/15/2017 12:02 AM)  Result Value Ref Range   Enterococcus species NOT DETECTED NOT DETECTED   Listeria monocytogenes NOT DETECTED NOT DETECTED   Staphylococcus species NOT DETECTED NOT DETECTED   Staphylococcus aureus NOT DETECTED NOT DETECTED   Streptococcus species NOT DETECTED NOT DETECTED   Streptococcus agalactiae NOT DETECTED NOT DETECTED   Streptococcus pneumoniae NOT DETECTED NOT DETECTED   Streptococcus pyogenes NOT DETECTED NOT DETECTED   Acinetobacter baumannii NOT DETECTED NOT DETECTED   Enterobacteriaceae species DETECTED (A) NOT DETECTED   Enterobacter cloacae complex NOT DETECTED NOT DETECTED   Escherichia coli DETECTED (A) NOT DETECTED   Klebsiella oxytoca NOT DETECTED NOT DETECTED   Klebsiella pneumoniae NOT DETECTED NOT DETECTED   Proteus species NOT DETECTED NOT DETECTED   Serratia marcescens NOT DETECTED NOT DETECTED   Carbapenem resistance NOT DETECTED NOT DETECTED   Haemophilus influenzae NOT DETECTED NOT DETECTED   Neisseria meningitidis NOT DETECTED NOT DETECTED   Pseudomonas aeruginosa NOT DETECTED NOT DETECTED   Candida albicans NOT DETECTED NOT DETECTED   Candida glabrata NOT DETECTED NOT DETECTED   Candida krusei NOT DETECTED NOT DETECTED   Candida parapsilosis NOT DETECTED NOT DETECTED   Candida tropicalis NOT DETECTED NOT DETECTED    Name of physician (or Provider) Contacted: Sonda Rumbleana Blakeney   Changes to prescribed antibiotics required: Yes, will d/c zosyn and start pt on meropenem .   Jontez Redfield D 06/15/2017  9:34 PM

## 2017-06-15 NOTE — Progress Notes (Signed)
At beginning of shift pt oral and axilary temp. 103.3 after tylenol charted given by previous shift. Ice packs applied, room temp turned down, and verbal order for cooling blanket obtained from NP. Cooling blanket applied and rectal probe. Temp reading 104.5, NP aware. Temp still steadily decreasing. Will continue to monitor.

## 2017-06-15 NOTE — Progress Notes (Signed)
RN made Dr. Sung AmabileSimonds aware that amio bolus helped decrease heart briefly but that heart rate remains elevated 130's-150's afib.  MD gave order for amio bolus.

## 2017-06-15 NOTE — Progress Notes (Signed)
Initial Nutrition Assessment  DOCUMENTATION CODES:   Not applicable  INTERVENTION:  Patient not stable enough for enteral nutrition at this time.  Once patient is hemodynamically stable can consider initiation of trickle feeds if ileus has resolved. Would recommend Vital AF 1.2 at 20 mL/hr. If tube feeds are initiated and patient able to begin advancing towards goal rate, it would be Vital AF 1.2 at 65 mL/hr + Pro-Stat 30 mL once daily (1560 mL goal daily volume, 1972 kcal, 132 grams of protein, 1264 mL H2O daily).  NUTRITION DIAGNOSIS:   Inadequate oral intake related to inability to eat as evidenced by NPO status(currently intubated).  GOAL:   Provide needs based on ASPEN/SCCM guidelines  MONITOR:   Vent status, Labs, Weight trends, TF tolerance, Skin, I & O's  REASON FOR ASSESSMENT:   Ventilator    ASSESSMENT:   83 year old male with PMHx of DM type 2, HTN, A-fib, BPH, hx left toe amputation, hx hip arthroplasty 11/12/218 who presented with abdominal pain and distention found to have  Narcotic induced ileus. On evening of 11/18 patient had an episode of vomiting and suspected aspiration, then suffered cardiopulmonary arrest requiring ACLS for approximately 10 minutes before ROSC was achieved. Patient was also intubated.   No family members at bedside during RD assessment. Patient having post-resuscitation myoclonus. Per weight history in chart patient appears to be weight stable. Abdomen distended on exam.  Access: 14 Fr OGT placed 06/06/2017; tip and side port overlying stomach per abdominal x-ray yesterday; 62 cm at corner of mouth; on LIS  MAP: 54-73 mmHg (Arterial Line)  Patient is currently intubated on ventilator support MV: 7 L/min Temp (24hrs), Avg:99.5 F (37.5 C), Min:97.7 F (36.5 C), Max:100.5 F (38.1 C)  Propofol: N/A  Medications reviewed and include: Dulcolax, Novolog 0-15 units Q4hrs, pantoprazole, Levophed gtt (at max dose 37.5 mL/hr), phenylephrine  gtt (was at max dose of 150 mL/hr earlier this AM), Zosyn, potassium chloride 10 mEq IV 6 times today, vasopressin gtt (11.3 mL/hr).  Labs reviewed: CBG 148-156, BNP 481, Potassium 2.8, BUN 31, Creatinine 1.74.  I/O: 410 UOP yesterday; 1050 mL output from OGT so far this shift  Patient does not meet criteria for malnutrition at this time.  Discussed with RN. Patient had large liquid BM overnight.  NUTRITION - FOCUSED PHYSICAL EXAM:    Most Recent Value  Orbital Region  No depletion  Upper Arm Region  No depletion  Thoracic and Lumbar Region  No depletion  Buccal Region  Unable to assess  Temple Region  No depletion  Clavicle Bone Region  No depletion  Clavicle and Acromion Bone Region  No depletion  Scapular Bone Region  Unable to assess  Dorsal Hand  No depletion  Patellar Region  No depletion  Anterior Thigh Region  No depletion  Posterior Calf Region  No depletion  Edema (RD Assessment)  Moderate [to bilateral upper and lower extremities]  Hair  Reviewed  Eyes  Unable to assess  Mouth  Unable to assess  Skin  Reviewed  Nails  Reviewed     Diet Order:  Diet NPO time specified  EDUCATION NEEDS:   No education needs have been identified at this time  Skin:  Skin Assessment: Skin Integrity Issues: Skin Integrity Issues:: Incisions, Other (Comment) Incisions: closed incision left hip Other: DTI bilateral buttocks; healed amputation of left great toe  Last BM:  06/09/2017 (bowel characteristics not documented, but per rounds large liquid BM)  Height:   Ht  Readings from Last 1 Encounters:  Apr 15, 2017 6' (1.829 m)    Weight:   Wt Readings from Last 1 Encounters:  Apr 15, 2017 220 lb (99.8 kg)    Ideal Body Weight:  80.9 kg  BMI:  Body mass index is 29.84 kg/m.  Estimated Nutritional Needs:   Kcal:  2040 (PSU 2003b w/ MSJ 1742, Ve 7, Tmax 38.1)  Protein:  120-150 grams (1.2-1.5 grams/kg)  Fluid:  2-2.4 L/day (25-30 mL/kg IBW)  Helane RimaLeanne Trevino Wyatt, MS, RD,  LDN Office: [phone] Pager: [phone] After Hours/Weekend Pager: [phone]

## 2017-06-15 NOTE — Progress Notes (Signed)
ANTIBIOTIC CONSULT NOTE - INITIAL  Pharmacy Consult for Meropenem Indication: bacteremia  Allergies  Allergen Reactions  . Pioglitazone Swelling  . Penicillins Rash  . Zolpidem Other (See Comments)    confusion    Patient Measurements: Height: 6' (182.9 cm) Weight: 220 lb (99.8 kg) IBW/kg (Calculated) : 77.6 Adjusted Body Weight:   Vital Signs: Temp: 103.3 F (39.6 C) (11/19 1922) Temp Source: Oral (11/19 1922) BP: 79/40 (11/19 1600) Pulse Rate: 160 (11/19 1915) Intake/Output from previous day: 11/18 0701 - 11/19 0700 In: 5494 [I.V.:5394; IV Piggyback:100] Out: 410 [Urine:410] Intake/Output from this shift: No intake/output data recorded.  Labs: Recent Labs    06/19/2017 0940 06/15/17 0001 06/15/17 0431 06/15/17 1640  WBC 10.3 9.1 2.4*  --   HGB 11.8* 10.6* 10.0*  --   PLT 351 381 362  --   CREATININE 1.00 1.59* 1.74* 2.27*   Estimated Creatinine Clearance: 30.7 mL/min (A) (by C-G formula based on SCr of 2.27 mg/dL (H)). No results for input(s): VANCOTROUGH, VANCOPEAK, VANCORANDOM, GENTTROUGH, GENTPEAK, GENTRANDOM, TOBRATROUGH, TOBRAPEAK, TOBRARND, AMIKACINPEAK, AMIKACINTROU, AMIKACIN in the last 72 hours.   Microbiology: Recent Results (from the past 720 hour(s))  Surgical PCR screen     Status: Abnormal   Collection Time: 06/08/17  8:08 AM  Result Value Ref Range Status   MRSA, PCR POSITIVE (A) NEGATIVE Final    Comment: RESULT CALLED TO, READ BACK BY AND VERIFIED WITH: Shelbie AmmonsKorie Hudson @ [phone] 06/08/17 [facility]    Staphylococcus aureus POSITIVE (A) NEGATIVE Final    Comment: (NOTE) The Xpert SA Assay (FDA approved for NASAL specimens in patients 722 years of age and older), is one component of a comprehensive surveillance program. It is not intended to diagnose infection nor to guide or monitor treatment.   C difficile quick scan w PCR reflex     Status: None   Collection Time: 06/19/2017 11:05 AM  Result Value Ref Range Status   C Diff antigen NEGATIVE NEGATIVE  Final   C Diff toxin NEGATIVE NEGATIVE Final   C Diff interpretation No C. difficile detected.  Final  Culture, blood (Routine X 2) w Reflex to ID Panel     Status: None (Preliminary result)   Collection Time: 06/15/17 12:02 AM  Result Value Ref Range Status   Specimen Description BLOOD RIGHT ANTECUBITAL  Final   Special Requests   Final    BOTTLES DRAWN AEROBIC AND ANAEROBIC Blood Culture adequate volume   Culture  Setup Time   Final    Organism ID to follow AEROBIC BOTTLE ONLY GRAM NEGATIVE RODS CRITICAL RESULT CALLED TO, READ BACK BY AND VERIFIED WITH: Honey Zakarian ON 06/15/17 AT 1616 [facility]    Culture GRAM NEGATIVE RODS  Final   Report Status PENDING  Incomplete  Culture, blood (Routine X 2) w Reflex to ID Panel     Status: None (Preliminary result)   Collection Time: 06/15/17 12:02 AM  Result Value Ref Range Status   Specimen Description BLOOD RIGHT HAND  Final   Special Requests   Final    BOTTLES DRAWN AEROBIC AND ANAEROBIC Blood Culture adequate volume   Culture NO GROWTH < 12 HOURS  Final   Report Status PENDING  Incomplete  Blood Culture ID Panel (Reflexed)     Status: Abnormal   Collection Time: 06/15/17 12:02 AM  Result Value Ref Range Status   Enterococcus species NOT DETECTED NOT DETECTED Final   Listeria monocytogenes NOT DETECTED NOT DETECTED Final   Staphylococcus species NOT DETECTED  NOT DETECTED Final   Staphylococcus aureus NOT DETECTED NOT DETECTED Final   Streptococcus species NOT DETECTED NOT DETECTED Final   Streptococcus agalactiae NOT DETECTED NOT DETECTED Final   Streptococcus pneumoniae NOT DETECTED NOT DETECTED Final   Streptococcus pyogenes NOT DETECTED NOT DETECTED Final   Acinetobacter baumannii NOT DETECTED NOT DETECTED Final   Enterobacteriaceae species DETECTED (A) NOT DETECTED Final    Comment: Enterobacteriaceae represent a large family of gram-negative bacteria, not a single organism. CRITICAL RESULT CALLED TO, READ BACK BY AND VERIFIED  WITH: Camaria Gerald ON 06/15/17 AT 1616 [facility]    Enterobacter cloacae complex NOT DETECTED NOT DETECTED Final   Escherichia coli DETECTED (A) NOT DETECTED Final    Comment: CRITICAL RESULT CALLED TO, READ BACK BY AND VERIFIED WITH: Ahria Slappey ON 06/15/17 AT 1616 [facility]    Klebsiella oxytoca NOT DETECTED NOT DETECTED Final   Klebsiella pneumoniae NOT DETECTED NOT DETECTED Final   Proteus species NOT DETECTED NOT DETECTED Final   Serratia marcescens NOT DETECTED NOT DETECTED Final   Carbapenem resistance NOT DETECTED NOT DETECTED Final   Haemophilus influenzae NOT DETECTED NOT DETECTED Final   Neisseria meningitidis NOT DETECTED NOT DETECTED Final   Pseudomonas aeruginosa NOT DETECTED NOT DETECTED Final   Candida albicans NOT DETECTED NOT DETECTED Final   Candida glabrata NOT DETECTED NOT DETECTED Final   Candida krusei NOT DETECTED NOT DETECTED Final   Candida parapsilosis NOT DETECTED NOT DETECTED Final   Candida tropicalis NOT DETECTED NOT DETECTED Final  Culture, respiratory (NON-Expectorated)     Status: None (Preliminary result)   Collection Time: 06/15/17  3:19 AM  Result Value Ref Range Status   Specimen Description TRACHEAL ASPIRATE  Final   Special Requests NONE  Final   Gram Stain   Final    FEW WBC PRESENT,BOTH PMN AND MONONUCLEAR FEW SQUAMOUS EPITHELIAL CELLS PRESENT ABUNDANT GRAM NEGATIVE RODS FEW GRAM POSITIVE COCCI Performed at [facility], [address] [place], [place] [address]    Culture PENDING  Incomplete   Report Status PENDING  Incomplete    Medical History: Past Medical History:  Diagnosis Date  . A-fib (HCC)   . BPH (benign prostatic hyperplasia)   . Diabetes mellitus without complication (HCC)   . Hypertension     Medications:  Medications Prior to Admission  Medication Sig Dispense Refill Last Dose  . atorvastatin (LIPITOR) 40 MG tablet Take 40 mg by mouth daily.   06/13/2017 at 1600  . bisacodyl (DULCOLAX) 5 MG EC tablet Take 15 mg  every 3 (three) days by mouth.   06/13/2017 at 0800  . carbamazepine (CARBATROL) 200 MG 12 hr capsule Take 200 mg 2 (two) times daily by mouth.   1 06/25/2017 at 0800  . CARTIA XT 180 MG 24 hr capsule Take 1 capsule by mouth daily  1 06/03/2017 at 0800  . docusate sodium (COLACE) 100 MG capsule Take 1 capsule (100 mg total) 2 (two) times daily by mouth. 10 capsule 0 06/08/2017 at 0800  . gabapentin (NEURONTIN) 300 MG capsule Take 300 mg 2 (two) times daily by mouth.    06/21/2017 at 0800  . glipiZIDE (GLUCOTROL) 5 MG tablet Take 5 mg 2 (two) times daily before a meal by mouth.   0 06/16/2017 at 0800  . magnesium hydroxide (MILK OF MAGNESIA) 400 MG/5ML suspension Take 30 mLs every 4 (four) hours as needed by mouth for mild constipation.   06/16/2017 at 0800  . magnesium oxide (MAG-OX) 400  MG tablet TAKE 1 TABLET BY MOUTH TWICE DAILY   23-Oct-2016 at 0800  . metFORMIN (GLUCOPHAGE-XR) 500 MG 24 hr tablet Take 500 mg daily by mouth.    23-Oct-2016 at 0800  . metolazone (ZAROXOLYN) 2.5 MG tablet Take 2.5 mg daily by mouth.   2 23-Oct-2016 at 0800  . metoprolol tartrate (LOPRESSOR) 25 MG tablet Take 25 mg 2 (two) times daily by mouth.   23-Oct-2016 at 0800  . oxyCODONE (OXY IR/ROXICODONE) 5 MG immediate release tablet Take 1 tablet (5 mg total) See admin instructions by mouth. Take 1 tab (5 mg) by mouth every 4 hours prn for mild pain and take 2 tabs (10 mg) by mouth every 4 hours prn for moderate to severe pain 120 tablet 0 23-Oct-2016 at 0800  . polyethylene glycol (MIRALAX / GLYCOLAX) packet Take 17 g daily by mouth. 14 each 0 23-Oct-2016 at 0800  . potassium chloride SA (K-DUR,KLOR-CON) 20 MEQ tablet Take 1 tablet (20 mEq total) daily by mouth. (Patient taking differently: Take 10 mEq daily by mouth. ) 30 tablet 0 23-Oct-2016 at 0800  . senna (SENOKOT) 8.6 MG tablet Take 1 tablet 2 (two) times daily by mouth.   23-Oct-2016 at 0800  . silodosin (RAPAFLO) 8 MG CAPS capsule Take 8 mg daily with breakfast by mouth.    23-Oct-2016 at 0800  . spironolactone (ALDACTONE) 25 MG tablet Take 25 mg daily by mouth.   23-Oct-2016 at 0800  . torsemide (DEMADEX) 5 MG tablet Take 1 tablet (5 mg total) daily by mouth. (Patient taking differently: Take 5 mg 3 (three) times daily as needed by mouth. ) 20 tablet 0 23-Oct-2016 at 0800  . XARELTO 15 MG TABS tablet Take one tablet by mouth daily  0 23-Oct-2016 at 0800  . ACCU-CHEK SOFTCLIX LANCETS lancets U UTD BID  0 UTD at UTD  . acetaminophen (TYLENOL) 325 MG tablet Take 2 tablets (650 mg total) every 4 (four) hours as needed by mouth for mild pain or fever ((score 1 to 3) or temp > 100.5). 30 tablet 0 PRN at PRN  . tamsulosin (FLOMAX) 0.4 MG CAPS capsule Take 1 capsule (0.4 mg total) daily by mouth. (Patient not taking: Reported on 23-Oct-2016) 30 capsule 0 Not Taking at 1030   Assessment: E Coli growing in 1 of 4 bottles (aerobic), KPC negative CrCl = 30.7 ml/min   Goal of Therapy:  resolution of infectin  Plan:  Expected duration 7 days with resolution of temperature and/or normalization of WBC   Will start Meropenem 1 gm IV Q12H on 11/19 @ 22:00.   Nyshaun Standage D 06/15/2017,9:35 PM

## 2017-06-15 NOTE — Progress Notes (Signed)
[employer] - [place] [facility]   PATIENT NAME: Harold Cervantes    MR#:  [id number]  DATE OF BIRTH:  12-07-1932  SUBJECTIVE:  CHIEF COMPLAINT:   Chief Complaint  Patient presents with  . Abdominal Pain   - Asian who had left hip surgery done last week, sent in from rehabilitation for possible ileus and constipation. Suffered cardiac arrest on the floor last night, questionable aspiration pneumonia -Appears critically ill this morning. Intubated, on sedation and 2 different pressors on maximum doses. Also appears to have seizures/myoclonic jerks  REVIEW OF SYSTEMS:  Review of Systems  Unable to perform ROS: Critical illness  and intubated  DRUG ALLERGIES:   Allergies  Allergen Reactions  . Pioglitazone Swelling  . Penicillins Rash  . Zolpidem Other (See Comments)    confusion    VITALS:  Blood pressure (!) 87/54, pulse (!) 121, temperature (!) 100.5 F (38.1 C), temperature source Axillary, resp. rate (!) 22, height 6' (1.829 m), weight 99.8 kg (220 lb), SpO2 94 %.  PHYSICAL EXAMINATION:  Physical Exam  GENERAL:  83 y.o.-year-old patient lying in the bed, critically ill appearing  EYES: Pupils equal, round, not reactive to light and accommodation. No scleral icterus. Extraocular muscles intact. Pale conjunctiva HEENT: Head atraumatic, normocephalic. Oropharynx and nasopharynx clear.  NECK:  Supple, no jugular venous distention. No thyroid enlargement, no tenderness. Orally intubated, OG-tube with dark brown suctioning material LUNGS: Normal breath sounds bilaterally, no wheezing, rales,rhonchi or crepitation. No use of accessory muscles of respiration. Significantly decreased basilar breath sounds CARDIOVASCULAR: S1, S2 normal. No murmurs, rubs, or gallops.  ABDOMEN: Soft, distended, nontender, . Hypoactive Bowel sounds present. No organomegaly or mass.  EXTREMITIES: No cyanosis, or clubbing. Cold feet to touch, left foot status post first toe  amputation. Has 1+ edema in his extremities NEUROLOGIC: Intubated and sedated with significant myoclonic jerks noted.  PSYCHIATRIC: The patient is sedated  SKIN: No obvious rash, lesion, or ulcer.    LABORATORY PANEL:   CBC Recent Labs  Lab 06/15/17 0431  WBC 2.4*  HGB 10.0*  HCT 29.0*  PLT 362   ------------------------------------------------------------------------------------------------------------------  Chemistries  Recent Labs  Lab 06/15/17 0001 06/15/17 0003 06/15/17 0431  NA 136  --  137  K 3.5  --  2.8*  CL 94*  --  103  CO2 23  --  23  GLUCOSE 284*  --  140*  BUN 29*  --  31*  CREATININE 1.59*  --  1.74*  CALCIUM 8.1*  --  6.6*  MG  --  2.4  --   AST 52*  --   --   ALT 20  --   --   ALKPHOS 79  --   --   BILITOT 1.2  --   --    ------------------------------------------------------------------------------------------------------------------  Cardiac Enzymes Recent Labs  Lab 06/15/17 0431  TROPONINI 0.39*   ------------------------------------------------------------------------------------------------------------------  RADIOLOGY:  Dg Abd 1 View  Result Date: 06/15/2017 CLINICAL DATA:  Initial evaluation for enteric tube placement. EXAM: ABDOMEN - 1 VIEW COMPARISON:  Prior CT from earlier the same day. FINDINGS: Enteric tube in place with tip and side hole overlying the stomach, well beyond the GE junction. Multiple dilated gas-filled loops of bowel noted within the upper abdomen, similar to prior CT. Cardiomegaly with left basilar opacity, likely atelectasis. Pacemaker electrodes partially visualized. Possible small left pleural effusion. IMPRESSION: Tip and side hole of enteric tube overlying the stomach, well beyond the GE junction. Electronically Signed  By: Rise MuBenjamin  McClintock M.D.   On: 06/15/2017 00:11   Ct Abdomen Pelvis W Contrast  Result Date: 02/07/17 CLINICAL DATA:  Abdominal pain, distention EXAM: CT ABDOMEN AND PELVIS WITH  CONTRAST TECHNIQUE: Multidetector CT imaging of the abdomen and pelvis was performed using the standard protocol following bolus administration of intravenous contrast. CONTRAST:  100mL ISOVUE-300 IOPAMIDOL (ISOVUE-300) INJECTION 61% COMPARISON:  None. FINDINGS: Lower chest: Cardiomegaly. Pacer wires noted in the right heart. Trace bilateral pleural effusions with bibasilar atelectasis. Hepatobiliary: No focal hepatic abnormality. Gallbladder unremarkable. Pancreas: No focal abnormality or ductal dilatation. Spleen: No focal abnormality.  Normal size. Adrenals/Urinary Tract: No adrenal abnormality. No focal renal abnormality. No stones or hydronephrosis. Urinary bladder is unremarkable. Foley catheter in place within the bladder which is decompressed. Stomach/Bowel: There is fluid and gas distention of the small bowel and right colon and transverse colon. Distal small bowel is decompressed without focal abrupt caliber change Vascular/Lymphatic: Aortic and iliac calcifications. No aneurysm. No adenopathy. Reproductive:  No visible focal abnormality. Other: No free fluid or free air. Small bilateral inguinal hernias containing fat. Musculoskeletal: No acute bony abnormality. Prior left hip replacement. IMPRESSION: Dilated small bowel loops with air-fluid levels. The distal small bowel loop is decompressed without abrupt caliber change. Cannot completely exclude distal small bowel obstruction. However, the right colon and transverse colon are also moderately dilated with fluid and gas. Findings could reflect ileus. Trace bilateral pleural effusions, bibasilar atelectasis. Cardiomegaly. Aortic atherosclerosis. Electronically Signed   By: Charlett NoseKevin  Dover M.D.   On: 02/07/17 13:10   Dg Chest Port 1 View  Result Date: 06/15/2017 CLINICAL DATA:  Central line placement EXAM: PORTABLE CHEST 1 VIEW COMPARISON:  02/07/17 FINDINGS: Central venous catheter tip measures 4 cm above the carina. Enteric tube tip is below the  level of the EG junction but off the field of view. A left central venous catheter has been placed. Tip is over the lateral aspect of the right mediastinum consistent with location of the junction of the SVC and brachiocephalic veins. Cardiac enlargement. No vascular congestion. No edema or consolidation. Atelectasis in the lung bases. No pneumothorax. No pleural effusions. IMPRESSION: Appliances positioned as described. Left central venous catheter tip is over the junction of the brachiocephalic vein and the SVC. Cardiac enlargement. Atelectasis in the lung bases. Electronically Signed   By: Burman NievesWilliam  Stevens M.D.   On: 06/15/2017 05:00   Dg Chest Port 1 View  Result Date: 06/15/2017 CLINICAL DATA:  Initial evaluation for endotracheal tube placement. EXAM: PORTABLE CHEST 1 VIEW COMPARISON:  Prior radiograph from 02/07/17 FINDINGS: Endotracheal tube in place with tip well positioned 3.7 cm above the carina. Enteric tube courses in the the abdomen. Left-sided pacemaker/ AICD in place. Defibrillator pads overlie the chest. Stable cardiomegaly. Mediastinal silhouette normal. Aortic atherosclerosis. Lungs mildly hypoinflated. Possible small left pleural effusion. Left basilar opacity favored to reflect atelectasis. No pulmonary edema. No pneumothorax. No acute osseus abnormality. IMPRESSION: 1. Tip of endotracheal tube well positioned 3.7 cm above the carina. 2. Cardiomegaly with suspected small left pleural effusion. Associated left basilar opacity favored to reflect atelectasis. Electronically Signed   By: Rise MuBenjamin  McClintock M.D.   On: 06/15/2017 00:10   Dg Chest Port 1 View  Result Date: 02/07/17 CLINICAL DATA:  AFib and hypertension EXAM: PORTABLE CHEST 1 VIEW COMPARISON:  06/08/2017 FINDINGS: 1413 hours. The cardio pericardial silhouette is enlarged. The lungs are clear without focal pneumonia, edema, or pneumothorax. Possible tiny left pleural effusion. Old left clavicle fracture noted.  Left  permanent pacemaker remains in place. Telemetry leads overlie the chest. IMPRESSION: Cardiomegaly with possible tiny left pleural effusion. Electronically Signed   By: Kennith CenterEric  Mansell M.D.   On: Dec 24, 2016 14:43    EKG:   Orders placed or performed during the hospital encounter of 08-26-2016  . EKG 12-Lead  . EKG 12-Lead  . EKG 12-Lead  . EKG 12-Lead    ASSESSMENT AND PLAN:   83 year old male with past medical history significant for atrial fibrillation on Xarelto, diabetes, hypertension, benign prosthetic hypertrophy who recently had a left hip surgery last week was brought in initially secondary to ileus and constipation  #1 acute hypoxic respiratory failure-possible aspiration pneumonia -Intubated on the ventilator, intensivist consult -Continue nebulizer treatments, -On broad-spectrum antibiotics with vancomycin and Zosyn  #2 hypotension-questionable cardiogenic versus septic shock -Check echocardiogram. Continue pressors with Levophed and Neo-Synephrine. Also on IV stress dose steroids  #3 acute encephalopathy-with myoclonic jerks, could have had anoxic brain injury with seizures -Recommend CT head, neurology consult and neuro checks -Agree with Depakote IV  #4 atrial fibrillation-tachycardic and hypotensive, hold metoprolol -Old Xarelto at this time due to possible upper GI bleed  #5 Hypokalemia- recommend supplementation  #6 ARF- ATN from hypotension monitor  #7 ileus, nausea and vomiting- on medications. Patient had several bowel movements -Continue to monitor KUB  to rule out obstruction due to constant nausea and vomiting -On IV Protonix, continue OG suction at this time  #8 DVT prophylaxis heparin subcutaneous heparin  Patient is very critically ill at this time. High risk for repeat cardiac- arrest    All the records are reviewed and case discussed with Care Management/Social Workerr. Management plans discussed with the patient, family and they are in  agreement.  CODE STATUS: Full code  TOTAL TIME TAKING CARE OF THIS PATIENT: 39 minutes.   POSSIBLE D/C IN ? DAYS, DEPENDING ON CLINICAL CONDITION.   Enid BaasKALISETTI,Renatta Shrieves M.D on 06/15/2017 at 10:03 AM  Between 7am to 6pm - Pager - [phone]  After 6pm go to [email or web address] - Social research officer, governmentpassword EPAS [facility]  [facility] [place] [facility]  Office  [phone]  CC: Primary care physician; Lauro RegulusAnderson, Marshall W, MD

## 2017-06-15 NOTE — Progress Notes (Signed)
RN discussed with Dr. Sung AmabileSimonds at bedside that patients heart rate continues to remain elevated on amiodarone drip at 60 mg/H.  Heart rate 140-160's afib.  Patient also maxed out on levophed drip and phenylephrine drip is at 350 mcg/min also on vasopressin.  MD acknowledged all mentioned above and discussed cardioversion but gave no new orders at this time.

## 2017-06-15 NOTE — Progress Notes (Signed)
CC: ileus Subjective: Rsp distress, aspirated and coded. Now on pressors intubated and critical  Objective: Vital signs in last 24 hours: Temp:  [97.7 F (36.5 C)-100.5 F (38.1 C)] 100.5 F (38.1 C) (11/19 0815) Pulse Rate:  [78-124] 111 (11/19 1000) Resp:  [14-24] 23 (11/19 1000) BP: (53-146)/(32-85) 87/54 (11/19 0800) SpO2:  [88 %-97 %] 95 % (11/19 1000) Arterial Line BP: (101-123)/(41-51) 103/47 (11/19 1000) FiO2 (%):  [60 %-100 %] 60 % (11/19 0825) Last BM Date: 06/23/2017  Intake/Output from previous day: 11/18 0701 - 11/19 0700 In: 5494 [I.V.:5394; IV Piggyback:100] Out: 410 [Urine:410] Intake/Output this shift: Total I/O In: 790 [I.V.:385; IV Piggyback:405] Out: 20 [Urine:20]  Physical exam:  Sedated, critically ill on multiple pressors Abd: soft, no peritonitis   Lab Results: CBC  Recent Labs    06/15/17 0001 06/15/17 0431  WBC 9.1 2.4*  HGB 10.6* 10.0*  HCT 31.2* 29.0*  PLT 381 362   BMET Recent Labs    06/15/17 0001 06/15/17 0431  NA 136 137  K 3.5 2.8*  CL 94* 103  CO2 23 23  GLUCOSE 284* 140*  BUN 29* 31*  CREATININE 1.59* 1.74*  CALCIUM 8.1* 6.6*   PT/INR No results for input(s): LABPROT, INR in the last 72 hours. ABG Recent Labs    06/15/17 0015  PHART 7.38  HCO3 25.4    Studies/Results: Dg Abd 1 View  Result Date: 06/15/2017 CLINICAL DATA:  Initial evaluation for enteric tube placement. EXAM: ABDOMEN - 1 VIEW COMPARISON:  Prior CT from earlier the same day. FINDINGS: Enteric tube in place with tip and side hole overlying the stomach, well beyond the GE junction. Multiple dilated gas-filled loops of bowel noted within the upper abdomen, similar to prior CT. Cardiomegaly with left basilar opacity, likely atelectasis. Pacemaker electrodes partially visualized. Possible small left pleural effusion. IMPRESSION: Tip and side hole of enteric tube overlying the stomach, well beyond the GE junction. Electronically Signed   By: Benjamin   McClintock M.D.   On: 06/15/2017 00:11   Ct Abdomen Pelvis W Contrast  Result Date: 06/24/2017 CLINICAL DATA:  Abdominal pain, distention EXAM: CT ABDOMEN AND PELVIS WITH CONTRAST TECHNIQUE: Multidetector CT imaging of the abdomen and pelvis was performed using the standard protocol following bolus administration of intravenous contrast. CONTRAST:  <MEASUREMEAdventist Medical Center - ReedlShe31rBurgeKoreMMarland KitchStNeuros<MEASUREMENTWestern [facility] Group Endoscopy Center Dba The Endoscopy CentShe73rBurgeKoreMMarland KitchStNeurosurgeo<MEASUREMENTMedina HospitShe29rBurgeKoreMMarland KitchStNeurosurgeonc<MEASUREMENTHuntington Beach HospitShe30rBurgeKoreMMarland KitchStNeurosurgeoncyNo<MEASUREMENTSelect Specialty Hospital - Si[facility] FalShe67rBurgeKoreMMarland KitchStNeuros<MEASUREMENTKindred Hospital Northwest IndiaShe71rBurgeKoreMMarland KitchStNeuros<MEASUREMENTMclaren Central MichigShe49rBurgeKoreMMarland KitchStNeurosurgeoncyMNo<MEASUREMENTCalifornia Pacific Medical Center - Van Ness CampShe89rBurgeKoreMMarland KitchStNeurosurgeoNo<MEASUREMENTMidmichigan Medical Center-ClaShe82rBurgeKoreMMarland KitchStNeurosurgeoncy<MEASUREMENTGrandview Medical CentShe30rBurgeKoreMMarland KitchStNeurosurgeo<MEASUREMENTS[facility] LShe63rBurgeKoreMMarland KitchStNeurosurg<MEASUREMENT[facility] She46rBurgeKoreMMarland KitchStNeurosurgeo<MEASUREMENTThe [facility] Rehabilitation HospitShe74rBurgeKoreMMarland KitchStNeurosu<MEASUREMENTGriffin HospitShe37rBurgeKoreMMarland KitchStNeurosurgeoncyNo<MEASUREMENTMethodist Hospital GermantoShe58rBurgeKoreMMarland KitchStNeurosurgeoncy<MEASUREMENTAd[facility]ist Health [facility] Medical CentShe40rBurgeKoreMMarland KitchStNeurosurg<MEASUREMENT[facility] CounShe75rBurgeKoreMMarland KitchStNeurosurgeoncyA<MEASUREMENTSt. [facility] Newburgh CampShe72rBurgeKoreMMarland KitchStNeurosurgeoncyLake SaNo<MEASUREMENT[facility] LShe65rBurgeKoreMMarland KitchStNeurosurge<MEASUREPinnacle Regional HospitShe28rBurStNeu<MEASUREMENTFloyd Medical CentShe79rBurgeKoreMMarland KitchStNeurosurgeonc<MEASUREMENTSt. Luke'S Meridian Medical CentShe92rBurgeKoreMMarland KitchStNeurosurgeon<MEASUREMENTW[facility] Center LShe66rBurgeKoreMMarland KitchStNeurosurgeoncy<MEASUREMENTEssentia Health Northern PinShe42rBurgeKoreMMarland KitchStNeurosu<MEASUREMENTBay [facility] Hospital And Medical CenteShe72rBurgeKoreMMarland KitchStNeurosurge<MEASUREMENTOr[facility] City Surgery CentShe20rBurgeKoreMMarland KitchStNeurosur<MEASUREMENTLhz Ltd Dba St Clare Surgery CentShe18rBurgeKoreMMarland KitchStNeurosurgeoncyC<MEASURELeader Surgical Center ISheStNeu<MEASUREMENTLexington Medical CentShe2rBurgeKoreMMarland KitchStNeurosu<MEASUREMENTLogan Regional HospitShe65rBurgeKoreMMarland KitchStNeurosurgeoncy1Haskel KhanAMIDOL (ISOVUE-300) INJECTION 61% COMPARISON:  None. FINDINGS: Lower chest: Cardiomegaly. Pacer wires noted in the right heart. Trace bilateral pleural effusions with bibasilar atelectasis. Hepatobiliary: No focal hepatic abnormality. Gallbladder unremarkable. Pancreas: No focal abnormality or ductal dilatation. Spleen: No focal abnormality.  Normal size. Adrenals/Urinary Tract: No adrenal abnormality. No focal renal abnormality. No stones or hydronephrosis. Urinary bladder is unremarkable. Foley catheter in place within the bladder which is decompressed. Stomach/Bowel: There is fluid and gas distention of the small bowel and right colon and transverse colon. Distal small bowel is decompressed without focal abrupt caliber change Vascular/Lymphatic: Aortic and iliac calcifications. No aneurysm. No adenopathy. Reproductive:  No visible focal abnormality. Other: No free fluid or free air. Small bilateral inguinal hernias containing fat. Musculoskeletal: No acute bony abnormality. Prior left hip replacement. IMPRESSION: Dilated small bowel loops with air-fluid levels. The distal small bowel loop is decompressed without abrupt caliber change. Cannot completely exclude distal small bowel obstruction. However, the right colon and transverse colon are also moderately dilated with fluid and gas. Findings could reflect ileus. Trace bilateral pleural effusions, bibasilar atelectasis. Cardiomegaly. Aortic atherosclerosis. Electronically Signed   By: Kevin  Dover M.D.   On:  08/31/16 13:10   Dg Chest Port 1 View  Result Date: 06/15/2017 CLINICAL DATA:   Central line placement EXAM: PORTABLE CHEST 1 VIEW COMPARISON:  08/31/16 FINDINGS: Central venous catheter tip measures 4 cm above the carina. Enteric tube tip is below the level of the EG junction but off the field of view. A left central venous catheter has been placed. Tip is over the lateral aspect of the right mediastinum consistent with location of the junction of the SVC and brachiocephalic veins. Cardiac enlargement. No vascular congestion. No edema or consolidation. Atelectasis in the lung bases. No pneumothorax. No pleural effusions. IMPRESSION: Appliances positioned as described. Left central venous catheter tip is over the junction of the brachiocephalic vein and the SVC. Cardiac enlargement. Atelectasis in the lung bases. Electronically Signed   By: Burman NievesWilliam  Stevens M.D.   On: 06/15/2017 05:00   Dg Chest Port 1 View  Result Date: 06/15/2017 CLINICAL DATA:  Initial evaluation for endotracheal tube placement. EXAM: PORTABLE CHEST 1 VIEW COMPARISON:  Prior radiograph from 08/31/16 FINDINGS: Endotracheal tube in place with tip well positioned 3.7 cm above the carina. Enteric tube courses in the the abdomen. Left-sided pacemaker/ AICD in place. Defibrillator pads overlie the chest. Stable cardiomegaly. Mediastinal silhouette normal. Aortic atherosclerosis. Lungs mildly hypoinflated. Possible small left pleural effusion. Left basilar opacity favored to reflect atelectasis. No pulmonary edema. No pneumothorax. No acute osseus abnormality. IMPRESSION: 1. Tip of endotracheal tube well positioned 3.7 cm above the carina. 2. Cardiomegaly with suspected small left pleural effusion. Associated left basilar opacity favored to reflect atelectasis. Electronically Signed   By: Rise MuBenjamin  McClintock M.D.   On: 06/15/2017 00:10   Dg Chest Port 1 View  Result Date: 08/31/16 CLINICAL DATA:  AFib and hypertension EXAM: PORTABLE CHEST 1 VIEW COMPARISON:  06/08/2017 FINDINGS: 1413 hours. The cardio pericardial  silhouette is enlarged. The lungs are clear without focal pneumonia, edema, or pneumothorax. Possible tiny left pleural effusion. Old left clavicle fracture noted. Left permanent pacemaker remains in place. Telemetry leads overlie the chest. IMPRESSION: Cardiomegaly with possible tiny left pleural effusion. Electronically Signed   By: Kennith CenterEric  Mansell M.D.   On: 08/31/16 14:43    Anti-infectives: Anti-infectives (From admission, onward)   Start     Dose/Rate Route Frequency Ordered Stop   06/15/17 0800  vancomycin (VANCOCIN) IVPB 1000 mg/200 mL premix     1,000 mg 200 mL/hr over 60 Minutes Intravenous Every 12 hours 06/15/17 0233     06/15/17 0000  piperacillin-tazobactam (ZOSYN) IVPB 3.375 g     3.375 g 12.5 mL/hr over 240 Minutes Intravenous Every 8 hours 06-01-2017 2350     06/15/17 0000  vancomycin (VANCOCIN) IVPB 1000 mg/200 mL premix     1,000 mg 200 mL/hr over 60 Minutes Intravenous  Once 06-01-2017 2353 06/15/17 0145   06-01-2017 2330  ceFEPIme (MAXIPIME) 2 g in dextrose 5 % 50 mL IVPB  Status:  Discontinued     2 g 100 mL/hr over 30 Minutes Intravenous Every 12 hours 06-01-2017 2327 06-01-2017 2342      Assessment/Plan:  Ileus, currently critically ill and dying No surgical intervention We will sign off at this time  Sterling Bigiego Nabria Nevin, MD, FACS  06/15/2017

## 2017-06-15 NOTE — Procedures (Signed)
Central Venous Catheter Insertion Procedure Note Harold Cervantes [id number] [birth year]  Procedure: Insertion of Central Venous Catheter Indications: Assessment of intravascular volume, Drug and/or fluid administration and Frequent blood sampling  Procedure Details Consent: Risks of procedure as well as the alternatives and risks of each were explained to the (patient/caregiver).  Consent for procedure obtained. Time Out: Verified patient identification, verified procedure, site/side was marked, verified correct patient position, special equipment/implants available, medications/allergies/relevent history reviewed, required imaging and test results available.  Performed  Maximum sterile technique was used including antiseptics, cap, gloves, gown, hand hygiene, mask and sheet. Skin prep: Chlorhexidine; local anesthetic administered A antimicrobial bonded/coated triple lumen catheter was placed in the left internal jugular vein using the Seldinger technique.  Evaluation Blood flow good Complications: No apparent complications Patient did tolerate procedure well. Chest X-ray ordered to verify placement.  CXR: normal.  Procedure performed under direct supervision of Dr.Simonds. Ultrasound utilized for realtime vessel cannulation  Tabias Swayze S. [facility] Centerukov ANP-BC Pulmonary and Critical Care Medicine [facility] Pager [phone] or [phone]  06/15/2017, 6:41 AM

## 2017-06-16 ENCOUNTER — Inpatient Hospital Stay: Payer: Medicare Other

## 2017-06-16 ENCOUNTER — Inpatient Hospital Stay (HOSPITAL_COMMUNITY): Payer: Medicare Other

## 2017-06-16 ENCOUNTER — Inpatient Hospital Stay (HOSPITAL_COMMUNITY)
Admit: 2017-06-16 | Discharge: 2017-06-16 | Disposition: A | Discharge status: Home / Self Care | Payer: Medicare Other | Attending: Adult Health | Admitting: Adult Health

## 2017-06-16 DIAGNOSIS — R7881 Bacteremia: Secondary | ICD-10-CM

## 2017-06-16 DIAGNOSIS — I469 Cardiac arrest, cause unspecified: Secondary | ICD-10-CM

## 2017-06-16 DIAGNOSIS — R0603 Acute respiratory distress: Secondary | ICD-10-CM

## 2017-06-16 LAB — PROCALCITONIN: Procalcitonin: >150 ng/mL

## 2017-06-16 LAB — COMPREHENSIVE METABOLIC PANEL
ALK PHOS: 80 U/L (ref 38–126)
ALT: 22 U/L (ref 17–63)
AST: 80 U/L — ABNORMAL HIGH (ref 15–41)
Albumin: 1.7 g/dL — ABNORMAL LOW (ref 3.5–5.0)
Anion gap: 16 — ABNORMAL HIGH (ref 5–15)
BUN: 44 mg/dL — ABNORMAL HIGH (ref 6–20)
CALCIUM: 6.8 mg/dL — AB (ref 8.9–10.3)
CO2: 19 mmol/L — ABNORMAL LOW (ref 22–32)
CREATININE: 2.84 mg/dL — AB (ref 0.61–1.24)
Chloride: 103 mmol/L (ref 101–111)
GFR, EST AFRICAN AMERICAN: 22 mL/min — AB (ref >60)
GFR, EST NON AFRICAN AMERICAN: 19 mL/min — AB (ref >60)
Glucose, Bld: 156 mg/dL — ABNORMAL HIGH (ref 65–99)
Potassium: 4.6 mmol/L (ref 3.5–5.1)
Sodium: 138 mmol/L (ref 135–145)
Total Bilirubin: 2.4 mg/dL — ABNORMAL HIGH (ref 0.3–1.2)
Total Protein: 4.7 g/dL — ABNORMAL LOW (ref 6.5–8.1)

## 2017-06-16 LAB — CBC
HEMATOCRIT: 28.4 % — AB (ref 40.0–52.0)
HEMOGLOBIN: 9.4 g/dL — AB (ref 13.0–18.0)
MCH: 31.3 pg (ref 26.0–34.0)
MCHC: 33.1 g/dL (ref 32.0–36.0)
MCV: 94.6 fL (ref 80.0–100.0)
Platelets: 259 10*3/uL (ref 150–440)
RBC: 3 MIL/uL — AB (ref 4.40–5.90)
RDW: 15.2 % — ABNORMAL HIGH (ref 11.5–14.5)
WBC: 21.8 10*3/uL — ABNORMAL HIGH (ref 3.8–10.6)

## 2017-06-16 LAB — ECHOCARDIOGRAM COMPLETE
Height: 72 in
Weight: 3520 oz

## 2017-06-16 LAB — GLUCOSE, CAPILLARY
GLUCOSE-CAPILLARY: 118 mg/dL — AB (ref 65–99)
GLUCOSE-CAPILLARY: 141 mg/dL — AB (ref 65–99)
Glucose-Capillary: 142 mg/dL — ABNORMAL HIGH (ref 65–99)
Glucose-Capillary: 152 mg/dL — ABNORMAL HIGH (ref 65–99)
Glucose-Capillary: 171 mg/dL — ABNORMAL HIGH (ref 65–99)
Glucose-Capillary: 155 mg/dL — ABNORMAL HIGH (ref 65–99)

## 2017-06-16 LAB — TROPONIN I: TROPONIN I: 0.28 ng/mL — AB (ref <0.03)

## 2017-06-16 MED ORDER — CHLORHEXIDINE GLUCONATE 0.12 % MT SOLN
OROMUCOSAL | Status: AC
  Administered 2017-06-16: 15 mL via OROMUCOSAL
  Filled 2017-06-16: qty 15

## 2017-06-16 MED ORDER — SODIUM CHLORIDE 0.9 % IV SOLN
500.0000 mg | Freq: Two times a day (BID) | INTRAVENOUS | Status: DC
  Administered 2017-06-16: 500 mg via INTRAVENOUS
  Filled 2017-06-16 (×2): qty 0.5

## 2017-06-16 NOTE — Progress Notes (Signed)
Chaplain made a follow up with pt and met pt's family in family consult Rm. Pt's wife, sons and sister-in-law are anxious and concerned about pt's current health status. They talked about the worries and hopes and asked Burna offered words of encouragement from Psalms 23 and 42, and prayed with the family. [place] escorted two of the pt's sons to the pt's Rm and provided pastoral support and presence for one of the son who was overwhelmed by emotions. CH is available to follow up pt and family as needed.   06/16/17 1100  Clinical Encounter Type  Visited With Patient;Patient and family together  Visit Type Follow-up;Spiritual support;Other (Comment)  Referral From Family  Consult/Referral To Chaplain  Spiritual Encounters  Spiritual Needs Prayer;Emotional;Other (Comment)

## 2017-06-16 NOTE — Progress Notes (Signed)
PULMONARY / CRITICAL CARE MEDICINE   Name: Harold EisenmengerClarence F Verville MRN: [id number] DOB: Dec 01, 1932    ADMISSION DATE:  08-02-16  PT PROFILE:   [age] M with recent L THR admitted 11/18 with abdominal pain due to opioid induced obstipation/ileus. Initially admitted to med-surg floor with cardiac monitoring. Developed N/V, then progressive hypoxemia and cardiac arrest. Underwent 10-15 mins ACLS before ROSC. Intubated and transferred to ICU  MAJOR EVENTS/TEST RESULTS: 11/18 admission as above 11/18 CTAP: Dilated small bowel loops with air-fluid levels. The distal small bowel loop is decompressed without abrupt caliber change. Cannot completely exclude distal small bowel obstruction. However, the right colon and transverse colon are also moderately dilated with fluid and gas. Findings could reflect ileus 11/18 Gen Surgery consultation: No indication for surgery.  Bowel regimen recommended including enemas and suppositories. 11/18 N/V, likely aspiration event, cardiac arrest, ACLS X 10-15 minutes, intubated and transferred to ICU requiring vasopressors 11/19 Moderate myoclonus > attenuated with valproic acid. Max dose vasopressors. Oliguria. Goals of care discussed with family.  Likely poor prognosis conveyed.  DNR established.  Full aggressive support short of ACLS to continue.  No dialysis to be initiated unless improving on all other fronts.  11/20 remains comatose off of all sedatives.  Remains anuric.  Remains on maximum dose of vasopressors. 11/20 CT head:  11/20 EEG:   INDWELLING DEVICES:: ETT 11/18 >>  L IJ CVL 11/19 >>  R femoral A-line 11/19 >>  MICRO DATA: C diff 11/18 >> NEG Urine 11/19 >> 50K E coli Resp 11/19 >>  Blood 11/19 >> E. coli  ANTIMICROBIALS:  Vancomycin 11/18 >> 11/19 Pip-tazo 11/18 >> 11/19 Meropenem 11/19 >>    SUBJECTIVE:  Comatose  VITAL SIGNS: BP (!) 98/51   Pulse (!) 121   Temp 99 F (37.2 C)   Resp (!) 29   Ht 6' (1.829 m)   Wt 99.8 kg (220 lb)    SpO2 96%   BMI 29.84 kg/m   HEMODYNAMICS: CVP:  [16 mmHg-23 mmHg] 23 mmHg  VENTILATOR SETTINGS: Vent Mode: PRVC FiO2 (%):  [40 %] 40 % Set Rate:  [18 bmp] 18 bmp Vt Set:  [500 mL] 500 mL PEEP:  [5 cmH20] 5 cmH20 Plateau Pressure:  [15 cmH20-16 cmH20] 16 cmH20  INTAKE / OUTPUT: I/O last 3 completed shifts: In: 10670.1 [I.V.:9815.1; IV Piggyback:855] Out: 1170 [Urine:95; Emesis/NG output:1075]  PHYSICAL EXAMINATION: General: Comatose, spontaneous ventilatory efforts persist Neuro: comatose, pupils 3 mm and equal, no withdrawal HEENT: NCAT, sclerae white Cardiovascular: IRIR, tachy, no M noted Lungs: no wheezes Abdomen: Distended, tympanitic, diminished BS Ext: BLE wrapped, chronic stasis changes  LABS:  BMET Recent Labs  Lab 06/15/17 0431 06/15/17 1640 06/16/17 0346  NA 137 138 138  K 2.8* 4.0 4.6  CL 103 103 103  CO2 23 22 19*  BUN 31* 37* 44*  CREATININE 1.74* 2.27* 2.84*  GLUCOSE 140* 161* 156*    Electrolytes Recent Labs  Lab 06/11/17 0322  06/15/17 0003 06/15/17 0431 06/15/17 1640 06/16/17 0346  CALCIUM 7.4*   < >  --  6.6* 6.8* 6.8*  MG 2.0  --  2.4  --   --   --   PHOS  --   --  7.2*  --   --   --    < > = values in this interval not displayed.    CBC Recent Labs  Lab 06/15/17 0001 06/15/17 0431 06/16/17 0346  WBC 9.1 2.4* 21.8*  HGB 10.6* 10.0* 9.4*  HCT 31.2* 29.0* 28.4*  PLT 381 362 259    Coag's No results for input(s): APTT, INR in the last 168 hours.  Sepsis Markers Recent Labs  Lab 06/15/17 0001 06/15/17 0003 06/15/17 0431 06/16/17 0346  LATICACIDVEN 9.0*  --  7.8*  --   PROCALCITON  --  0.47 28.82 >150.00    ABG Recent Labs  Lab 06/15/17 0015  PHART 7.38  PCO2ART 40  PO2ART 212*    Liver Enzymes Recent Labs  Lab 05/31/2017 0940 06/15/17 0001 06/16/17 0346  AST 29 52* 80*  ALT 18 20 22   ALKPHOS 98 79 80  BILITOT 1.6* 1.2 2.4*  ALBUMIN 2.5* 2.1* 1.7*    Cardiac Enzymes Recent Labs  Lab  06/15/17 0431 06/15/17 1142 06/16/17 0346  TROPONINI 0.39* 0.31* 0.28*    Glucose Recent Labs  Lab 06/15/17 1938 06/15/17 2024 06/16/17 0009 06/16/17 0350 06/16/17 0726 06/16/17 1120  GLUCAP 139* 128* 118* 142* 152* 171*    CXR: Cardiomegaly, vascular congestion, interstitial prominence, probable right pleural effusion versus infiltrate   ASSESSMENT / PLAN:  PULMONARY A: Acute respiratory failure with hypoxemia Aspiration pneumonia/pneumonitis P:   Cont full vent support - settings reviewed and/or adjusted Cont vent bundle Daily SBT if/when meets criteria   CARDIOVASCULAR A:  Chronic atrial fibrillation with RVR S/P cardiac arrest - Likely due to aspiration event Cardiogenic shock Septic shock -on maximum doses of vasopressors P:  Continue vasopressors with MAP goal > 65 mmHg Continue amiodarone infusion initiated 11/19  RENAL A:   Oliguria Likely AKI/ATN Still not a candidate for CRRT P:   Monitor BMET intermittently Monitor I/Os Correct electrolytes as indicated   GASTROINTESTINAL A:   Opioid induced ileus Nausea/vomiting Abdominal distention P:   SUP: IV pantoprazole No nutrition for now - Reassess daily  HEMATOLOGIC A:   Leukopenia -resolved P:  DVT px: SQ heparin.  Monitor CBC intermittently Transfuse per usual guidelines  Holding full anticoagulation for now (was on rivaroxaban)  INFECTIOUS A:   Severe sepsis Aspiration pneumonia E. coli bacteremia -unclear source, likely urinary tract P:   Monitor temp, WBC count Micro and abx as above   ENDOCRINE A:   DM II, adequately controlled P:   Continue SSI protocol, moderate scale  NEUROLOGIC A:   Post anoxic encephalopathy, severe Severe myoclonus -resolved P:   RASS goal: -1, -2 Minimize sedation CTH, EEG ordered Consider neurology consultation as indicated Discontinue valproic acid   FAMILY: Wife, sons were updated.  Poor prognosis was again conveyed.  Decisions  regarding withdrawal of ventilator support contingent on CT and EEG findings    CCM time: 40 mins The above time includes time spent in consultation with patient and/or family members and reviewing care plan on multidisciplinary rounds  Billy Fischeravid Zeffie Bickert, MD PCCM service Mobile [phone] Pager [phone]    06/16/2017, 3:07 PM

## 2017-06-16 NOTE — Progress Notes (Signed)
[facility] [facility] [place] [facility]   PATIENT NAME: Harold Cervantes    MR#:  [id number]  DATE OF BIRTH:  1933/07/10  SUBJECTIVE:  CHIEF COMPLAINT:   Chief Complaint  Patient presents with  . Abdominal Pain   - Critically ill appearing. On 3 vasopressors at this time. -MAXIMUM TEMPERATURE of 105 last night requiring coaling blanket -Also started on amiodarone drip due to tachycardia. Patient is not on any sedation but not responding at this time  REVIEW OF SYSTEMS:  Review of Systems  Unable to perform ROS: Critical illness  and intubated  DRUG ALLERGIES:   Allergies  Allergen Reactions  . Pioglitazone Swelling  . Penicillins Rash  . Zolpidem Other (See Comments)    confusion    VITALS:  Blood pressure 98/64, pulse (!) 113, temperature (!) 97.3 F (36.3 C), temperature source Rectal, resp. rate (!) 25, height 6' (1.829 m), weight 99.8 kg (220 lb), SpO2 94 %.  PHYSICAL EXAMINATION:  Physical Exam  GENERAL:  83 y.o.-year-old patient lying in the bed, critically ill appearing  EYES: Pupils equal, round, not reactive to light and accommodation. No scleral icterus. Extraocular muscles intact. Pale conjunctiva HEENT: Head atraumatic, normocephalic. Oropharynx and nasopharynx clear.  NECK:  Supple, no jugular venous distention. No thyroid enlargement, no tenderness. Orally intubated, OG-tube with dark brown suctioning material LUNGS: Normal breath sounds bilaterally, no wheezing, rales,rhonchi or crepitation. No use of accessory muscles of respiration. Significantly decreased basilar breath sounds CARDIOVASCULAR: S1, S2 normal. No murmurs, rubs, or gallops.  ABDOMEN: Soft, distended, nontender, . Hypoactive Bowel sounds present. No organomegaly or mass.  EXTREMITIES: No cyanosis, or clubbing. 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 Has 1+ edema in his extremities NEUROLOGIC: Intubated and not responding, no purposeful movements or  tracking. GCS of 5.  PSYCHIATRIC: The patient is not responding SKIN: No obvious rash, lesion, or ulcer.    LABORATORY PANEL:   CBC Recent Labs  Lab 06/16/17 0346  WBC 21.8*  HGB 9.4*  HCT 28.4*  PLT 259   ------------------------------------------------------------------------------------------------------------------  Chemistries  Recent Labs  Lab 06/15/17 0003  06/16/17 0346  NA  --    < > 138  K  --    < > 4.6  CL  --    < > 103  CO2  --    < > 19*  GLUCOSE  --    < > 156*  BUN  --    < > 44*  CREATININE  --    < > 2.84*  CALCIUM  --    < > 6.8*  MG 2.4  --   --   AST  --   --  80*  ALT  --   --  22  ALKPHOS  --   --  80  BILITOT  --   --  2.4*   < > = values in this interval not displayed.   ------------------------------------------------------------------------------------------------------------------  Cardiac Enzymes Recent Labs  Lab 06/16/17 0346  TROPONINI 0.28*   ------------------------------------------------------------------------------------------------------------------  RADIOLOGY:  Dg Abd 1 View  Result Date: 06/15/2017 CLINICAL DATA:  Initial evaluation for enteric tube placement. EXAM: ABDOMEN - 1 VIEW COMPARISON:  Prior CT from earlier the same day. FINDINGS: Enteric tube in place with tip and side hole overlying the stomach, well beyond the GE junction. Multiple dilated gas-filled loops of bowel noted within the upper abdomen, similar to prior CT. Cardiomegaly with left basilar opacity, likely atelectasis. Pacemaker electrodes  partially visualized. Possible small left pleural effusion. IMPRESSION: Tip and side hole of enteric tube overlying the stomach, well beyond the GE junction. Electronically Signed   By: Rise MuBenjamin  McClintock M.D.   On: 06/15/2017 00:11   Ct Abdomen Pelvis W Contrast  Result Date: 06/06/2017 CLINICAL DATA:  Abdominal pain, distention EXAM: CT ABDOMEN AND PELVIS WITH CONTRAST TECHNIQUE: Multidetector CT imaging of the  abdomen and pelvis was performed using the standard protocol following bolus administration of intravenous contrast. CONTRAST:  100mL ISOVUE-300 IOPAMIDOL (ISOVUE-300) INJECTION 61% COMPARISON:  None. FINDINGS: Lower chest: Cardiomegaly. Pacer wires noted in the right heart. Trace bilateral pleural effusions with bibasilar atelectasis. Hepatobiliary: No focal hepatic abnormality. Gallbladder unremarkable. Pancreas: No focal abnormality or ductal dilatation. Spleen: No focal abnormality.  Normal size. Adrenals/Urinary Tract: No adrenal abnormality. No focal renal abnormality. No stones or hydronephrosis. Urinary bladder is unremarkable. Foley catheter in place within the bladder which is decompressed. Stomach/Bowel: There is fluid and gas distention of the small bowel and right colon and transverse colon. Distal small bowel is decompressed without focal abrupt caliber change Vascular/Lymphatic: Aortic and iliac calcifications. No aneurysm. No adenopathy. Reproductive:  No visible focal abnormality. Other: No free fluid or free air. Small bilateral inguinal hernias containing fat. Musculoskeletal: No acute bony abnormality. Prior left hip replacement. IMPRESSION: Dilated small bowel loops with air-fluid levels. The distal small bowel loop is decompressed without abrupt caliber change. Cannot completely exclude distal small bowel obstruction. However, the right colon and transverse colon are also moderately dilated with fluid and gas. Findings could reflect ileus. Trace bilateral pleural effusions, bibasilar atelectasis. Cardiomegaly. Aortic atherosclerosis. Electronically Signed   By: Charlett NoseKevin  Dover M.D.   On: 06/11/2017 13:10   Dg Chest Port 1 View  Result Date: 06/16/2017 CLINICAL DATA:  Respiratory failure EXAM: PORTABLE CHEST 1 VIEW COMPARISON:  1 day prior FINDINGS: Endotracheal tube terminates 3.7 cm above carina.Left-sided internal jugular line tip at high SVC. The nasogastric tube terminates at the body of  the stomach. Single lead pacer. Midline trachea. Moderate cardiomegaly. Suspect small layering bilateral pleural effusions. No pneumothorax. Mild pulmonary venous congestion is not significantly changed. Bibasilar airspace disease. IMPRESSION: Given differences in technique, no significant change. Cardiomegaly with pulmonary venous congestion, pleural effusions, and bibasilar atelectasis. Electronically Signed   By: Jeronimo GreavesKyle  Talbot M.D.   On: 06/16/2017 08:01   Dg Chest Port 1 View  Result Date: 06/15/2017 CLINICAL DATA:  Central line placement EXAM: PORTABLE CHEST 1 VIEW COMPARISON:  06/24/2017 FINDINGS: Central venous catheter tip measures 4 cm above the carina. Enteric tube tip is below the level of the EG junction but off the field of view. A left central venous catheter has been placed. Tip is over the lateral aspect of the right mediastinum consistent with location of the junction of the SVC and brachiocephalic veins. Cardiac enlargement. No vascular congestion. No edema or consolidation. Atelectasis in the lung bases. No pneumothorax. No pleural effusions. IMPRESSION: Appliances positioned as described. Left central venous catheter tip is over the junction of the brachiocephalic vein and the SVC. Cardiac enlargement. Atelectasis in the lung bases. Electronically Signed   By: Burman NievesWilliam  Stevens M.D.   On: 06/15/2017 05:00   Dg Chest Port 1 View  Result Date: 06/15/2017 CLINICAL DATA:  Initial evaluation for endotracheal tube placement. EXAM: PORTABLE CHEST 1 VIEW COMPARISON:  Prior radiograph from 06/09/2017 FINDINGS: Endotracheal tube in place with tip well positioned 3.7 cm above the carina. Enteric tube courses in the the abdomen. Left-sided pacemaker/  AICD in place. Defibrillator pads overlie the chest. Stable cardiomegaly. Mediastinal silhouette normal. Aortic atherosclerosis. Lungs mildly hypoinflated. Possible small left pleural effusion. Left basilar opacity favored to reflect atelectasis. No  pulmonary edema. No pneumothorax. No acute osseus abnormality. IMPRESSION: 1. Tip of endotracheal tube well positioned 3.7 cm above the carina. 2. Cardiomegaly with suspected small left pleural effusion. Associated left basilar opacity favored to reflect atelectasis. Electronically Signed   By: Rise Mu M.D.   On: 06/15/2017 00:10   Dg Chest Port 1 View  Result Date: 06/13/2017 CLINICAL DATA:  AFib and hypertension EXAM: PORTABLE CHEST 1 VIEW COMPARISON:  06/08/2017 FINDINGS: 1413 hours. The cardio pericardial silhouette is enlarged. The lungs are clear without focal pneumonia, edema, or pneumothorax. Possible tiny left pleural effusion. Old left clavicle fracture noted. Left permanent pacemaker remains in place. Telemetry leads overlie the chest. IMPRESSION: Cardiomegaly with possible tiny left pleural effusion. Electronically Signed   By: Kennith Center M.D.   On: 06/24/2017 14:43    EKG:   Orders placed or performed during the hospital encounter of 05/30/2017  . EKG 12-Lead  . EKG 12-Lead  . EKG 12-Lead  . EKG 12-Lead    ASSESSMENT AND PLAN:   83 year old male with past medical history significant for atrial fibrillation on Xarelto, diabetes, hypertension, benign prosthetic hypertrophy who recently had a left hip surgery last week was brought in initially secondary to ileus and constipation  #1 acute hypoxic respiratory failure-possible aspiration pneumonia -Intubated on the ventilator, intensivist consult -Continue nebulizer treatments, -On broad-spectrum antibiotics with vancomycin and Zosyn  #2 hypotension-questionable cardiogenic versus septic shock -Check echocardiogram. Continue pressors with Levophed, vasopressin and Neo-Synephrine.   #3 acute encephalopathy-with myoclonic jerks, could have had anoxic brain injury with seizures -Recommend CT head, neurology consult and neuro checks -Agree with Depakote IV  #4 atrial fibrillation-tachycardic and hypotensive, hold  metoprolol - on amiodarone drip -held Xarelto at this time due to possible upper GI bleed  #5 septic shock- elevated lactic acid and also procalcitonin High risk for cardiac arrest and death On meropenem now  #6 ARF- ATN from hypotension- worsening, oliguric now monitor  #7 ileus, nausea and vomiting- on medications. Patient had several bowel movements -Continue to monitor KUB  to rule out obstruction due to constant nausea and vomiting -On IV Protonix, continue OG suction at this time  #8 DVT prophylaxis heparin subcutaneous heparin  Patient is very critically ill at this time.  Worsening with multiorgan failure.    All the records are reviewed and case discussed with Care Management/Social Workerr. Management plans discussed with the patient, family and they are in agreement.  CODE STATUS: Full code  TOTAL TIME TAKING CARE OF THIS PATIENT: .   POSSIBLE D/C IN ? DAYS, DEPENDING ON CLINICAL CONDITION.   Enid Baas M.D on 06/16/2017 at 8:14 AM  Between 7am to 6pm - Pager - [phone]  After 6pm go to [email or web address] - Social research officer, government  [facility]  Office  [phone]  CC: Primary care physician; Lauro Regulus, MD

## 2017-06-16 NOTE — Clinical Social Work Note (Signed)
Clinical Social Work Assessment  Patient Details  Name: Harold Cervantes MRN: [id number] Date of Birth: 02-Aug-1932  Date of referral:  06/16/17               Reason for consult:  Other (Comment Required)(From [employer]. )                Permission sought to share information with:    Permission granted to share information::     Name::        Agency::     Relationship::     Contact Information:     Housing/Transportation Living arrangements for the past 2 months:  [place], [place] of Information:  Adult Children, Spouse Patient Interpreter Needed:  None Criminal Activity/Legal Involvement Pertinent to Current Situation/Hospitalization:  No - Comment as needed Significant Relationships:  Adult Children, Spouse Lives with:  Spouse Do you feel safe going back to the place where you live?    Need for family participation in patient care:  Yes (Comment)  Care giving concerns:  Patient came to [facility] from [facility], where he was at for short term rehab.    Social Worker assessment / plan:  Holiday representative (CSW) reviewed patient's chart and noted that patient is from [employer] Patient is currently on the vent and in ICU. This CSW facilitated patient's D/C to [facility] on 06/11/17. Patient re-admitted to [facility] on 06/08/2017. Per [facility] admissions coordinator at [facility] patient's family did not do a bed hold however if patient is able to continue rehab then he can return to [facility]. CSW met with patient's wife Willis Holquin and patient's 3 sons, and patient's sister in the waiting room. Patient's son Wendie Agreste and his wife Colbie Danner answered the majority of the questions. Per wife patient was having pain at [facility] and came back to [facility]. Per Birdie Hopes patient was vomiting and aspirated and then a code was called. Randell voiced concerns about patient being left alone while he was aspirating. Per wife and Birdie Hopes a  CT scan is ordered for today and will show if patient has brain damage. Per Birdie Hopes the family is in agreement with making patient comfortable and they want to know the results of the CT scan and discuss options with the doctor. CSW provided emotional support. CSW will continue to follow and assist as needed.   Employment status:  Disabled (Comment on whether or not currently receiving Disability) Insurance information:  Managed Medicare PT Recommendations:  Not assessed at this time Information / Referral to community resources:  Other (Comment Required)(Possible comfort care. )  Patient/Family's Response to care:  Patient's family is waiting on CT scan results.   Patient/Family's Understanding of and Emotional Response to Diagnosis, Current Treatment, and Prognosis:  Patient's family understands that patient has a poor prognosis.   Emotional Assessment Appearance:  Appears stated age Attitude/Demeanor/Rapport:  Unable to Assess Affect (typically observed):  Unable to Assess Orientation:  Fluctuating Orientation (Suspected and/or reported Sundowners) Alcohol / Substance use:  Not Applicable Psych involvement (Current and /or in the community):  No (Comment)  Discharge Needs  Concerns to be addressed:  Discharge Planning Concerns Readmission within the last 30 days:  Yes Current discharge risk:  Dependent with Mobility, Cognitively Impaired, Chronically ill Barriers to Discharge:  Continued Medical Work up   [employer], Veronia Beets, LCSW 06/16/2017, 1:29 PM

## 2017-06-16 NOTE — Progress Notes (Signed)
Uneventful day. EEG and CT of the head done today. Neo-synephrine drip up and down. Levophed and Vasopressin remain at maximum drip rates. Late afternoon temperature elevation to 100.9. Cooling blanket turned on to decrease patients temperature.  Family in and out. Patient remains unresponsive to pain or stimulation. Pupils  3 mm in size and nonreactive.

## 2017-06-16 NOTE — Progress Notes (Signed)
*  PRELIMINARY RESULTS* Echocardiogram 2D Echocardiogram has been performed.  Harold Cervantes 06/16/2017, 9:48 AM

## 2017-06-16 NOTE — Progress Notes (Signed)
Rt assisted with patient transport to CT. Patient transported while on trilogy ventilator with no complications.

## 2017-06-16 NOTE — Care Management (Signed)
Per history patient had left hip hemiarthroplasty post fracture of left femoral neck on 11/12 with Dr. Signa KellSunny Patel. [facility]. RNCM assessed patient on 06/08/17 prior to surgery with Dr. Allena KatzPatel.  He originally was from home with his wife and wanted to return to home however rehab at SNF was obtained to meet patient's PT needs.  He was open prior to that [facility]. He has a front-wheeled walker and bedside commode available for use at home. He was chronically on Xarelto per medical record. RNCM will follow.

## 2017-06-16 NOTE — NC FL2 (Signed)
[place] LEVEL OF CARE SCREENING TOOL     IDENTIFICATION  Patient Name: Harold Cervantes Birthdate: January 05, 1933 Sex: male Admission Date (Current Location): 06/10/2017  [place] and [place] Number:  Engineering geologist and Address:  [facility], [address], [place], [place] [address]      Provider Number: [id number]  Attending Physician Name and Address:  Gladstone Lighter, MD  Relative Name and Phone Number:       Current Level of Care: Hospital Recommended Level of Care: [place] Prior Approval Number:    Date Approved/Denied:   PASRR Number: ( [id number] )  Discharge Plan: SNF    Current Diagnoses: Patient Active Problem List   Diagnosis Date Noted  . Cardiac arrest ([place])   . Aspiration pneumonia of right lower lobe due to vomit ([place])   . Aspiration pneumonia of left lower lobe due to vomit ([place])   . Cardiogenic shock ([place])   . Ileus ([place]) 06/26/2017  . Acute respiratory failure requiring reintubation ([place]) 06/26/2017  . Pressure injury of skin 06/18/2017  . Hip fracture ([place]) 06/08/2017  . Skin ulcer of right calf, limited to breakdown of skin ([place]) 05/18/2017  . Diabetes ([place]) 05/30/2016  . Essential hypertension 05/30/2016  . Hyperlipidemia 05/30/2016  . Swelling of limb 05/30/2016  . Lymphedema 05/30/2016  . PVD (peripheral vascular disease) ([place]) 05/30/2016    Orientation RESPIRATION BLADDER Height & Weight        Vent Continent Weight: 220 lb (99.8 kg) Height:  6' (182.9 cm)  BEHAVIORAL SYMPTOMS/MOOD NEUROLOGICAL BOWEL NUTRITION STATUS      Continent Diet(NPO to be advanced)  AMBULATORY STATUS COMMUNICATION OF NEEDS Skin   Extensive Assist Does not communicate Surgical wounds(Incision Left Leg 06/16/17)                       Personal Care Assistance Level of Assistance  Bathing, Feeding, Dressing Bathing Assistance: Limited assistance Feeding assistance: Independent Dressing  Assistance: Limited assistance     Functional Limitations Info  Sight, Hearing, Speech Sight Info: Adequate Hearing Info: Adequate Speech Info: Adequate    SPECIAL CARE FACTORS FREQUENCY  PT (By licensed PT), OT (By licensed OT)     PT Frequency: (5) OT Frequency: (5)            Contractures      Additional Factors Info  Code Status, Allergies, Isolation Precautions Code Status Info: (DNR) Allergies Info: (PIOGLITAZONE, PENICILLINS, ZOLPIDEM )     Isolation Precautions Info: (Nasal Swab)     Current Medications (06/16/2017):  This is the current hospital active medication list Current Facility-Administered Medications  Medication Dose Route Frequency Provider Last Rate Last Dose  . acetaminophen (TYLENOL) suppository 650 mg  650 mg Rectal Q6H PRN Mikael Spray, NP   650 mg at 06/15/17 1819   Or  . acetaminophen (TYLENOL) solution 650 mg  650 mg Per Tube Q6H PRN Dorene Sorrow S, NP      . amiodarone (NEXTERONE PREMIX) 360-4.14 MG/200ML-% (1.8 mg/mL) IV infusion  30 mg/hr Intravenous Continuous Wilhelmina Mcardle, MD 16.7 mL/hr at 06/16/17 0350 30 mg/hr at 06/16/17 0350  . amiodarone (NEXTERONE) 1.8 mg/mL load via infusion 150 mg  150 mg Intravenous Once Wilhelmina Mcardle, MD      . artificial tears (LACRILUBE) ophthalmic ointment   Both Eyes Q8H Wilhelmina Mcardle, MD      . bisacodyl (DULCOLAX) suppository 10 mg  10 mg  Rectal Daily Loletha Grayer, MD   10 mg at 06/15/17 1031  . chlorhexidine gluconate (MEDLINE KIT) (PERIDEX) 0.12 % solution 15 mL  15 mL Mouth Rinse BID Dorene Sorrow S, NP   15 mL at 06/15/17 1947  . heparin injection 5,000 Units  5,000 Units Subcutaneous Q8H Wilhelmina Mcardle, MD   5,000 Units at 06/16/17 0522  . insulin aspart (novoLOG) injection 0-15 Units  0-15 Units Subcutaneous Q4H Dorene Sorrow S, NP   3 Units at 06/16/17 1200  . ipratropium-albuterol (DUONEB) 0.5-2.5 (3) MG/3ML nebulizer solution 3 mL  3 mL Nebulization Q6H Loletha Grayer, MD   3 mL at 06/16/17 0721  . LORazepam (ATIVAN) injection 2 mg  2 mg Intravenous Q1H PRN Wilhelmina Mcardle, MD   2 mg at 06/15/17 1506  . MEDLINE mouth rinse  15 mL Mouth Rinse QID Dorene Sorrow S, NP   15 mL at 06/16/17 1147  . meropenem (MERREM) 1 g in sodium chloride 0.9 % 100 mL IVPB  1 g Intravenous Q8H Awilda Bill, NP   Stopped at 06/16/17 0600  . norepinephrine (LEVOPHED) 16 mg in dextrose 5 % 250 mL (0.064 mg/mL) infusion  0-40 mcg/min Intravenous Titrated Dorene Sorrow S, NP 37.5 mL/hr at 06/16/17 0526 40 mcg/min at 06/16/17 0526  . ondansetron (ZOFRAN) tablet 4 mg  4 mg Per NG tube Q6H PRN Tukov, Magadalene S, NP       Or  . ondansetron (ZOFRAN) injection 4 mg  4 mg Intravenous Q6H PRN Tukov, Magadalene S, NP      . pantoprazole (PROTONIX) injection 40 mg  40 mg Intravenous Daily Tukov, Magadalene S, NP   40 mg at 06/16/17 1159  . phenylephrine (NEO-SYNEPHRINE) 40 mg in sodium chloride 0.9 % 250 mL (0.16 mg/mL) infusion  0-400 mcg/min Intravenous Titrated Dorene Sorrow S, NP 93.8 mL/hr at 06/16/17 1152 250 mcg/min at 06/16/17 1152  . sennosides (SENOKOT) 8.8 MG/5ML syrup 5 mL  5 mL Per Tube BID PRN Dorene Sorrow S, NP      . vasopressin (PITRESSIN) 40 Units in sodium chloride 0.9 % 250 mL (0.16 Units/mL) infusion  0.03 Units/min Intravenous Continuous Wilhelmina Mcardle, MD 11.3 mL/hr at 06/16/17 0909 0.03 Units/min at 06/16/17 0909     Discharge Medications: Please see discharge summary for a list of discharge medications.  Relevant Imaging Results:  Relevant Lab Results:   Additional Information (SSN: [id number])  Smith Mince, Student-Social Work

## 2017-06-17 ENCOUNTER — Ambulatory Visit: Payer: Medicare Other | Admitting: Urology

## 2017-06-17 ENCOUNTER — Inpatient Hospital Stay: Payer: Medicare Other

## 2017-06-17 ENCOUNTER — Ambulatory Visit (INDEPENDENT_AMBULATORY_CARE_PROVIDER_SITE_OTHER): Payer: Medicare Other | Admitting: Vascular Surgery

## 2017-06-17 DIAGNOSIS — R0603 Acute respiratory distress: Secondary | ICD-10-CM

## 2017-06-17 LAB — URINE CULTURE: Culture: 50000 — AB

## 2017-06-17 LAB — GLUCOSE, CAPILLARY
GLUCOSE-CAPILLARY: 136 mg/dL — AB (ref 65–99)
Glucose-Capillary: 167 mg/dL — ABNORMAL HIGH (ref 65–99)
Glucose-Capillary: 153 mg/dL — ABNORMAL HIGH (ref 65–99)
Glucose-Capillary: 181 mg/dL — ABNORMAL HIGH (ref 65–99)

## 2017-06-17 LAB — COMPREHENSIVE METABOLIC PANEL
ALK PHOS: 123 U/L (ref 38–126)
ALT: 24 U/L (ref 17–63)
AST: 90 U/L — AB (ref 15–41)
Albumin: 3.2 g/dL — ABNORMAL LOW (ref 3.5–5.0)
Anion gap: 16 — ABNORMAL HIGH (ref 5–15)
BILIRUBIN TOTAL: 2.7 mg/dL — AB (ref 0.3–1.2)
BUN: 55 mg/dL — AB (ref 6–20)
CALCIUM: 6 mg/dL — AB (ref 8.9–10.3)
CO2: 21 mmol/L — ABNORMAL LOW (ref 22–32)
CREATININE: 3.06 mg/dL — AB (ref 0.61–1.24)
Chloride: 101 mmol/L (ref 101–111)
GFR calc Af Amer: 20 mL/min — ABNORMAL LOW (ref >60)
GFR, EST NON AFRICAN AMERICAN: 18 mL/min — AB (ref >60)
Glucose, Bld: 174 mg/dL — ABNORMAL HIGH (ref 65–99)
POTASSIUM: 4.6 mmol/L (ref 3.5–5.1)
Sodium: 138 mmol/L (ref 135–145)
TOTAL PROTEIN: 5.9 g/dL — AB (ref 6.5–8.1)

## 2017-06-17 LAB — CBC
HCT: 26.3 % — ABNORMAL LOW (ref 40.0–52.0)
HEMOGLOBIN: 8.7 g/dL — AB (ref 13.0–18.0)
MCH: 31.1 pg (ref 26.0–34.0)
MCHC: 33.2 g/dL (ref 32.0–36.0)
MCV: 93.7 fL (ref 80.0–100.0)
PLATELETS: 166 10*3/uL (ref 150–440)
RBC: 2.81 MIL/uL — AB (ref 4.40–5.90)
RDW: 15 % — ABNORMAL HIGH (ref 11.5–14.5)
WBC: 24.2 10*3/uL — ABNORMAL HIGH (ref 3.8–10.6)

## 2017-06-17 LAB — CULTURE, BLOOD (ROUTINE X 2): Special Requests: ADEQUATE

## 2017-06-17 MED ORDER — ALBUMIN HUMAN 25 % IV SOLN
12.5000 g | Freq: Once | INTRAVENOUS | Status: AC
  Administered 2017-06-17: 12.5 g via INTRAVENOUS
  Filled 2017-06-17: qty 50

## 2017-06-17 MED ORDER — SODIUM CHLORIDE 0.9 % IV SOLN
500.0000 mg | Freq: Two times a day (BID) | INTRAVENOUS | Status: DC
  Filled 2017-06-17 (×2): qty 0.5

## 2017-06-17 MED ORDER — FUROSEMIDE 10 MG/ML IJ SOLN
20.0000 mg | Freq: Once | INTRAMUSCULAR | Status: AC
  Administered 2017-06-17: 20 mg via INTRAVENOUS
  Filled 2017-06-17: qty 2

## 2017-06-17 MED ORDER — SODIUM CHLORIDE 0.9 % IV SOLN
1.0000 g | Freq: Once | INTRAVENOUS | Status: AC
  Administered 2017-06-17: 1 g via INTRAVENOUS
  Filled 2017-06-17: qty 10

## 2017-06-17 MED ORDER — DEXTROSE 5 % IV SOLN
2.0000 g | INTRAVENOUS | Status: DC
  Administered 2017-06-17: 2 g via INTRAVENOUS
  Filled 2017-06-17: qty 2

## 2017-06-17 MED ORDER — MORPHINE SULFATE (PF) 4 MG/ML IV SOLN: 5.0000 mg | INTRAVENOUS | Status: DC | PRN

## 2017-06-17 MED ORDER — MORPHINE SULFATE (PF) 4 MG/ML IV SOLN
5.0000 mg | Freq: Once | INTRAVENOUS | Status: AC
  Administered 2017-06-17: 5 mg via INTRAVENOUS
  Filled 2017-06-17: qty 2

## 2017-06-17 MED ORDER — DEXTROSE 5 % IV SOLN: 1.0000 g | INTRAVENOUS | Status: DC

## 2017-06-19 LAB — CULTURE, RESPIRATORY W GRAM STAIN

## 2017-06-19 LAB — CULTURE, RESPIRATORY

## 2017-06-20 LAB — CULTURE, BLOOD (ROUTINE X 2)
CULTURE: NO GROWTH
SPECIAL REQUESTS: ADEQUATE

## 2017-06-22 ENCOUNTER — Telehealth: Payer: Self-pay | Admitting: Urology

## 2017-06-27 NOTE — Progress Notes (Signed)
CH responded to an OR for End of Life. Pt passed shortly after my arrival. Two sons are bedside. CH provided pastoral presence as the son spoke of the Pt and the "road that got him here". Son seemed stable in his grief. Son [facility]    06/11/2017 1300  Clinical Encounter Type  Visited With Family;Health care provider  Visit Type Initial;Spiritual support;Critical Care;Death  Referral From Nurse  Spiritual Encounters  Spiritual Needs Prayer;Emotional

## 2017-06-27 NOTE — Progress Notes (Signed)
Per Dr. Sung AmabileSimonds, due to patient's unresponsiveness we are holding off on doing a spontaneous breathing trial.

## 2017-06-27 NOTE — Progress Notes (Signed)
Notified by ICU RN that patient has been extubated and comfort care was started today afternoon. Patient passed away at 1:15 PM

## 2017-06-27 NOTE — Anesthesia Postprocedure Evaluation (Signed)
Anesthesia Post Note  Patient: Harold Cervantes  Procedure(s) Performed: ARTHROPLASTY BIPOLAR HIP (HEMIARTHROPLASTY) (Left )  Patient location during evaluation: PACU Anesthesia Type: General Level of consciousness: awake and alert Pain management: pain level controlled Vital Signs Assessment: post-procedure vital signs reviewed and stable Respiratory status: spontaneous breathing, nonlabored ventilation, respiratory function stable and patient connected to nasal cannula oxygen Cardiovascular status: blood pressure returned to baseline and stable Postop Assessment: no apparent nausea or vomiting Anesthetic complications: no     Last Vitals:  Vitals:   06/11/17 1502 06/11/17 1725  BP: (!) 121/59 (!) 108/58  Pulse: 99 98  Resp: 16 18  Temp: 36.9 C 37 C  SpO2: 96% 94%    Last Pain:  Vitals:   06/11/17 1502  TempSrc: Oral  PainSc:                  [name]

## 2017-06-27 NOTE — Progress Notes (Signed)
Colin MuldersK. Harris ME notified- due to recent hip fracture

## 2017-06-27 NOTE — Progress Notes (Signed)
Pt extubated to comfort care.  

## 2017-06-27 NOTE — Progress Notes (Addendum)
Patient's time of death was 13:10. Family was at bedside. Chaplain was paged and was at the bedside. Shortly after patient passed, patient family told me they were ready to leave and gave me the name of the funeral services they wanted to use. Supervisor notified. Attending NP and MD notified. [facility] notified. Per Dewayne HatchAnn the nursing supervisor, patient is a medical examiner's case. Patient's foley, iv's and central line and arterial line were kept in place for assessment.

## 2017-06-27 NOTE — Care Management Note (Signed)
Case Management Note  Patient Details  Name: Harold Cervantes MRN: [id number] Date of Birth: 07/26/1933  Subjective/Objective:                    Action/Plan: RNCM reached out to patient's wife as we had met prior to orthopedic surgery on 06/08/17. [name] said that they are tired of watching him suffer and have decided to withdrawing care. I offered my condolences.    Expected Discharge Date:  02-Jul-2017               Expected Discharge Plan:     In-House Referral:     Discharge planning Services     Post Acute Care Choice:    Choice offered to:     DME Arranged:    DME Agency:     HH Arranged:    HH Agency:     Status of Service:     If discussed at [employer], dates discussed:    Additional Comments:  Marshell Garfinkel, RN July 02, 2017, 12:12 PM

## 2017-06-27 NOTE — Progress Notes (Signed)
After speaking with Dr. Sung AmabileSimonds and Dr. Thad Rangereynolds, the patients family has decided to withdraw care. Per Dr. Sung AmabileSimonds, I administered morphine prior to patient's extubation and discontinued his IV medication immediately after pt's extubation. Patient's family was called into the room immediately after extubation.

## 2017-06-27 NOTE — Progress Notes (Signed)
PULMONARY / CRITICAL CARE MEDICINE   Name: Harold Cervantes MRN: [id number] DOB: [birth year]    ADMISSION DATE:  06/08/2017  PT PROFILE:   [age] M with recent L THR admitted 11/18 with abdominal pain due to opioid induced obstipation/ileus. Initially admitted to med-surg floor with cardiac monitoring. Developed N/V, then progressive hypoxemia and cardiac arrest. Underwent 10-15 mins ACLS before ROSC. Intubated and transferred to ICU  MAJOR EVENTS/TEST RESULTS: 11/18 admission as above 11/18 CTAP: Dilated small bowel loops with air-fluid levels. The distal small bowel loop is decompressed without abrupt caliber change. Cannot completely exclude distal small bowel obstruction. However, the right colon and transverse colon are also moderately dilated with fluid and gas. Findings could reflect ileus 11/18 Gen Surgery consultation: No indication for surgery.  Bowel regimen recommended including enemas and suppositories. 11/18 N/V, likely aspiration event, cardiac arrest, ACLS X 10-15 minutes, intubated and transferred to ICU requiring vasopressors 11/19 Moderate myoclonus > attenuated with valproic acid. Max dose vasopressors. Oliguria. Goals of care discussed with family.  Likely poor prognosis conveyed.  DNR established.  Full aggressive support short of ACLS to continue.  No dialysis to be initiated unless improving on all other fronts.  11/20 remains comatose off of all sedatives.  Remains anuric.  Remains on maximum dose of vasopressors. 11/20 CT head: No acute findings 11/20 EEG: Poor quality study due to the tussive artifact 11/21 neurology consultation: Poor prognosis conveyed to patient's family 11/21 family conference: We discussed options and goals of care.  This discussion included the possibility of initiation of CRRT.  Based on the impression offered to them by neurology, they elected for extubation on full comfort measures  INDWELLING DEVICES:: ETT 11/18 >> 11/21 L IJ CVL 11/19 >>  R  femoral A-line 11/19 >>  MICRO DATA: C diff 11/18 >> NEG Urine 11/19 >> 50K E coli Resp 11/19 >> abundant Klebsiella pneumoniae Blood 11/19 >> E. coli  ANTIMICROBIALS:  Vancomycin 11/18 >> 11/19 Pip-tazo 11/18 >> 11/19 Meropenem 11/19 >> 11/22   SUBJECTIVE:  Comatose  VITAL SIGNS: BP (!) 105/57 (BP Location: Other (Comment)) Comment (BP Location): arterial line   Pulse (!) 108   Temp 98.1 F (36.7 C) (Oral)   Resp 20   Ht 6' (1.829 m)   Wt 99.8 kg (220 lb)   SpO2 93%   BMI 29.84 kg/m   HEMODYNAMICS: CVP:  [17 mmHg-20 mmHg] 17 mmHg  VENTILATOR SETTINGS: Vent Mode: PRVC FiO2 (%):  [40 %] 40 % Set Rate:  [18 bmp-189 bmp] 18 bmp Vt Set:  [500 mL] 500 mL PEEP:  [5 cmH20] 5 cmH20 Plateau Pressure:  [13 cmH20-22 cmH20] 22 cmH20  INTAKE / OUTPUT: I/O last 3 completed shifts: In: 7233.8 [I.V.:6968.8; IV Piggyback:265] Out: 1235 [Urine:185; Emesis/NG output:1050]  PHYSICAL EXAMINATION: General: Comatose, spontaneous ventilatory efforts persist Neuro: comatose, pupils 3 mm and equal, no withdrawal HEENT: NCAT, sclerae white Cardiovascular: IRIR, tachy, no M noted Lungs: no wheezes Abdomen: Distended, tympanitic, diminished BS Ext: BLE wrapped, chronic stasis changes  LABS:  BMET Recent Labs  Lab 06/15/17 1640 06/16/17 0346 25-May-2017 0454  NA 138 138 138  K 4.0 4.6 4.6  CL 103 103 101  CO2 22 19* 21*  BUN 37* 44* 55*  CREATININE 2.27* 2.84* 3.06*  GLUCOSE 161* 156* 174*    Electrolytes Recent Labs  Lab 06/11/17 0322  06/15/17 0003  06/15/17 1640 06/16/17 0346 25-May-2017 0454  CALCIUM 7.4*   < >  --    < >  6.8* 6.8* 6.0*  MG 2.0  --  2.4  --   --   --   --   PHOS  --   --  7.2*  --   --   --   --    < > = values in this interval not displayed.    CBC Recent Labs  Lab 06/15/17 0431 06/16/17 0346 12-13-2016 0454  WBC 2.4* 21.8* 24.2*  HGB 10.0* 9.4* 8.7*  HCT 29.0* 28.4* 26.3*  PLT 362 259 166    Coag's No results for input(s): APTT, INR  in the last 168 hours.  Sepsis Markers Recent Labs  Lab 06/15/17 0001 06/15/17 0003 06/15/17 0431 06/16/17 0346  LATICACIDVEN 9.0*  --  7.8*  --   PROCALCITON  --  0.47 28.82 >150.00    ABG Recent Labs  Lab 06/15/17 0015  PHART 7.38  PCO2ART 40  PO2ART 212*    Liver Enzymes Recent Labs  Lab 06/15/17 0001 06/16/17 0346 12-13-2016 0454  AST 52* 80* 90*  ALT 20 22 24   ALKPHOS 79 80 123  BILITOT 1.2 2.4* 2.7*  ALBUMIN 2.1* 1.7* 3.2*    Cardiac Enzymes Recent Labs  Lab 06/15/17 0431 06/15/17 1142 06/16/17 0346  TROPONINI 0.39* 0.31* 0.28*    Glucose Recent Labs  Lab 06/16/17 1649 06/16/17 2027 06/16/17 2359 12-13-2016 0433 12-13-2016 0733 12-13-2016 0746  GLUCAP 155* 141* 136* 167* 153* 181*    CXR: Cardiomegaly, vascular congestion, interstitial prominence, probable right pleural effusion versus infiltrate   ASSESSMENT / PLAN:  PULMONARY Recent total hip replacement Admitted with abdominal pain, severe obstipation due to opioids Nausea/vomiting with aspiration pneumonia and cardiac arrest Acute respiratory failure with hypoxemia Aspiration pneumonia/pneumonitis Severe sepsis Aspiration pneumonia E. coli bacteremia -unclear source, likely urinary tract Chronic atrial fibrillation with RVR S/P cardiac arrest - Likely due to aspiration event Cardiogenic shock Septic shock -on maximum doses of vasopressors Anuric AKI/ATN Persistent abdominal distention Leukopenia -resolved Severe sepsis Aspiration pneumonia E. coli bacteremia -unclear source, likely urinary tract DM II Severe post anoxic encephalopathy Severe myoclonus -resolved P:   Terminal extubation and full comfort care   Billy Fischeravid Sapna Padron, MD PCCM service Mobile [phone] Pager [phone]    Mar 17, 2017, 3:08 PM

## 2017-06-27 NOTE — Consult Note (Signed)
Reason for Consult:Unresponsive s/p arrest Referring Physician: Nemiah CommanderKalisetti  CC: Unresponsive  HPI: Harold EisenmengerClarence F Cervantes is an 83 y.o. male s/p recent hip fracture, history of afib who presented with constipation and ileus.  Developed nausea and vomiting on 11/18 then experienced arrest.  10-15 minutes until ROSC requiring 3 doses of epinephrine.  Now intubated.  Off sedation for the past 2 days.  Remains unresponsive.  Past Medical History:  Diagnosis Date  . A-fib (HCC)   . BPH (benign prostatic hyperplasia)   . Diabetes mellitus without complication (HCC)   . Hypertension     Past Surgical History:  Procedure Laterality Date  . HIP ARTHROPLASTY Left 06/08/2017   Procedure: ARTHROPLASTY BIPOLAR HIP (HEMIARTHROPLASTY);  Surgeon: Signa KellPatel, Sunny, MD;  Location: [facility] ORS;  Service: Orthopedics;  Laterality: Left;  . JOINT REPLACEMENT    . PACEMAKER INSERTION    . TOE AMPUTATION Left   . TOE AMPUTATION Left     Family History  Problem Relation Age of Onset  . Cancer Mother   . Cancer Father   . Cancer Sister     Social History:  reports that he has quit smoking. he has never used smokeless tobacco. He reports that he does not drink alcohol or use drugs.  Allergies  Allergen Reactions  . Pioglitazone Swelling  . Penicillins Rash  . Zolpidem Other (See Comments)    confusion    Medications:  I have reviewed the patient's current medications. Prior to Admission:  Medications Prior to Admission  Medication Sig Dispense Refill Last Dose  . atorvastatin (LIPITOR) 40 MG tablet Take 40 mg by mouth daily.   06/13/2017 at 1600  . bisacodyl (DULCOLAX) 5 MG EC tablet Take 15 mg every 3 (three) days by mouth.   06/13/2017 at 0800  . carbamazepine (CARBATROL) 200 MG 12 hr capsule Take 200 mg 2 (two) times daily by mouth.   1 August 13, 2016 at 0800  . CARTIA XT 180 MG 24 hr capsule Take 1 capsule by mouth daily  1 August 13, 2016 at 0800  . docusate sodium (COLACE) 100 MG capsule Take 1 capsule  (100 mg total) 2 (two) times daily by mouth. 10 capsule 0 August 13, 2016 at 0800  . gabapentin (NEURONTIN) 300 MG capsule Take 300 mg 2 (two) times daily by mouth.    August 13, 2016 at 0800  . glipiZIDE (GLUCOTROL) 5 MG tablet Take 5 mg 2 (two) times daily before a meal by mouth.   0 August 13, 2016 at 0800  . magnesium hydroxide (MILK OF MAGNESIA) 400 MG/5ML suspension Take 30 mLs every 4 (four) hours as needed by mouth for mild constipation.   August 13, 2016 at 0800  . magnesium oxide (MAG-OX) 400 MG tablet TAKE 1 TABLET BY MOUTH TWICE DAILY   August 13, 2016 at 0800  . metFORMIN (GLUCOPHAGE-XR) 500 MG 24 hr tablet Take 500 mg daily by mouth.    August 13, 2016 at 0800  . metolazone (ZAROXOLYN) 2.5 MG tablet Take 2.5 mg daily by mouth.   2 August 13, 2016 at 0800  . metoprolol tartrate (LOPRESSOR) 25 MG tablet Take 25 mg 2 (two) times daily by mouth.   August 13, 2016 at 0800  . oxyCODONE (OXY IR/ROXICODONE) 5 MG immediate release tablet Take 1 tablet (5 mg total) See admin instructions by mouth. Take 1 tab (5 mg) by mouth every 4 hours prn for mild pain and take 2 tabs (10 mg) by mouth every 4 hours prn for moderate to severe pain 120 tablet 0 August 13, 2016 at 0800  . polyethylene glycol (MIRALAX / GLYCOLAX)  packet Take 17 g daily by mouth. 14 each 0 06/04/2017 at 0800  . potassium chloride SA (K-DUR,KLOR-CON) 20 MEQ tablet Take 1 tablet (20 mEq total) daily by mouth. (Patient taking differently: Take 10 mEq daily by mouth. ) 30 tablet 0 06/01/2017 at 0800  . senna (SENOKOT) 8.6 MG tablet Take 1 tablet 2 (two) times daily by mouth.   06/09/2017 at 0800  . silodosin (RAPAFLO) 8 MG CAPS capsule Take 8 mg daily with breakfast by mouth.   06/05/2017 at 0800  . spironolactone (ALDACTONE) 25 MG tablet Take 25 mg daily by mouth.   06/16/2017 at 0800  . torsemide (DEMADEX) 5 MG tablet Take 1 tablet (5 mg total) daily by mouth. (Patient taking differently: Take 5 mg 3 (three) times daily as needed by mouth. ) 20 tablet 0 06/15/2017 at 0800  .  XARELTO 15 MG TABS tablet Take one tablet by mouth daily  0 06/02/2017 at 0800  . ACCU-CHEK SOFTCLIX LANCETS lancets U UTD BID  0 UTD at UTD  . acetaminophen (TYLENOL) 325 MG tablet Take 2 tablets (650 mg total) every 4 (four) hours as needed by mouth for mild pain or fever ((score 1 to 3) or temp > 100.5). 30 tablet 0 PRN at PRN  . tamsulosin (FLOMAX) 0.4 MG CAPS capsule Take 1 capsule (0.4 mg total) daily by mouth. (Patient not taking: Reported on 06/16/2017) 30 capsule 0 Not Taking at 1030   Scheduled: .  morphine injection  5 mg Intravenous Once    ROS: Unable to provide  Physical Examination: Blood pressure (!) 105/57, pulse (!) 106, temperature 98.1 F (36.7 C), temperature source Oral, resp. rate (!) 28, height 6' (1.829 m), weight 99.8 kg (220 lb), SpO2 94 %.  HEENT-  Normocephalic, no lesions, without obvious abnormality.  Normal external eye and conjunctiva.  Normal TM's bilaterally.  Normal auditory canals and external ears. Normal external nose, mucus membranes and septum.  Normal pharynx. Cardiovascular- S1, S2 normal, pulses palpable throughout   Lungs- decreased breath sounds  Abdomen- soft, non-tender; bowel sounds normal; no masses,  no organomegaly Extremities- no edema Lymph-no adenopathy palpable Musculoskeletal-no joint tenderness, deformity or swelling Skin-warm and dry, no hyperpigmentation, vitiligo, or suspicious lesions  Neurological Examination   Mental Status: Patient does not respond to verbal stimuli.  Does not respond to deep sternal rub.  Does not follow commands.  No verbalizations noted.  Cranial Nerves: II: patient does not respond confrontation bilaterally, pupils right 3 mm, left 3 mm,and unreactive bilaterally III,IV,VI: doll's response absent bilaterally.  V,VII: corneal reflex absent bilaterally  VIII: patient does not respond to verbal stimuli IX,X: gag reflex absent, XI: trapezius strength unable to test bilaterally XII: tongue strength  unable to test Motor: Extremities flaccid throughout.  No spontaneous movement noted.  No purposeful movements noted. Sensory: Does not respond to noxious stimuli in any extremity. Deep Tendon Reflexes:  2+ in the upper extremities, absent in the lower extremities Plantars: Mute bilaterally Cerebellar: Unable to perform   Laboratory Studies:   Basic Metabolic Panel: Recent Labs  Lab 06/11/17 0322  06/15/17 0001 06/15/17 0003 06/15/17 0431 06/15/17 1640 06/16/17 0346 07-02-17 0454  NA 133*   < > 136  --  137 138 138 138  K 4.6   < > 3.5  --  2.8* 4.0 4.6 4.6  CL 104   < > 94*  --  103 103 103 101  CO2 22   < > 23  --  23  22 19* 21*  GLUCOSE 192*   < > 284*  --  140* 161* 156* 174*  BUN 17   < > 29*  --  31* 37* 44* 55*  CREATININE 1.01   < > 1.59*  --  1.74* 2.27* 2.84* 3.06*  CALCIUM 7.4*   < > 8.1*  --  6.6* 6.8* 6.8* 6.0*  MG 2.0  --   --  2.4  --   --   --   --   PHOS  --   --   --  7.2*  --   --   --   --    < > = values in this interval not displayed.    Liver Function Tests: Recent Labs  Lab 2017-07-13 0940 06/15/17 0001 06/16/17 0346 06/10/2017 0454  AST 29 52* 80* 90*  ALT 18 20 22 24   ALKPHOS 98 79 80 123  BILITOT 1.6* 1.2 2.4* 2.7*  PROT 6.0* 5.4* 4.7* 5.9*  ALBUMIN 2.5* 2.1* 1.7* 3.2*   Recent Labs  Lab Jul 13, 2017 0940  LIPASE 83*   Recent Labs  Lab 06/15/17 0001  AMMONIA 20    CBC: Recent Labs  Lab 07-13-2017 0940 06/15/17 0001 06/15/17 0431 06/16/17 0346 06/15/2017 0454  WBC 10.3 9.1 2.4* 21.8* 24.2*  HGB 11.8* 10.6* 10.0* 9.4* 8.7*  HCT 33.2* 31.2* 29.0* 28.4* 26.3*  MCV 90.7 93.4 92.8 94.6 93.7  PLT 351 381 362 259 166    Cardiac Enzymes: Recent Labs  Lab 06/15/17 0001 06/15/17 0431 06/15/17 1142 06/16/17 0346  TROPONINI 0.03* 0.39* 0.31* 0.28*    BNP: Invalid input(s): POCBNP  CBG: Recent Labs  Lab 06/16/17 2027 06/16/17 2359 06/25/2017 0433 06/08/2017 0733 06/18/2017 0746  GLUCAP 141* 136* 167* 153* 181*     Microbiology: Results for orders placed or performed during the hospital encounter of 07/13/2017  C difficile quick scan w PCR reflex     Status: None   Collection Time: 13-Jul-2017 11:05 AM  Result Value Ref Range Status   C Diff antigen NEGATIVE NEGATIVE Final   C Diff toxin NEGATIVE NEGATIVE Final   C Diff interpretation No C. difficile detected.  Final  Culture, blood (Routine X 2) w Reflex to ID Panel     Status: Abnormal   Collection Time: 06/15/17 12:02 AM  Result Value Ref Range Status   Specimen Description BLOOD RIGHT ANTECUBITAL  Final   Special Requests   Final    BOTTLES DRAWN AEROBIC AND ANAEROBIC Blood Culture adequate volume   Culture  Setup Time   Final    AEROBIC BOTTLE ONLY GRAM NEGATIVE RODS CRITICAL RESULT CALLED TO, READ BACK BY AND VERIFIED WITH: JASON ROBBINS ON 06/15/17 AT 1616 [facility]    Culture ESCHERICHIA COLI (A)  Final   Report Status 06/23/2017 FINAL  Final   Organism ID, Bacteria ESCHERICHIA COLI  Final      Susceptibility   Escherichia coli - MIC*    AMPICILLIN <=2 SENSITIVE Sensitive     CEFAZOLIN <=4 SENSITIVE Sensitive     CEFEPIME <=1 SENSITIVE Sensitive     CEFTAZIDIME <=1 SENSITIVE Sensitive     CEFTRIAXONE <=1 SENSITIVE Sensitive     CIPROFLOXACIN <=0.25 SENSITIVE Sensitive     GENTAMICIN >=16 RESISTANT Resistant     IMIPENEM <=0.25 SENSITIVE Sensitive     TRIMETH/SULFA <=20 SENSITIVE Sensitive     AMPICILLIN/SULBACTAM <=2 SENSITIVE Sensitive     PIP/TAZO <=4 SENSITIVE Sensitive     Extended ESBL NEGATIVE Sensitive     *  ESCHERICHIA COLI  Culture, blood (Routine X 2) w Reflex to ID Panel     Status: None (Preliminary result)   Collection Time: 06/15/17 12:02 AM  Result Value Ref Range Status   Specimen Description BLOOD RIGHT HAND  Final   Special Requests   Final    BOTTLES DRAWN AEROBIC AND ANAEROBIC Blood Culture adequate volume   Culture NO GROWTH 2 DAYS  Final   Report Status PENDING  Incomplete  Blood Culture ID Panel (Reflexed)      Status: Abnormal   Collection Time: 06/15/17 12:02 AM  Result Value Ref Range Status   Enterococcus species NOT DETECTED NOT DETECTED Final   Listeria monocytogenes NOT DETECTED NOT DETECTED Final   Staphylococcus species NOT DETECTED NOT DETECTED Final   Staphylococcus aureus NOT DETECTED NOT DETECTED Final   Streptococcus species NOT DETECTED NOT DETECTED Final   Streptococcus agalactiae NOT DETECTED NOT DETECTED Final   Streptococcus pneumoniae NOT DETECTED NOT DETECTED Final   Streptococcus pyogenes NOT DETECTED NOT DETECTED Final   Acinetobacter baumannii NOT DETECTED NOT DETECTED Final   Enterobacteriaceae species DETECTED (A) NOT DETECTED Final    Comment: Enterobacteriaceae represent a large family of gram-negative bacteria, not a single organism. CRITICAL RESULT CALLED TO, READ BACK BY AND VERIFIED WITH: JASON ROBBINS ON 06/15/17 AT 1616 [facility]    Enterobacter cloacae complex NOT DETECTED NOT DETECTED Final   Escherichia coli DETECTED (A) NOT DETECTED Final    Comment: CRITICAL RESULT CALLED TO, READ BACK BY AND VERIFIED WITH: JASON ROBBINS ON 06/15/17 AT 1616 [facility]    Klebsiella oxytoca NOT DETECTED NOT DETECTED Final   Klebsiella pneumoniae NOT DETECTED NOT DETECTED Final   Proteus species NOT DETECTED NOT DETECTED Final   Serratia marcescens NOT DETECTED NOT DETECTED Final   Carbapenem resistance NOT DETECTED NOT DETECTED Final   Haemophilus influenzae NOT DETECTED NOT DETECTED Final   Neisseria meningitidis NOT DETECTED NOT DETECTED Final   Pseudomonas aeruginosa NOT DETECTED NOT DETECTED Final   Candida albicans NOT DETECTED NOT DETECTED Final   Candida glabrata NOT DETECTED NOT DETECTED Final   Candida krusei NOT DETECTED NOT DETECTED Final   Candida parapsilosis NOT DETECTED NOT DETECTED Final   Candida tropicalis NOT DETECTED NOT DETECTED Final  Urine Culture     Status: Abnormal   Collection Time: 06/15/17  1:57 AM  Result Value Ref Range Status   Specimen  Description URINE, RANDOM  Final   Special Requests NONE  Final   Culture 50,000 COLONIES/mL ESCHERICHIA COLI (A)  Final   Report Status 06/18/17 FINAL  Final   Organism ID, Bacteria ESCHERICHIA COLI (A)  Final      Susceptibility   Escherichia coli - MIC*    AMPICILLIN <=2 SENSITIVE Sensitive     CEFAZOLIN <=4 SENSITIVE Sensitive     CEFTRIAXONE <=1 SENSITIVE Sensitive     CIPROFLOXACIN <=0.25 SENSITIVE Sensitive     GENTAMICIN >=16 RESISTANT Resistant     IMIPENEM <=0.25 SENSITIVE Sensitive     NITROFURANTOIN <=16 SENSITIVE Sensitive     TRIMETH/SULFA <=20 SENSITIVE Sensitive     AMPICILLIN/SULBACTAM <=2 SENSITIVE Sensitive     PIP/TAZO <=4 SENSITIVE Sensitive     Extended ESBL NEGATIVE Sensitive     * 50,000 COLONIES/mL ESCHERICHIA COLI  Culture, respiratory (NON-Expectorated)     Status: None (Preliminary result)   Collection Time: 06/15/17  3:19 AM  Result Value Ref Range Status   Specimen Description TRACHEAL ASPIRATE  Final   Special Requests NONE  Final   Gram Stain   Final    FEW WBC PRESENT,BOTH PMN AND MONONUCLEAR FEW SQUAMOUS EPITHELIAL CELLS PRESENT ABUNDANT GRAM NEGATIVE RODS FEW GRAM POSITIVE COCCI    Culture   Final    CULTURE REINCUBATED FOR BETTER GROWTH Performed at [facility], [address] [place], [place] [address]    Report Status PENDING  Incomplete    Coagulation Studies: No results for input(s): LABPROT, INR in the last 72 hours.  Urinalysis:  Recent Labs  Lab 06/11/2017 0930  COLORURINE AMBER*  LABSPEC 1.021  PHURINE 5.0  GLUCOSEU NEGATIVE  HGBUR MODERATE*  BILIRUBINUR NEGATIVE  KETONESUR 5*  PROTEINUR 30*  NITRITE NEGATIVE  LEUKOCYTESUR NEGATIVE    Lipid Panel:  No results found for: CHOL, TRIG, HDL, CHOLHDL, VLDL, LDLCALC  HgbA1C: No results found for: HGBA1C  Urine Drug Screen:      Component Value Date/Time   LABOPIA NEGATIVE 02/26/2012 2057   COCAINSCRNUR NEGATIVE 02/26/2012 2057   LABBENZ NEGATIVE 02/26/2012  2057   AMPHETMU NEGATIVE 02/26/2012 2057   THCU NEGATIVE 02/26/2012 2057   LABBARB NEGATIVE 02/26/2012 2057    Alcohol Level: No results for input(s): ETH in the last 168 hours.  Other results: EKG: atrial fibrillation, rate 115 bpm.  Imaging: Ct Head Wo Contrast  Result Date: 06/16/2017 CLINICAL DATA:  Altered mental status after cardiac arrest. EXAM: CT HEAD WITHOUT CONTRAST TECHNIQUE: Contiguous axial images were obtained from the base of the skull through the vertex without intravenous contrast. COMPARISON:  CT head dated February 26, 2012. FINDINGS: Brain: No evidence of acute infarction, hemorrhage, hydrocephalus, extra-axial collection or mass lesion/mass effect. Stable moderate cerebral atrophy. Vascular: No hyperdense vessel or unexpected calcification. Skull: Normal. Negative for fracture or focal lesion. Sinuses/Orbits: Tiny air-fluid level in the right maxillary sinus. Mild left maxillary mucosal thickening. The orbits are unremarkable. Other: None. IMPRESSION: No acute intracranial abnormality. Electronically Signed   By: Obie Dredge M.D.   On: 06/16/2017 15:53   Dg Chest Port 1 View  Result Date: 07/10/2017 CLINICAL DATA:  Respiratory failure EXAM: PORTABLE CHEST 1 VIEW COMPARISON:  06/16/2017 FINDINGS: Cardiac pacemaker. Endotracheal tube with tip measuring 3.7 cm above the carina. Left central venous catheter with tip over the low SVC region. Enteric tube tip is off the field of view but below the left hemidiaphragm. Cardiac enlargement. Pulmonary vascular congestion is improving. Infiltration in the right lung base with small right pleural effusion likely to represent pneumonia. No pneumothorax. Calcification of the aorta. IMPRESSION: Appliances appear in satisfactory position. Infiltration in the right lower lung with small effusion likely representing pneumonia. Cardiac enlargement. Aortic atherosclerosis. Electronically Signed   By: Burman Nieves M.D.   On: 2017-07-10 02:40    Dg Chest Port 1 View  Result Date: 06/16/2017 CLINICAL DATA:  Respiratory failure EXAM: PORTABLE CHEST 1 VIEW COMPARISON:  1 day prior FINDINGS: Endotracheal tube terminates 3.7 cm above carina.Left-sided internal jugular line tip at high SVC. The nasogastric tube terminates at the body of the stomach. Single lead pacer. Midline trachea. Moderate cardiomegaly. Suspect small layering bilateral pleural effusions. No pneumothorax. Mild pulmonary venous congestion is not significantly changed. Bibasilar airspace disease. IMPRESSION: Given differences in technique, no significant change. Cardiomegaly with pulmonary venous congestion, pleural effusions, and bibasilar atelectasis. Electronically Signed   By: Jeronimo Greaves M.D.   On: 06/16/2017 08:01     Assessment/Plan: 83 year old male unresponsive s/p arrest despite discontinuation of sedation.  Head CT reviewed and shows no acute  changes.  EEG with significant artifact but there does not appear to be significant underlying background obscured by the artifact.  Patient at one point with some myoclonic activity that has resolved on Depakote. Pupils fixed but patient does have respiratory drive so not considered brain dead clinically.  Has multiple other comorbidities and if treatment to continue will require CRRT.  Discussion had with family concerning current condition.  Family concerned that he would not want to be put through more due to general debility prior to this hospitalization.  Repeat EEG and MRI of the brain offered for further prognostication but family declines at this time and is ready for extubation.   Wishes conveyed to ICU staff.    Thank you for allowing me to help care for this patient.   Thana FarrLeslie Tommie Dejoseph, MD Neurology [phone] 06/15/2017, 11:38 AM

## 2017-06-27 NOTE — Discharge Summary (Signed)
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 coli bacteremia  Chronic atrial fibrillation with RVR Cardiogenic shock Septic shock Anuric AKI/ATN Leukopenia -resolved Severe post anoxic encephalopathy Severe myoclonus -resolved    PRESENTATION:   Pt was admitted with the following HPI and the above admission diagnoses:  Harold Cervantes  is a 83 y.o. male with a known history of recent hip fracture.  He had one bowel movement prior to leaving the hospital but did not have another bowel movement at the rehab.  He complains of abdominal pain severe in nature.  Some nausea.  He complains of his abdomen is very distended.  Pain is severe 10 out of 10 intensity.  No radiation just stays in his abdomen.  He stated he started wheezing a little bit after drinking the CT scan contrast.  Still having a lot of pain in his left hip and unable to walk at this point.  In the ER, he had a CT scan of the abdomen that was concerning for ileus.  Hospitalist services were contacted for further evaluation.    HOSPITAL COURSE:   MAJOR EVENTS/TEST RESULTS: 11/18 admission as above 11/18 CTAP: Dilated small bowel loops with air-fluid levels. The distal small bowel loop is decompressed without abrupt caliber change. Cannot completely exclude distal small bowel obstruction. However, the right colon and transverse colon are also moderately dilated with fluid and gas. Findings could reflect ileus 11/18 Gen Surgery consultation: No indication for surgery.  Bowel regimen recommended  including enemas and suppositories. 11/18 N/V, aspiration event, cardiac arrest, ACLS X 10-15 minutes, intubated and transferred to ICU requiring vasopressors 11/19 Moderate myoclonus > attenuated with valproic acid. Max dose vasopressors. Oliguria. Goals of care discussed with family.  Likely poor prognosis conveyed.  DNR established.  Full aggressive support short of ACLS to continue.  No dialysis to be initiated unless improving on all other fronts.  11/20 remained comatose off of all sedatives.  Remained anuric.  Remained on maximum dose of vasopressors. 11/20 CT head: No acute findings 11/20 EEG: Poor quality study due to artifact 11/21 neurology consultation: Poor prognosis conveyed to patient's family 11/21 family conference: Discussed options and goals of care.  Discussion included the possibility of initiation of CRRT.  Based on the impression offered to them by Neurology and based on pt's previously expressed wishes, they elected for extubation on full comfort measures 11/21 Terminal extubation   Harold Fischeravid Simonds, MD PCCM service Mobile [phone] Pager [phone] 06/25/2017 3:16 PM

## 2017-06-27 DEATH — deceased

## 2017-09-02 ENCOUNTER — Telehealth: Payer: Self-pay | Admitting: Urology

## 2017-09-02 NOTE — Telephone Encounter (Signed)
error 

## 2017-09-02 NOTE — Telephone Encounter (Signed)
Erroneous

## 2018-05-28 ENCOUNTER — Ambulatory Visit (INDEPENDENT_AMBULATORY_CARE_PROVIDER_SITE_OTHER): Payer: Medicare Other | Admitting: Vascular Surgery

## 2018-05-28 ENCOUNTER — Encounter (INDEPENDENT_AMBULATORY_CARE_PROVIDER_SITE_OTHER): Payer: Medicare Other

## 2018-05-31 ENCOUNTER — Ambulatory Visit: Payer: Medicare Other | Admitting: Urology
